# Patient Record
Sex: Female | Born: 1955
Health system: Southern US, Community
[De-identification: ages and names within clinical notes are randomized; demographics above are authoritative.]

## PROBLEM LIST (undated history)

## (undated) DIAGNOSIS — Z9889 Other specified postprocedural states: Secondary | ICD-10-CM

## (undated) DIAGNOSIS — M1990 Unspecified osteoarthritis, unspecified site: Secondary | ICD-10-CM

## (undated) DIAGNOSIS — D649 Anemia, unspecified: Secondary | ICD-10-CM

## (undated) DIAGNOSIS — E785 Hyperlipidemia, unspecified: Secondary | ICD-10-CM

## (undated) DIAGNOSIS — E079 Disorder of thyroid, unspecified: Secondary | ICD-10-CM

## (undated) DIAGNOSIS — G2581 Restless legs syndrome: Secondary | ICD-10-CM

## (undated) DIAGNOSIS — F329 Major depressive disorder, single episode, unspecified: Secondary | ICD-10-CM

## (undated) DIAGNOSIS — K219 Gastro-esophageal reflux disease without esophagitis: Secondary | ICD-10-CM

## (undated) DIAGNOSIS — M797 Fibromyalgia: Secondary | ICD-10-CM

## (undated) DIAGNOSIS — R51 Headache: Secondary | ICD-10-CM

## (undated) DIAGNOSIS — R112 Nausea with vomiting, unspecified: Secondary | ICD-10-CM

## (undated) DIAGNOSIS — F32A Depression, unspecified: Secondary | ICD-10-CM

## (undated) DIAGNOSIS — T7840XA Allergy, unspecified, initial encounter: Secondary | ICD-10-CM

## (undated) DIAGNOSIS — F419 Anxiety disorder, unspecified: Secondary | ICD-10-CM

## (undated) DIAGNOSIS — I10 Essential (primary) hypertension: Secondary | ICD-10-CM

## (undated) HISTORY — PX: OTHER SURGICAL HISTORY: SHX169

## (undated) HISTORY — PX: BLADDER SURGERY: SHX569

## (undated) HISTORY — DX: Gastro-esophageal reflux disease without esophagitis: K21.9

## (undated) HISTORY — PX: WRIST SURGERY: SHX841

## (undated) HISTORY — PX: JOINT REPLACEMENT: SHX530

## (undated) HISTORY — DX: Hyperlipidemia, unspecified: E78.5

## (undated) HISTORY — PX: SHOULDER SURGERY: SHX246

## (undated) HISTORY — DX: Disorder of thyroid, unspecified: E07.9

## (undated) HISTORY — PX: OOPHORECTOMY: SHX86

## (undated) HISTORY — DX: Allergy, unspecified, initial encounter: T78.40XA

## (undated) HISTORY — DX: Anxiety disorder, unspecified: F41.9

---
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
## 1984-06-14 HISTORY — PX: ABDOMINAL HYSTERECTOMY: SHX81

## 1992-02-26 DIAGNOSIS — K2980 Duodenitis without bleeding: Secondary | ICD-10-CM | POA: Insufficient documentation

## 1998-06-14 HISTORY — PX: KNEE ARTHROSCOPY: SUR90

## 2001-07-24 ENCOUNTER — Encounter: Admission: RE | Admit: 2001-07-24 | Discharge: 2001-07-24 | Payer: Self-pay | Admitting: Family Medicine

## 2001-07-24 ENCOUNTER — Encounter: Payer: Self-pay | Admitting: Family Medicine

## 2004-01-15 ENCOUNTER — Other Ambulatory Visit: Admission: RE | Admit: 2004-01-15 | Discharge: 2004-01-15 | Payer: Self-pay | Admitting: Internal Medicine

## 2004-06-22 ENCOUNTER — Encounter (INDEPENDENT_AMBULATORY_CARE_PROVIDER_SITE_OTHER): Payer: Self-pay | Admitting: *Deleted

## 2004-06-22 ENCOUNTER — Encounter: Admission: RE | Admit: 2004-06-22 | Discharge: 2004-06-22 | Payer: Self-pay | Admitting: Surgery

## 2004-06-23 HISTORY — PX: BREAST BIOPSY: SHX20

## 2004-12-01 ENCOUNTER — Emergency Department (HOSPITAL_COMMUNITY): Admission: EM | Admit: 2004-12-01 | Discharge: 2004-12-01 | Payer: Self-pay | Admitting: Emergency Medicine

## 2005-07-06 ENCOUNTER — Encounter: Admission: RE | Admit: 2005-07-06 | Discharge: 2005-07-06 | Payer: Self-pay | Admitting: Internal Medicine

## 2005-12-22 ENCOUNTER — Encounter: Admission: RE | Admit: 2005-12-22 | Discharge: 2005-12-22 | Payer: Self-pay | Admitting: Family Medicine

## 2006-04-19 ENCOUNTER — Encounter: Admission: RE | Admit: 2006-04-19 | Discharge: 2006-04-19 | Payer: Self-pay | Admitting: Family Medicine

## 2006-07-07 ENCOUNTER — Encounter: Admission: RE | Admit: 2006-07-07 | Discharge: 2006-07-07 | Payer: Self-pay | Admitting: Family Medicine

## 2006-07-25 ENCOUNTER — Encounter: Admission: RE | Admit: 2006-07-25 | Discharge: 2006-07-25 | Payer: Self-pay | Admitting: Family Medicine

## 2006-08-11 ENCOUNTER — Ambulatory Visit: Payer: Self-pay | Admitting: Gastroenterology

## 2006-08-25 ENCOUNTER — Ambulatory Visit: Payer: Self-pay | Admitting: Gastroenterology

## 2006-08-25 ENCOUNTER — Encounter (INDEPENDENT_AMBULATORY_CARE_PROVIDER_SITE_OTHER): Payer: Self-pay | Admitting: *Deleted

## 2006-09-05 ENCOUNTER — Ambulatory Visit: Payer: Self-pay | Admitting: Gastroenterology

## 2006-09-20 ENCOUNTER — Encounter (INDEPENDENT_AMBULATORY_CARE_PROVIDER_SITE_OTHER): Payer: Self-pay | Admitting: Specialist

## 2006-09-20 ENCOUNTER — Ambulatory Visit: Payer: Self-pay | Admitting: Gastroenterology

## 2006-10-25 ENCOUNTER — Ambulatory Visit: Payer: Self-pay | Admitting: Gastroenterology

## 2007-02-20 ENCOUNTER — Encounter: Admission: RE | Admit: 2007-02-20 | Discharge: 2007-02-20 | Payer: Self-pay | Admitting: Family Medicine

## 2007-05-22 ENCOUNTER — Ambulatory Visit: Payer: Self-pay | Admitting: Gastroenterology

## 2007-06-29 DIAGNOSIS — J309 Allergic rhinitis, unspecified: Secondary | ICD-10-CM | POA: Insufficient documentation

## 2007-06-29 DIAGNOSIS — R51 Headache: Secondary | ICD-10-CM | POA: Insufficient documentation

## 2007-06-29 DIAGNOSIS — K219 Gastro-esophageal reflux disease without esophagitis: Secondary | ICD-10-CM | POA: Insufficient documentation

## 2007-06-29 DIAGNOSIS — K449 Diaphragmatic hernia without obstruction or gangrene: Secondary | ICD-10-CM | POA: Insufficient documentation

## 2007-06-29 DIAGNOSIS — R519 Headache, unspecified: Secondary | ICD-10-CM | POA: Insufficient documentation

## 2007-06-29 DIAGNOSIS — I10 Essential (primary) hypertension: Secondary | ICD-10-CM | POA: Insufficient documentation

## 2007-06-29 DIAGNOSIS — K2990 Gastroduodenitis, unspecified, without bleeding: Secondary | ICD-10-CM

## 2007-06-29 DIAGNOSIS — F329 Major depressive disorder, single episode, unspecified: Secondary | ICD-10-CM | POA: Insufficient documentation

## 2007-06-29 DIAGNOSIS — M159 Polyosteoarthritis, unspecified: Secondary | ICD-10-CM | POA: Insufficient documentation

## 2007-06-29 DIAGNOSIS — K2970 Gastritis, unspecified, without bleeding: Secondary | ICD-10-CM | POA: Insufficient documentation

## 2007-06-29 DIAGNOSIS — K644 Residual hemorrhoidal skin tags: Secondary | ICD-10-CM | POA: Insufficient documentation

## 2007-06-29 DIAGNOSIS — E1159 Type 2 diabetes mellitus with other circulatory complications: Secondary | ICD-10-CM | POA: Insufficient documentation

## 2007-06-29 DIAGNOSIS — K589 Irritable bowel syndrome without diarrhea: Secondary | ICD-10-CM | POA: Insufficient documentation

## 2007-07-10 ENCOUNTER — Encounter: Admission: RE | Admit: 2007-07-10 | Discharge: 2007-07-10 | Payer: Self-pay | Admitting: Family Medicine

## 2007-12-13 ENCOUNTER — Emergency Department (HOSPITAL_COMMUNITY): Admission: EM | Admit: 2007-12-13 | Discharge: 2007-12-13 | Payer: Self-pay | Admitting: Physician Assistant

## 2008-05-29 DIAGNOSIS — Z860100 Personal history of colon polyps, unspecified: Secondary | ICD-10-CM | POA: Insufficient documentation

## 2008-05-29 DIAGNOSIS — M1990 Unspecified osteoarthritis, unspecified site: Secondary | ICD-10-CM | POA: Insufficient documentation

## 2008-05-30 ENCOUNTER — Ambulatory Visit: Payer: Self-pay | Admitting: Gastroenterology

## 2008-06-24 ENCOUNTER — Ambulatory Visit: Payer: Self-pay | Admitting: Family Medicine

## 2008-06-24 DIAGNOSIS — E1129 Type 2 diabetes mellitus with other diabetic kidney complication: Secondary | ICD-10-CM | POA: Insufficient documentation

## 2008-06-24 DIAGNOSIS — E119 Type 2 diabetes mellitus without complications: Secondary | ICD-10-CM | POA: Insufficient documentation

## 2008-06-24 DIAGNOSIS — IMO0001 Reserved for inherently not codable concepts without codable children: Secondary | ICD-10-CM | POA: Insufficient documentation

## 2008-06-25 LAB — CONVERTED CEMR LAB
ALT: 46 units/L — ABNORMAL HIGH (ref 0–35)
AST: 28 units/L (ref 0–37)
BUN: 20 mg/dL (ref 6–23)
Basophils Absolute: 0.1 10*3/uL (ref 0.0–0.1)
Basophils Relative: 0.8 % (ref 0.0–3.0)
Bilirubin, Direct: 0.1 mg/dL (ref 0.0–0.3)
CO2: 25 meq/L (ref 19–32)
Calcium: 10.4 mg/dL (ref 8.4–10.5)
Chloride: 101 meq/L (ref 96–112)
Creatinine, Ser: 0.8 mg/dL (ref 0.4–1.2)
Eosinophils Absolute: 0.2 10*3/uL (ref 0.0–0.7)
GFR calc non Af Amer: 79 mL/min
Glucose, Bld: 112 mg/dL — ABNORMAL HIGH (ref 70–99)
HCT: 40.8 % (ref 36.0–46.0)
LDL Cholesterol: 120 mg/dL — ABNORMAL HIGH (ref 0–99)
MCV: 86.8 fL (ref 78.0–100.0)
Microalb, Ur: 0.2 mg/dL (ref 0.0–1.9)
Neutro Abs: 4.2 10*3/uL (ref 1.4–7.7)
RBC: 4.71 M/uL (ref 3.87–5.11)
RDW: 12.9 % (ref 11.5–14.6)
Sodium: 138 meq/L (ref 135–145)
Total Bilirubin: 1 mg/dL (ref 0.3–1.2)
Total Protein: 7.9 g/dL (ref 6.0–8.3)
VLDL: 29 mg/dL (ref 0–40)

## 2008-07-17 ENCOUNTER — Encounter: Admission: RE | Admit: 2008-07-17 | Discharge: 2008-07-17 | Payer: Self-pay | Admitting: Family Medicine
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
## 2008-10-01 ENCOUNTER — Ambulatory Visit: Payer: Self-pay | Admitting: Family Medicine

## 2008-10-07 LAB — CONVERTED CEMR LAB
Cholesterol: 169 mg/dL (ref 0–200)
Hgb A1c MFr Bld: 5.3 % (ref 4.6–6.5)
Triglycerides: 154 mg/dL — ABNORMAL HIGH (ref 0.0–149.0)
VLDL: 30.8 mg/dL (ref 0.0–40.0)

## 2009-01-10 ENCOUNTER — Telehealth: Payer: Self-pay | Admitting: Family Medicine

## 2009-02-24 ENCOUNTER — Ambulatory Visit: Payer: Self-pay | Admitting: Family Medicine

## 2009-02-24 DIAGNOSIS — N390 Urinary tract infection, site not specified: Secondary | ICD-10-CM | POA: Insufficient documentation

## 2009-02-24 DIAGNOSIS — A088 Other specified intestinal infections: Secondary | ICD-10-CM | POA: Insufficient documentation

## 2009-02-24 LAB — CONVERTED CEMR LAB
Ketones, urine, test strip: NEGATIVE
Specific Gravity, Urine: >=1.03
Urobilinogen, UA: 0.2
pH: 5

## 2009-02-28 LAB — CONVERTED CEMR LAB
Albumin: 3.9 g/dL (ref 3.5–5.2)
Basophils Absolute: 0.1 10*3/uL (ref 0.0–0.1)
Bilirubin, Direct: 0.1 mg/dL (ref 0.0–0.3)
CO2: 29 meq/L (ref 19–32)
Calcium: 9.5 mg/dL (ref 8.4–10.5)
Eosinophils Absolute: 0.2 10*3/uL (ref 0.0–0.7)
Eosinophils Relative: 2.5 % (ref 0.0–5.0)
GFR calc non Af Amer: 68.93 mL/min (ref >60)
HCT: 34.3 % — ABNORMAL LOW (ref 36.0–46.0)
MCHC: 35.2 g/dL (ref 30.0–36.0)
Monocytes Relative: 7.6 % (ref 3.0–12.0)
Platelets: 164 10*3/uL (ref 150.0–400.0)
RBC: 3.89 M/uL (ref 3.87–5.11)
RDW: 12.6 % (ref 11.5–14.6)
Sodium: 144 meq/L (ref 135–145)
TSH: 1 microintl units/mL (ref 0.35–5.50)

## 2009-04-14 ENCOUNTER — Telehealth (INDEPENDENT_AMBULATORY_CARE_PROVIDER_SITE_OTHER): Payer: Self-pay | Admitting: *Deleted

## 2009-05-06 ENCOUNTER — Ambulatory Visit: Payer: Self-pay | Admitting: Family Medicine

## 2009-05-06 DIAGNOSIS — R079 Chest pain, unspecified: Secondary | ICD-10-CM | POA: Insufficient documentation

## 2009-07-21 ENCOUNTER — Telehealth: Payer: Self-pay | Admitting: Family Medicine

## 2009-08-19 ENCOUNTER — Encounter: Admission: RE | Admit: 2009-08-19 | Discharge: 2009-08-19 | Payer: Self-pay | Admitting: Family Medicine

## 2009-09-01 ENCOUNTER — Ambulatory Visit: Payer: Self-pay | Admitting: Family Medicine

## 2009-09-01 DIAGNOSIS — S6390XA Sprain of unspecified part of unspecified wrist and hand, initial encounter: Secondary | ICD-10-CM | POA: Insufficient documentation

## 2009-09-02 ENCOUNTER — Telehealth (INDEPENDENT_AMBULATORY_CARE_PROVIDER_SITE_OTHER): Payer: Self-pay

## 2009-09-05 ENCOUNTER — Encounter: Payer: Self-pay | Admitting: Family Medicine

## 2009-10-23 ENCOUNTER — Telehealth: Payer: Self-pay | Admitting: Family Medicine

## 2010-07-05 ENCOUNTER — Encounter: Payer: Self-pay | Admitting: Family Medicine

## 2010-07-14 NOTE — Progress Notes (Signed)
Summary: refill Lyrica  Phone Note Refill Request Message from:  Pharmacy on July 21, 2009 9:23 AM  Refills Requested: Medication #1:  LYRICA 150 MG CAPS 1 by mouth two times a day [facility]   Method Requested: Electronic Initial call taken by: Alfred Levins, CMA,  July 21, 2009 9:23 AM  Follow-up for Phone Call        call in #60 with 11 rf Follow-up by: Nelwyn Salisbury MD,  July 21, 2009 12:50 PM  Additional Follow-up for Phone Call Additional follow up Details #1::        Rx called to pharmacy Additional Follow-up by: Alfred Levins, CMA,  July 21, 2009 4:12 PM    Prescriptions: LYRICA 150 MG CAPS (PREGABALIN) 1 by mouth two times a day  #60 x 11   Entered by:   Alfred Levins, CMA   Authorized by:   Nelwyn Salisbury MD   Signed by:   Alfred Levins, CMA on 07/21/2009   Method used:   Telephoned to ...       [facility]  [address]  [phone]* (retail)       [address]       [place], [place]  [address]       Ph: [phone] or [phone]       Fax: [phone]   RxID:   [id number]

## 2010-07-14 NOTE — Progress Notes (Signed)
----   Converted from flag ---- ---- 09/02/2009 11:04 AM, Corky Mull wrote: Appt scheduled for 03/23 @ 9a. (Dr. Debby Bud in surgery all day today).  Pt aware.  ---- 09/02/2009 10:40 AM, Raechel Ache, RN wrote: I sent you a failrly urgent referral.  She's at home. ------------------------------
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
## 2010-07-14 NOTE — Medication Information (Signed)
Summary: Order for Diabetes Testing Supplies  Order for Diabetes Testing Supplies   Imported By: Maryln Gottron 09/10/2009 13:46:37  _____________________________________________________________________  External Attachment:    Type:   Image     Comment:   External Document

## 2010-07-14 NOTE — Progress Notes (Signed)
Summary: another Rx  Phone Note Refill Request Message from:  Fax from Pharmacy on Oct 23, 2009 9:57 AM  Triam/HCTZ 37.5-25 on back- order. May we switch?  or use Maxzide 75 1/2 once daily #15 ?   Method Requested: Fax to Local Pharmacy Initial call taken by: Raechel Ache, RN,  Oct 23, 2009 9:57 AM Caller: [facility]  [phone]*  Follow-up for Phone Call        yes, switch to 75 to take 1/2 a day as above, give 11 rf Follow-up by: Nelwyn Salisbury MD,  Oct 23, 2009 11:27 AM  Additional Follow-up for Phone Call Additional follow up Details #1::        Rx faxed to pharmacy Additional Follow-up by: Raechel Ache, RN,  Oct 23, 2009 11:34 AM    New/Updated Medications: MAXZIDE 75-50 MG TABS (TRIAMTERENE-HCTZ) 1/2 once daily Prescriptions: MAXZIDE 75-50 MG TABS (TRIAMTERENE-HCTZ) 1/2 once daily  #15 x 11   Entered by:   Raechel Ache, RN   Authorized by:   Nelwyn Salisbury MD   Signed by:   Raechel Ache, RN on 10/23/2009   Method used:   Electronically to        [facility]  [phone]* (retail)       [address]       [place], [place]  [address]       Ph: [phone] or [phone]       Fax: [phone]   RxID:   [id number]

## 2010-08-13 ENCOUNTER — Other Ambulatory Visit: Payer: Self-pay | Admitting: Family Medicine

## 2010-08-13 DIAGNOSIS — Z1231 Encounter for screening mammogram for malignant neoplasm of breast: Secondary | ICD-10-CM

## 2010-08-21 ENCOUNTER — Ambulatory Visit
Admission: RE | Admit: 2010-08-21 | Discharge: 2010-08-21 | Disposition: A | Discharge status: Home / Self Care | Payer: BC Managed Care – PPO | Source: Ambulatory Visit | Attending: Family Medicine | Admitting: Family Medicine

## 2010-08-21 DIAGNOSIS — Z1231 Encounter for screening mammogram for malignant neoplasm of breast: Secondary | ICD-10-CM

## 2010-10-19 ENCOUNTER — Other Ambulatory Visit: Payer: Self-pay | Admitting: Family Medicine

## 2010-10-19 DIAGNOSIS — I10 Essential (primary) hypertension: Secondary | ICD-10-CM

## 2010-10-27 NOTE — Assessment & Plan Note (Signed)
[facility]                         GASTROENTEROLOGY OFFICE NOTE   FAYLINN, SCHWENN                         MRN:          [id number]  DATE:05/22/2007                            DOB:          [birth year]    This is a return office visit for GERD, gastritis and irritable bowel  syndrome.  All of her symptoms are under very good control. She notes  occasional episodes of very mild diarrhea.  She states she generally  uses Robinul on a daily basis and uses a second dose if she has more  diarrhea.  She is taking Prevacid twice a day as previously recommended.   CURRENT MEDICATIONS:  Listed on the chart, updated and reviewed.   MEDICATION INTOLERANCES:  ASPIRIN LEADING TO GASTROINTESTINAL UPSET.   PHYSICAL EXAMINATION:  GENERAL:  Overweight white female in no acute  distress. Weight 228.2 pounds.  VITAL SIGNS:  Blood pressure is 112/70, pulse 68 and regular.  CHEST:  Clear to auscultation bilaterally.  CARDIAC:  Regular rate and rhythm without murmurs.  ABDOMEN:  Soft and nontender with normal active bowel sounds.   ASSESSMENT AND PLAN:  1. GERD and gastritis.  Continue Prevacid 30 mg p.o. b.i.d. taken 30      minutes before breakfast and dinner.  Maintain all standard      antireflux measures. A long term weight loss program is recommended      to be supervised by her primary physician.  2. Diarrhea predominant irritable bowel syndrome.  Symptoms under very      good control.  Continue Robinul 1 mg b.i.d. p.r.n. Avoid foods that      trigger her symptoms.  3. History of adenomatous colon polyps.  Recall colonoscopy      recommended for April, 2013.     Venita Lick. Russella Dar, MD, [facility]  Electronically Signed    MTS/MedQ  DD: 05/23/2007  DT: 05/23/2007  Job #: [id number]   cc:   Talmadge Coventry, M.D.

## 2010-10-27 NOTE — Assessment & Plan Note (Signed)
[facility]                         GASTROENTEROLOGY OFFICE NOTE   Joan Vargas, Joan Vargas                         MRN:          [id number]  DATE:10/25/2006                            DOB:          [birth year]    Joan Vargas returns with substantial improvement in her nausea and  vomiting.  She notes occasional belching and regurgitation.  Overall,  her symptoms are substantially improved on Prevacid b.i.d.  She is  having intermittent, watery, non-bloody diarrhea.  She has not used her  Robinul regularly, but it has helped when she has used it to control her  diarrhea.  Random colonic biopsies were negative, and an adenomatous  colon polyp was noted at colonoscopy.  She complains that she is still  under substantial stress, and she feels this has worsened her symptoms.   CURRENT MEDICATIONS:  Listed on the chart, updated and reviewed.   MEDICATION ALLERGIES:  ASPIRIN LEADING TO GASTROINTESTINAL UPSET.   PHYSICAL EXAMINATION:  Mildly anxious, overweight, no acute distress.  Weight 219 pounds, blood pressure 128/84, pulse 76 and regular.  She is not reexamined.   ASSESSMENT AND PLAN:  1. Gastroesophageal reflux disease and gastritis.  Continue standard      antireflux measures.  Continue Prevacid 30 mg p.o. b.i.d., taken 30      minutes before breakfast and dinner.  Return office visit 6 months.  2. Diarrhea-predominant irritable bowel syndrome.  Robinul Forte 1      b.i.d. p.r.n.  Avoid foods that trigger her symptoms.  3. Adenomatous colon polyps.  Recall colonoscopy recommended for      April, 2013.     Venita Lick. Russella Dar, MD, [facility]  Electronically Signed    MTS/MedQ  DD: 10/25/2006  DT: 10/25/2006  Job #: [id number]   cc:   Talmadge Coventry, M.D.

## 2010-10-30 NOTE — Assessment & Plan Note (Signed)
[facility]                         GASTROENTEROLOGY OFFICE NOTE   NANDA, BITTICK                         MRN:          [id number]  DATE:08/11/2006                            DOB:          12-Oct-1955    REFERRING PHYSICIAN:  Talmadge Coventry, M.D.   REASON FOR REFERRAL:  Intermittent nausea and vomiting.   HISTORY OF PRESENT ILLNESS:  Mrs. Pullin is a 55 year old white female  referred through the courtesy of Dr. Talmadge Coventry.  She was  previously evaluated by Dr. Victorino Dike in 1997 and underwent upper  endoscopy and a flexible sigmoidoscopy.  A small hiatal hernia, antral  gastritis and duodenitis were noted on upper endoscopy and areas of mild  erythema were noted on sigmoidoscopy.  It appears that she was felt to  have GERD, gastritis, duodenitis and irritable bowel syndrome.  She  relates many years of lower abdominal discomfort associated with  postprandial diarrhea alternating with normal stools, as well as gas and  bloating.  She attributes these symptoms to irritable bowel syndrome and  these symptoms have not changed for several years.  She has had  worsening problems with postprandial nausea, intermittent vomiting and a  burning upper abdominal pain for the past 4 to 5 weeks.  She states she  had an abdominal ultrasound at [facility] about 1 year ago  that was normal.  She underwent a nuclear medicine hepatobiliary scan  with ejection fraction on July 25, 2006 at [facility] which  revealed an ejection fraction of 54% and the study was felt to be  normal.  She has no dysphagia, odynophagia, melena, hematochezia, or  change in stool caliber.  She has had about a 20 pound intentional  weight loss over the past year.  She states she has intermittent  problems with hemorrhoidal swelling and discomfort.   PAST MEDICAL HISTORY:  Hypertension, osteoarthritis, depression,  headaches, allergies, status post  partial hysterectomy in 1986, status  post appendectomy 1967, status post C-section x2, status post  laparoscopic knee surgery 2003, status post laparoscopy for oophorectomy  in about 2004.   Current medications listed on the chart, updated and reviewed.   MEDICATION ALLERGIES:  Intolerance to ASPIRIN leading to GI upset.   Social history and review of systems per the handwritten form.   PHYSICAL EXAMINATION:  Overweight white female in no acute distress.  Height 5 feet 1 inch, weight 215.6 pounds.  Blood pressure is 110/72,  pulse 80 and regular.  HEENT:  Anicteric sclerae.  Oropharynx clear.  CHEST:  Clear to auscultation bilaterally.  CARDIAC:  Regular rate and rhythm without murmurs appreciated.  ABDOMEN:  Soft, with mild diffuse tenderness to deep palpation, no  rebound or guarding, no palpable organomegaly, masses or hernias,  normoactive bowel sounds.  EXTREMITIES:  Without cyanosis, clubbing or edema.  NEUROLOGIC:  Alert and oriented x3, grossly nonfocal.   ASSESSMENT AND PLAN:  1. Intermittent nausea and vomiting associated with burning upper      abdominal pain.  Rule out ulcer disease, gastritis, duodenitis, and      GERD.  Increase  Prevacid to 30 mg p.o. b.i.d. and begin standard      anti-reflux measures.  Risks, benefits and alternatives to upper      endoscopy with possible biopsy discussed with the patient and she      consents to proceed.  This will be scheduled electively.  2. Presumed irritable bowel syndrome.  Begin Robinul Forte 1 b.i.d.      p.r.n.  If she does not actively respond to Robinul, consider      further evaluation with stool hemoccults, celiac disease serology,      and colonoscopy.     Venita Lick. Russella Dar, MD, [facility]  Electronically Signed    MTS/MedQ  DD: 08/11/2006  DT: 08/11/2006  Job #: [id number]   cc:   Talmadge Coventry, M.D.

## 2010-11-26 ENCOUNTER — Telehealth: Payer: Self-pay | Admitting: *Deleted

## 2010-11-26 MED ORDER — PRAMIPEXOLE DIHYDROCHLORIDE 0.25 MG PO TABS: 0.2500 mg | ORAL_TABLET | Freq: Every day | ORAL | Status: DC

## 2010-11-26 NOTE — Telephone Encounter (Signed)
rx sent to pharmacy

## 2010-11-26 NOTE — Telephone Encounter (Signed)
Refill on mirapex 0.25

## 2010-11-26 NOTE — Telephone Encounter (Signed)
OK #60 

## 2011-04-20 ENCOUNTER — Emergency Department (HOSPITAL_COMMUNITY): Payer: BC Managed Care – PPO

## 2011-04-20 ENCOUNTER — Encounter: Payer: Self-pay | Admitting: Emergency Medicine

## 2011-04-20 ENCOUNTER — Emergency Department (HOSPITAL_COMMUNITY)
Admission: EM | Admit: 2011-04-20 | Discharge: 2011-04-21 | Disposition: A | Discharge status: Home / Self Care | Payer: BC Managed Care – PPO | Attending: Emergency Medicine | Admitting: Emergency Medicine

## 2011-04-20 DIAGNOSIS — R51 Headache: Secondary | ICD-10-CM | POA: Insufficient documentation

## 2011-04-20 DIAGNOSIS — IMO0001 Reserved for inherently not codable concepts without codable children: Secondary | ICD-10-CM | POA: Insufficient documentation

## 2011-04-20 DIAGNOSIS — E119 Type 2 diabetes mellitus without complications: Secondary | ICD-10-CM | POA: Insufficient documentation

## 2011-04-20 DIAGNOSIS — I10 Essential (primary) hypertension: Secondary | ICD-10-CM | POA: Insufficient documentation

## 2011-04-20 DIAGNOSIS — G44209 Tension-type headache, unspecified, not intractable: Secondary | ICD-10-CM | POA: Insufficient documentation

## 2011-04-20 HISTORY — DX: Fibromyalgia: M79.7

## 2011-04-20 HISTORY — DX: Depression, unspecified: F32.A

## 2011-04-20 HISTORY — DX: Restless legs syndrome: G25.81

## 2011-04-20 HISTORY — DX: Essential (primary) hypertension: I10

## 2011-04-20 HISTORY — DX: Major depressive disorder, single episode, unspecified: F32.9

## 2011-04-20 LAB — POCT I-STAT, CHEM 8
Calcium, Ion: 1.25 mmol/L (ref 1.12–1.32)
Chloride: 104 mEq/L (ref 96–112)
Glucose, Bld: 123 mg/dL — ABNORMAL HIGH (ref 70–99)
HCT: 41 % (ref 36.0–46.0)
Hemoglobin: 13.9 g/dL (ref 12.0–15.0)

## 2011-04-20 MED ORDER — OXYCODONE-ACETAMINOPHEN 5-325 MG PO TABS: 1.0000 | ORAL_TABLET | Freq: Four times a day (QID) | ORAL | Status: AC | PRN

## 2011-04-20 MED ORDER — METOCLOPRAMIDE HCL 5 MG/ML IJ SOLN
10.0000 mg | Freq: Once | INTRAMUSCULAR | Status: AC
  Administered 2011-04-20: 10 mg via INTRAVENOUS
  Filled 2011-04-20: qty 2

## 2011-04-20 MED ORDER — DIPHENHYDRAMINE HCL 50 MG/ML IJ SOLN
25.0000 mg | Freq: Once | INTRAMUSCULAR | Status: AC
  Administered 2011-04-20: 25 mg via INTRAVENOUS
  Filled 2011-04-20: qty 1

## 2011-04-20 MED ORDER — CYCLOBENZAPRINE HCL 5 MG PO TABS: 5.0000 mg | ORAL_TABLET | Freq: Three times a day (TID) | ORAL | Status: AC | PRN

## 2011-04-20 MED ORDER — DIAZEPAM 5 MG PO TABS
10.0000 mg | ORAL_TABLET | Freq: Once | ORAL | Status: AC
  Administered 2011-04-20: 10 mg via ORAL
  Filled 2011-04-20: qty 2

## 2011-04-20 NOTE — ED Notes (Signed)
Headache for the past few days now with intermit dizzyness, states that the pain radates down to her shoulder blades, nausea no vomiting

## 2011-04-20 NOTE — ED Provider Notes (Signed)
History     CSN: [id number] Arrival date & time: 04/20/2011  3:13 PM   First MD Initiated Contact with Patient 04/20/11 2059      Chief Complaint  Patient presents with  . Headache    (Consider location/radiation/quality/duration/timing/severity/associated sxs/prior treatment) HPI The patient presents with the complaint of headache. She states she woke up with a headache this morning it is worse on the right side of her head and right side of her neck. Pain is worse with movement. She has not had any vomiting or fever. She also complains of intermittent lightheadedness however she does not feel lightheaded now. Pain radiates down her neck and to her right upper back and right shoulder. She has not taken anything for the pain. The headache was present when she awoke this morning and has been gradually worsening. She has no changes in her vision or weakness of her extremities or changes in her speech. There no other associated systemic symptoms.  Past Medical History  Diagnosis Date  . Diabetes mellitus   . Hypertension   . Depression   . Fibromyalgia   . Restless leg     History reviewed. No pertinent past surgical history.  No family history on file.  History  Substance Use Topics  . Smoking status: Never Smoker   . Smokeless tobacco: Not on file  . Alcohol Use: No    OB History    Grav Para Term Preterm Abortions TAB SAB Ect Mult Living                  Review of Systems ROS reviewed and otherwise negative except for mentioned in HPI  Allergies  Aspirin  Home Medications   Current Outpatient Rx  Name Route Sig Dispense Refill  . RISAQUAD PO CAPS Oral Take 1 capsule by mouth daily.      . ASPIRIN 81 MG PO TABS Oral Take 81 mg by mouth at bedtime.      Marland Kitchen CETIRIZINE HCL 10 MG PO TABS Oral Take 10 mg by mouth at bedtime as needed. Allergies.     Marland Kitchen VITAMIN D 1000 UNITS PO TABS Oral Take 1,000 Units by mouth daily.      . DESVENLAFAXINE SUCCINATE 100 MG PO TB24  Oral Take 100 mg by mouth at bedtime.      Marland Kitchen DICLOFENAC SODIUM 1 % TD GEL Topical Apply 1 application topically as needed. Pain for thumb and knees     . FERROUS GLUCONATE IRON 246 (28 FE) MG PO TABS Oral Take 240 mg by mouth daily.      Marland Kitchen HALOBETASOL PROPIONATE 0.05 % EX CREA Topical Apply 1 application topically 2 (two) times daily.      Marland Kitchen METFORMIN HCL 500 MG PO TABS Oral Take 500 mg by mouth 2 (two) times daily with a meal.      . METOPROLOL TARTRATE 50 MG PO TABS Oral Take 50 mg by mouth daily.      . OMEGA-3-ACID ETHYL ESTERS 1 G PO CAPS Oral Take 2 g by mouth 2 (two) times daily.      Marland Kitchen PRAMIPEXOLE DIHYDROCHLORIDE 0.25 MG PO TABS Oral Take 1 tablet (0.25 mg total) by mouth at bedtime. 60 tablet 0  . PREGABALIN 150 MG PO CAPS Oral Take 150 mg by mouth 2 (two) times daily.      . TRIAMCINOLONE ACETONIDE 0.1 % EX CREA Topical Apply 1 application topically 2 (two) times daily.      . TRIAMTERENE-HCTZ 37.5-25  MG PO CAPS Oral Take 1 capsule by mouth every morning.      Marland Kitchen VITAMIN C 500 MG PO TABS Oral Take 500 mg by mouth daily.      . CYCLOBENZAPRINE HCL 5 MG PO TABS Oral Take 1 tablet (5 mg total) by mouth 3 (three) times daily as needed for muscle spasms. 20 tablet 0  . FLUTICASONE PROPIONATE 50 MCG/ACT NA SUSP Nasal Place 2 sprays into the nose daily as needed.      . OXYCODONE-ACETAMINOPHEN 5-325 MG PO TABS Oral Take 1-2 tablets by mouth every 6 (six) hours as needed for pain. 15 tablet 0    BP 149/73  Pulse 92  Temp(Src) 97.2 F (36.2 C) (Oral)  Resp 22  SpO2 99% Vitals noted Physical Exam Physical Examination: General appearance - no acute distress, tearful, anxious Mental status - alert, oriented to person, place, and time Eyes - pupils equal and reactive, extraocular eye movements intact Mouth - mucous membranes moist, pharynx normal without lesions Chest - clear to auscultation, no wheezes, rales or rhonchi, symmetric air entry Heart - normal rate, regular rhythm, normal S1,  S2, no murmurs, rubs, clicks or gallops Abdomen - soft, nontender, nondistended, no masses or organomegaly Neurological - alert, oriented, normal speech, no focal findings or movement disorder noted, cranial nerves II through XII intact, normal muscle tone, no tremors, strength 5/5, sensory intact throughout Musculoskeletal - no joint tenderness, deformity or swelling Extremities - peripheral pulses normal, no pedal edema, no clubbing or cyanosis Skin - normal coloration and turgor, no rashes, no suspicious skin lesions noted  ED Course  Procedures (including critical care time)  Labs Reviewed  POCT I-STAT, CHEM 8 - Abnormal; Notable for the following:    Creatinine, Ser 1.20 (*)    Glucose, Bld 123 (*)    All other components within normal limits  I-STAT, CHEM 8   Ct Head Wo Contrast  04/20/2011  *RADIOLOGY REPORT*  Clinical Data: Headache  CT HEAD WITHOUT CONTRAST  Technique:  Contiguous axial images were obtained from the base of the skull through the vertex without contrast.  Comparison: 12/13/2007  Findings: Mild global atrophy and chronic ischemic changes are stable.  There is no mass effect, midline shift, or acute intracranial hemorrhage.  Mastoid air cells are clear.  Paranasal sinuses are clear.  IMPRESSION: No acute intracranial pathology.  Original Report Authenticated By: Donavan Burnet, M.D.     1. Headache   2. Muscle tension headache   3. Hypertension       MDM  Pt with c/o right sided headache as well as neck aching and upper back aching- worse with movement and palpation.  Pt also concerned that her BP is elevated above its baseline.  No fever, no head trauma, neurologic exam within normal limits.  Pt feels much improved after valium for muscle relaxation as well as reglan/benadryl.  Pain is completely resolved.  Head CT and other lab workup reassuring.  Suspect tension headache.  Discharged with strict return precautions.  Pt agreeable with plan.          Ethelda Chick, MD 04/20/11 [phone]

## 2011-07-28 ENCOUNTER — Other Ambulatory Visit: Payer: Self-pay | Admitting: Family Medicine

## 2011-07-28 DIAGNOSIS — Z1231 Encounter for screening mammogram for malignant neoplasm of breast: Secondary | ICD-10-CM

## 2011-08-20 ENCOUNTER — Encounter: Payer: Self-pay | Admitting: Gastroenterology

## 2011-08-25 ENCOUNTER — Encounter: Payer: Self-pay | Admitting: Gastroenterology

## 2011-09-01 ENCOUNTER — Ambulatory Visit
Admission: RE | Admit: 2011-09-01 | Discharge: 2011-09-01 | Disposition: A | Discharge status: Home / Self Care | Payer: BC Managed Care – PPO | Source: Ambulatory Visit | Attending: Family Medicine | Admitting: Family Medicine

## 2011-09-01 DIAGNOSIS — Z1231 Encounter for screening mammogram for malignant neoplasm of breast: Secondary | ICD-10-CM

## 2011-09-17 ENCOUNTER — Other Ambulatory Visit: Payer: Self-pay | Admitting: Family Medicine

## 2011-09-17 DIAGNOSIS — Z780 Asymptomatic menopausal state: Secondary | ICD-10-CM

## 2011-09-21 ENCOUNTER — Ambulatory Visit
Admission: RE | Admit: 2011-09-21 | Discharge: 2011-09-21 | Disposition: A | Discharge status: Home / Self Care | Payer: BC Managed Care – PPO | Source: Ambulatory Visit | Attending: Family Medicine | Admitting: Family Medicine

## 2011-09-21 DIAGNOSIS — Z780 Asymptomatic menopausal state: Secondary | ICD-10-CM

## 2011-10-14 ENCOUNTER — Ambulatory Visit (AMBULATORY_SURGERY_CENTER): Payer: BC Managed Care – PPO | Admitting: *Deleted

## 2011-10-14 VITALS — Ht 61.0 in | Wt 234.2 lb

## 2011-10-14 DIAGNOSIS — Z1211 Encounter for screening for malignant neoplasm of colon: Secondary | ICD-10-CM

## 2011-10-14 MED ORDER — PEG-KCL-NACL-NASULF-NA ASC-C 100 G PO SOLR: ORAL | Status: DC

## 2011-10-15 ENCOUNTER — Encounter: Payer: Self-pay | Admitting: Gastroenterology

## 2011-10-28 ENCOUNTER — Ambulatory Visit (AMBULATORY_SURGERY_CENTER): Payer: BC Managed Care – PPO | Admitting: Gastroenterology

## 2011-10-28 ENCOUNTER — Encounter: Payer: Self-pay | Admitting: Gastroenterology

## 2011-10-28 VITALS — HR 76 | Temp 95.9°F | Resp 17 | Ht 61.0 in | Wt 234.0 lb

## 2011-10-28 DIAGNOSIS — Z1211 Encounter for screening for malignant neoplasm of colon: Secondary | ICD-10-CM

## 2011-10-28 DIAGNOSIS — Z86010 Personal history of colonic polyps: Secondary | ICD-10-CM

## 2011-10-28 MED ORDER — SODIUM CHLORIDE 0.9 % IV SOLN: 500.0000 mL | INTRAVENOUS | Status: DC

## 2011-10-28 NOTE — Progress Notes (Signed)
The pt tolerated the colonoscopy very well. Maw   

## 2011-10-28 NOTE — Op Note (Signed)
[facility] [address] [place], [place]  [address]  COLONOSCOPY PROCEDURE REPORT  PATIENT:  Joan, Vargas  MR#:  [id number] BIRTHDATE:  08/03/1955, 56 yrs. old  GENDER:  female ENDOSCOPIST:  Judie Petit T. Russella Dar, MD, [facility]  PROCEDURE DATE:  10/28/2011 PROCEDURE:  Colonoscopy 42595 ASA CLASS:  Class II INDICATIONS:  1) surveillance and high-risk screening  2) history of pre-cancerous (adenomatous) colon polyps: 09/2006 MEDICATIONS:   These medications were titrated to patient response per physician's verbal order, Fentanyl 50 mcg IV, Versed 5 mg IV DESCRIPTION OF PROCEDURE:   After the risks benefits and alternatives of the procedure were thoroughly explained, informed consent was obtained.  Digital rectal exam was performed and revealed no abnormalities.   The LB [id number] endoscope was introduced through the anus and advanced to the cecum, which was identified by both the appendix and ileocecal valve, without limitations.  The quality of the prep was adequate, using MoviPrep.  The instrument was then slowly withdrawn as the colon was fully examined. <<PROCEDUREIMAGES>> FINDINGS:  A normal appearing cecum, ileocecal valve, and appendiceal orifice were identified. The ascending, hepatic flexure, transverse, splenic flexure, descending, sigmoid colon, and rectum appeared unremarkable. Retroflexed views in the rectum revealed no abnormalities.  The time to cecum =  2  minutes. The scope was then withdrawn (time =  10.25  min) from the patient and the procedure completed.  COMPLICATIONS:  None  ENDOSCOPIC IMPRESSION: 1) Normal colon  RECOMMENDATIONS: 1) High fiber diet with liberal fluid intake. 2) Repeat Colonoscopy in 5 years.  Venita Lick. Russella Dar, MD, [facility]  n. eSIGNED:   Venita Lick. [employer] at 10/28/2011 09:34 AM  Jarvis Morgan, [phone]

## 2011-10-28 NOTE — Patient Instructions (Signed)
YOU HAD AN ENDOSCOPIC PROCEDURE TODAY AT THE [facility]: Refer to the procedure report that was given to you for any specific questions about what was found during the examination.  If the procedure report does not answer your questions, please call your gastroenterologist to clarify.  If you requested that your care partner not be given the details of your procedure findings, then the procedure report has been included in a sealed envelope for you to review at your convenience later.  YOU SHOULD EXPECT: Some feelings of bloating in the abdomen. Passage of more gas than usual.  Walking can help get rid of the air that was put into your GI tract during the procedure and reduce the bloating. If you had a lower endoscopy (such as a colonoscopy or flexible sigmoidoscopy) you may notice spotting of blood in your stool or on the toilet paper. If you underwent a bowel prep for your procedure, then you may not have a normal bowel movement for a few days.  DIET: Your first meal following the procedure should be a light meal and then it is ok to progress to your normal diet.  A half-sandwich or bowl of soup is an example of a good first meal.  Heavy or fried foods are harder to digest and may make you feel nauseous or bloated.  Likewise meals heavy in dairy and vegetables can cause extra gas to form and this can also increase the bloating.  Drink plenty of fluids but you should avoid alcoholic beverages for 24 hours.  ACTIVITY: Your care partner should take you home directly after the procedure.  You should plan to take it easy, moving slowly for the rest of the day.  You can resume normal activity the day after the procedure however you should NOT DRIVE or use heavy machinery for 24 hours (because of the sedation medicines used during the test).    SYMPTOMS TO REPORT IMMEDIATELY: A gastroenterologist can be reached at any hour.  During normal business hours, 8:30 AM to 5:00 PM Monday through Friday,  call [phone].  After hours and on weekends, please call the GI answering service at [phone] who will take a message and have the physician on call contact you.   Following lower endoscopy (colonoscopy or flexible sigmoidoscopy):  Excessive amounts of blood in the stool  Significant tenderness or worsening of abdominal pains  Swelling of the abdomen that is new, acute  Fever of 100F or higher    FOLLOW UP: If any biopsies were taken you will be contacted by phone or by letter within the next 1-3 weeks.  Call your gastroenterologist if you have not heard about the biopsies in 3 weeks.  Our staff will call the home number listed on your records the next business day following your procedure to check on you and address any questions or concerns that you may have at that time regarding the information given to you following your procedure. This is a courtesy call and so if there is no answer at the home number and we have not heard from you through the emergency physician on call, we will assume that you have returned to your regular daily activities without incident.  SIGNATURES/CONFIDENTIALITY: You and/or your care partner have signed paperwork which will be entered into your electronic medical record.  These signatures attest to the fact that that the information above on your After Visit Summary has been reviewed and is understood.  Full responsibility of the confidentiality   of this discharge information lies with you and/or your care-partner.     

## 2011-10-28 NOTE — Progress Notes (Signed)
The pt's face is flushed looking pre-sedation. Maw

## 2011-10-29 ENCOUNTER — Telehealth: Payer: Self-pay

## 2011-10-29 NOTE — Telephone Encounter (Signed)
  Follow up Call-  Call back number 10/28/2011  Post procedure Call Back phone  # [phone]  Permission to leave phone message Yes     Patient questions:  Do you have a fever, pain , or abdominal swelling? no Pain Score  0 *  Have you tolerated food without any problems? yes  Have you been able to return to your normal activities? yes  Do you have any questions about your discharge instructions: Diet   no Medications  no Follow up visit  no  Do you have questions or concerns about your Care? no  Actions: * If pain score is 4 or above: No action needed, pain <4.  Per the pt "I did fine". Maw

## 2011-11-02 ENCOUNTER — Ambulatory Visit (INDEPENDENT_AMBULATORY_CARE_PROVIDER_SITE_OTHER): Payer: BC Managed Care – PPO | Admitting: Surgery

## 2011-11-02 ENCOUNTER — Other Ambulatory Visit (INDEPENDENT_AMBULATORY_CARE_PROVIDER_SITE_OTHER): Payer: Self-pay

## 2011-11-02 ENCOUNTER — Telehealth (INDEPENDENT_AMBULATORY_CARE_PROVIDER_SITE_OTHER): Payer: Self-pay | Admitting: Surgery

## 2011-11-02 ENCOUNTER — Encounter (INDEPENDENT_AMBULATORY_CARE_PROVIDER_SITE_OTHER): Payer: Self-pay | Admitting: Surgery

## 2011-11-02 VITALS — BP 144/78 | HR 104 | Temp 96.8°F | Resp 20 | Ht 61.0 in | Wt 226.0 lb

## 2011-11-02 DIAGNOSIS — E049 Nontoxic goiter, unspecified: Secondary | ICD-10-CM

## 2011-11-02 DIAGNOSIS — E040 Nontoxic diffuse goiter: Secondary | ICD-10-CM

## 2011-11-02 NOTE — Progress Notes (Signed)
Chief Complaint  Patient presents with  . New Evaluation    thyroid nodule - referral from Dr. Maryelizabeth Rowan    HISTORY: The patient is a 56 year old white female referred by her primary care physician for her newly diagnosed thyroid goiter. The patient was found on physical examination by her primary care physician to have an asymmetrically enlarged thyroid gland. Right lobe was noted to be markedly larger than the left. Patient complained of some minor pressure sensation. She has no significant dysphagia. Patient has never been on thyroid medication. She has had no prior biopsies.  There is no significant family history for endocrine disease. Patient has never had surgery on the head or neck. She has never been on thyroid medication.  Thyroid ultrasound was obtained at [facility] on September 21, 2011.  The right lobe is markedly enlarged at 3.8 x 5.3 x 6.4 cm. Left lobe was also enlarged at 2.2 x 2.4 x 5.6 cm. There are complex cyst and nodules throughout the gland which is extremely heterogeneous.  Past Medical History  Diagnosis Date  . Diabetes mellitus   . Hypertension   . Depression   . Fibromyalgia   . Restless leg      Current Outpatient Prescriptions  Medication Sig Dispense Refill  . aspirin 81 MG tablet Take 81 mg by mouth at bedtime.        Marland Kitchen BAYER BREEZE 2 TEST DISK       . cetirizine (ZYRTEC) 10 MG tablet Take 10 mg by mouth at bedtime as needed. Allergies.       . cholecalciferol (VITAMIN D) 1000 UNITS tablet Take 1,000 Units by mouth daily.        Marland Kitchen CINNAMON PO Take by mouth daily.      . cyclobenzaprine (FLEXERIL) 10 MG tablet Take 10 mg by mouth as needed.      . diclofenac sodium (VOLTAREN) 1 % GEL Apply 1 application topically as needed. Pain for thumb and knees       . estradiol (ESTRACE) 0.1 MG/GM vaginal cream Place 2 g vaginally daily.      . fluticasone (FLOVENT DISKUS) 50 MCG/BLIST diskus inhaler Inhale 1 puff into the lungs 2 (two) times daily.      .  Ginger, Zingiber officinalis, (GINGER PO) Take by mouth daily.      . Glucosamine-Chondroitin (MOVE FREE PO) Take by mouth.      . halobetasol (ULTRAVATE) 0.05 % cream Apply 1 application topically 2 (two) times daily.        Marland Kitchen HYDROcodone-acetaminophen (NORCO) 5-325 MG per tablet Take 1 tablet by mouth every 6 (six) hours as needed.      Marland Kitchen losartan (COZAAR) 100 MG tablet Take 100 mg by mouth daily.       . metFORMIN (GLUCOPHAGE) 500 MG tablet Take 500 mg by mouth 2 (two) times daily with a meal.        . omega-3 acid ethyl esters (LOVAZA) 1 G capsule Take 2 g by mouth 2 (two) times daily.        Marland Kitchen acidophilus (RISAQUAD) CAPS Take 1 capsule by mouth daily.        Marland Kitchen desvenlafaxine (PRISTIQ) 100 MG 24 hr tablet Take 100 mg by mouth at bedtime.        . ferrous gluconate (FERGON) 246 (28 FE) MG tablet Take 240 mg by mouth daily.        . fluticasone (FLONASE) 50 MCG/ACT nasal spray Place 2 sprays into the  nose daily as needed.        . metoprolol (LOPRESSOR) 50 MG tablet Take 50 mg by mouth daily.        . Multiple Vitamin (MULTIVITAMIN) tablet Take 1 tablet by mouth daily.      . pramipexole (MIRAPEX) 0.25 MG tablet Take 1 tablet (0.25 mg total) by mouth at bedtime.  60 tablet  0  . pregabalin (LYRICA) 150 MG capsule Take 150 mg by mouth 2 (two) times daily.        Marland Kitchen triamcinolone (KENALOG) 0.1 % cream Apply 1 application topically 2 (two) times daily.        Marland Kitchen triamterene-hydrochlorothiazide (DYAZIDE) 37.5-25 MG per capsule Take 1 capsule by mouth every morning.        . vitamin C (ASCORBIC ACID) 500 MG tablet Take 500 mg by mouth daily.         Current Facility-Administered Medications  Medication Dose Route Frequency Provider Last Rate Last Dose  . 0.9 %  sodium chloride infusion  500 mL Intravenous Continuous Meryl Dare, MD,FACG         Allergies  Allergen Reactions  . Aspirin Nausea And Vomiting    REACTION: stomach upset     Family History  Problem Relation Age of Onset  .  Colon cancer Neg Hx   . Stomach cancer Neg Hx   . Diabetes Mother      History   Social History  . Marital Status: Married    Spouse Name: N/A    Number of Children: N/A  . Years of Education: N/A   Social History Main Topics  . Smoking status: Never Smoker   . Smokeless tobacco: Never Used  . Alcohol Use: No  . Drug Use: No  . Sexually Active: None   Other Topics Concern  . None   Social History Narrative  . None     REVIEW OF SYSTEMS - PERTINENT POSITIVES ONLY: Denies tremors. He denies palpitations. Mild dysphagia. Moderate pressure sensation right lobe.  EXAM: Filed Vitals:   11/02/11 0942  BP: 144/78  Pulse: 104  Temp: 96.8 F (36 C)  Resp: 20    HEENT: normocephalic; pupils equal and reactive; sclerae clear; dentition good; mucous membranes moist NECK:  Palpation reveals a markedly enlarged thyroid gland which is quite firm. Right lobe is markedly enlarged and actually causes tracheal deviation to the left. There is moderate tenderness over the right lobe. Left lobe is firm but not tender. No palpable lymphadenopathy; asymmetric on extension; no palpable anterior or posterior cervical lymphadenopathy; no supraclavicular masses; no tenderness CHEST: clear to auscultation bilaterally without rales, rhonchi, or wheezes CARDIAC: regular rate and rhythm without significant murmur; peripheral pulses are full EXT:  non-tender without edema; no deformity NEURO: no gross focal deficits; no sign of tremor   LABORATORY RESULTS: See [facility] (CHL-Epic) for most recent results   RADIOLOGY RESULTS: See [facility] (CHL-Epic) for most recent results   IMPRESSION: Asymmetric thyroid goiter with tracheal deviation, mild compressive symptoms  PLAN: The patient and I discussed the options for management. Given her mild compressive symptoms and her significant tracheal deviation, I believe she is a good candidate for total thyroidectomy. We discussed the  procedure at length. We discussed the risk and benefits including the possibility of recurrent laryngeal nerve injury and injury to parathyroid glands. We discussed the hospital stay. We discussed the location of the surgical incision. We discussed her postoperative recovery. She understands and wishes to proceed in  the near future. We will make these arrangements.  The risks and benefits of the procedure have been discussed at length with the patient.  The patient understands the proposed procedure, potential alternative treatments, and the course of recovery to be expected.  All of the patient's questions have been answered at this time.  The patient wishes to proceed with surgery.  Velora Heckler, MD, FACS General & Endocrine Surgery [facility]   Visit Diagnoses: 1. Goiter diffuse, right thyroid lobe     Primary Care Physician: Maryelizabeth Rowan, MD, MD

## 2011-11-02 NOTE — Patient Instructions (Signed)
Thyroid Diseases Your thyroid is a butterfly-shaped gland in your neck. It is located just above your collarbone. It is one of your endocrine glands, which make hormones. The thyroid helps set your metabolism. Metabolism is how your body gets energy from the foods you eat.  Millions of people have thyroid diseases. Women experience thyroid problems more often than men. In fact, overactive thyroid problems (hyperthyroidism) occur in 1% of all women. If you have a thyroid disease, your body may use energy more slowly or quickly than it should.  Thyroid problems also include an immune disease where your body reacts against your thyroid gland (called thyroiditis). A different problem involves lumps and bumps (called nodules) that develop in the gland. The nodules are usually, but not always, noncancerous. THE MOST COMMON THYROID PROBLEMS AND CAUSES ARE DISCUSSED BELOW There are many causes for thyroid problems. Treatment depends upon the exact diagnosis and includes trying to reset your body's metabolism to a normal rate. Hyperthyroidism Too much thyroid hormone from an overactive thyroid gland is called hyperthyroidism. In hyperthyroidism, the body's metabolism speeds up. One of the most frequent forms of hyperthyroidism is known as Graves' disease. Graves' disease tends to run in families. Although Graves' is thought to be caused by a problem with the immune system, the exact nature of the genetic problem is unknown. Hypothyroidism Too little thyroid hormone from an underactive thyroid gland is called hypothyroidism. In hypothyroidism, the body's metabolism is slowed. Several things can cause this condition. Most causes affect the thyroid gland directly and hurt its ability to make enough hormone.  Rarely, there may be a pituitary gland tumor (located near the base of the brain). The tumor can block the pituitary from producing thyroid-stimulating hormone (TSH). Your body makes TSH to stimulate the thyroid  to work properly. If the pituitary does not make enough TSH, the thyroid fails to make enough hormones needed for good health. Whether the problem is caused by thyroid conditions or by the pituitary gland, the result is that the thyroid is not making enough hormones. Hypothyroidism causes many physical and mental processes to become sluggish. The body consumes less oxygen and produces less body heat. Thyroid Nodules A thyroid nodule is a small swelling or lump in the thyroid gland. They are common. These nodules represent either a growth of thyroid tissue or a fluid-filled cyst. Both form a lump in the thyroid gland. Almost half of all people will have tiny thyroid nodules at some point in their lives. Typically, these are not noticeable until they become large and affect normal thyroid size. Larger nodules that are greater than a half inch across (about 1 centimeter) occur in about 5 percent of people. Although most nodules are not cancerous, people who have them should seek medical care to rule out cancer. Also, some thyroid nodules may produce too much thyroid hormone or become too large. Large nodules or a large gland can interfere with breathing or swallowing or may cause neck discomfort. Other problems Other thyroid problems include cancer and thyroiditis. Thyroiditis is a malfunction of the body's immune system. Normally, the immune system works to defend the body against infection and other problems. When the immune system is not working properly, it may mistakenly attack normal cells, tissues, and organs. Examples of autoimmune diseases are Hashimoto's thyroiditis (which causes low thyroid function) and Graves' disease (which causes excess thyroid function). SYMPTOMS  Symptoms vary greatly depending upon the exact type of problem with the thyroid. Hyperthyroidism-is when your thyroid is too   active and makes more thyroid hormone than your body needs. The most common cause is Graves' Disease. Too  much thyroid hormone can cause some or all of the following symptoms:  Anxiety.   Irritability.   Difficulty sleeping.   Fatigue.   A rapid or irregular heartbeat.   A fine tremor of your hands or fingers.   An increase in perspiration.   Sensitivity to heat.   Weight loss, despite normal food intake.   Brittle hair.   Enlargement of your thyroid gland (goiter).   Light menstrual periods.   Frequent bowel movements.  Graves' disease can specifically cause eye and skin problems. The skin problems involve reddening and swelling of the skin, often on your shins and on the top of your feet. Eye problems can include the following:  Excess tearing and sensation of grit or sand in either or both eyes.   Reddened or inflamed eyes.   Widening of the space between your eyelids.   Swelling of the lids and tissues around the eyes.   Light sensitivity.   Ulcers on the cornea.   Double vision.   Limited eye movements.   Blurred or reduced vision.  Hypothyroidism- is when your thyroid gland is not active enough. This is more common than hyperthyroidism. Symptoms can vary a lot depending of the severity of the hormone deficiency. Symptoms may develop over a long period of time and can include several of the following:  Fatigue.   Sluggishness.   Increased sensitivity to cold.   Constipation.   Pale, dry skin.   A puffy face.   Hoarse voice.   High blood cholesterol level.   Unexplained weight gain.   Muscle aches, tenderness and stiffness.   Pain, stiffness or swelling in your joints.   Muscle weakness.   Heavier than normal menstrual periods.   Brittle fingernails and hair.   Depression.  Thyroid Nodules - most do not cause signs or symptoms. Occasionally, some may become so large that you can feel or even see the swelling at the base of your neck. You may realize a lump or swelling is there when you are shaving or putting on makeup. Men might become  aware of a nodule when shirt collars suddenly feel too tight. Some nodules produce too much thyroid hormone. This can produce the same symptoms as hyperthyroidism (see above). Thyroid nodules are seldom cancerous. However, a nodule is more likely to be malignant (cancerous) if it:  Grows quickly or feels hard.   Causes you to become hoarse or to have trouble swallowing or breathing.   Causes enlarged lymph nodes under your jaw or in your neck.  DIAGNOSIS  Because there are so many possible thyroid conditions, your caregiver may ask for a number of tests. They will do this in order to narrow down the exact diagnosis. These tests can include:  Blood and antibody tests.   Special thyroid scans using small, safe amounts of radioactive iodine.   Ultrasound of the thyroid gland (particularly if there is a nodule or lump).   Biopsy. This is usually done with a special needle. A needle biopsy is a procedure to obtain a sample of cells from the thyroid. The tissue will be tested in a lab and examined under a microscope.  TREATMENT  Treatment depends on the exact diagnosis. Hyperthyroidism  Beta-blockers help relieve many of the symptoms.   Anti-thyroid medications prevent the thyroid from making excess hormones.   Radioactive iodine treatment can destroy overactive thyroid   cells. The iodine can permanently decrease the amount of hormone produced.   Surgery to remove the thyroid gland.   Treatments for eye problems that come from Graves' disease also include medications and special eye surgery, if felt to be appropriate.  Hypothyroidism Thyroid replacement with levothyroxine is the mainstay of treatment. Treatment with thyroid replacement is usually lifelong and will require monitoring and adjustment from time to time. Thyroid Nodules  Watchful waiting. If a small nodule causes no symptoms or signs of cancer on biopsy, then no treatment may be chosen at first. Re-exam and re-checking blood  tests would be the recommended follow-up.   Anti-thyroid medications or radioactive iodine treatment may be recommended if the nodules produce too much thyroid hormone (see Treatment for Hyperthyroidism above).   Alcohol ablation. Injections of small amounts of ethyl alcohol (ethanol) can cause a non-cancerous nodule to shrink in size.   Surgery (see Treatment for Hyperthyroidism above).  HOME CARE INSTRUCTIONS   Take medications as instructed.   Follow through on recommended testing.  SEEK MEDICAL CARE IF:   You feel that you are developing symptoms of Hyperthyroidism or Hypothyroidism as described above.   You develop a new lump/nodule in the neck/thyroid area that you had not noticed before.   You feel that you are having side effects from medicines prescribed.   You develop trouble breathing or swallowing.  SEEK IMMEDIATE MEDICAL CARE IF:   You develop a fever of 102 F (38.9 C) or higher.   You develop severe sweating.   You develop palpitations and/or rapid heart beat.   You develop shortness of breath.   You develop nausea and vomiting.   You develop extreme shakiness.   You develop agitation.   You develop lightheadedness or have a fainting episode.  Document Released: 03/28/2007 Document Revised: 05/20/2011 Document Reviewed: 03/28/2007 Exit[employer] Patient Information 2012 [employer]. 

## 2011-11-02 NOTE — Telephone Encounter (Signed)
PO appt made. Card and labs slip for calcium prior to appt mailed to pt.

## 2011-11-03 ENCOUNTER — Encounter (HOSPITAL_COMMUNITY)
Admission: RE | Admit: 2011-11-03 | Discharge: 2011-11-03 | Disposition: A | Discharge status: Home / Self Care | Payer: BC Managed Care – PPO | Source: Ambulatory Visit | Attending: Surgery | Admitting: Surgery

## 2011-11-03 ENCOUNTER — Ambulatory Visit (HOSPITAL_COMMUNITY)
Admission: RE | Admit: 2011-11-03 | Discharge: 2011-11-03 | Disposition: A | Discharge status: Home / Self Care | Payer: BC Managed Care – PPO | Source: Ambulatory Visit | Attending: Surgery | Admitting: Surgery

## 2011-11-03 ENCOUNTER — Encounter (HOSPITAL_COMMUNITY): Payer: Self-pay

## 2011-11-03 DIAGNOSIS — E049 Nontoxic goiter, unspecified: Secondary | ICD-10-CM | POA: Insufficient documentation

## 2011-11-03 DIAGNOSIS — Z01812 Encounter for preprocedural laboratory examination: Secondary | ICD-10-CM | POA: Insufficient documentation

## 2011-11-03 DIAGNOSIS — Z01818 Encounter for other preprocedural examination: Secondary | ICD-10-CM | POA: Insufficient documentation

## 2011-11-03 DIAGNOSIS — Z01810 Encounter for preprocedural cardiovascular examination: Secondary | ICD-10-CM | POA: Insufficient documentation

## 2011-11-03 HISTORY — DX: Anemia, unspecified: D64.9

## 2011-11-03 HISTORY — DX: Unspecified osteoarthritis, unspecified site: M19.90

## 2011-11-03 HISTORY — DX: Headache: R51

## 2011-11-03 LAB — BASIC METABOLIC PANEL
BUN: 18 mg/dL (ref 6–23)
CO2: 29 mEq/L (ref 19–32)
Chloride: 97 mEq/L (ref 96–112)
GFR calc Af Amer: >90 mL/min (ref >90)
Potassium: 3.9 mEq/L (ref 3.5–5.1)

## 2011-11-03 LAB — SURGICAL PCR SCREEN: Staphylococcus aureus: NEGATIVE

## 2011-11-03 LAB — CBC
HCT: 40 % (ref 36.0–46.0)
Hemoglobin: 13.7 g/dL (ref 12.0–15.0)
MCV: 84.7 fL (ref 78.0–100.0)
RBC: 4.72 MIL/uL (ref 3.87–5.11)
RDW: 13.6 % (ref 11.5–15.5)
WBC: 8.6 10*3/uL (ref 4.0–10.5)

## 2011-11-03 NOTE — Patient Instructions (Signed)
20 Joan Vargas  11/03/2011   Your procedure is scheduled on:  11/04/11 1000-1140am  Report to [facility] at 0730 AM.  Call this number if you have problems the morning of surgery: [phone]   Remember:   Do not eat food:After Midnight.  May have clear liquids:until Midnight .   Take these medicines the morning of surgery with A SIP OF WATER:    Do not wear jewelry, make-up or nail polish.  Do not wear lotions, powders, or perfumes. .  Do not shave 48 hours prior to surgery.   Do not bring valuables to the hospital.  Contacts, dentures or bridgework may not be worn into surgery.  Leave suitcase in the car. After surgery it may be brought to your room.  For patients admitted to the hospital, checkout time is 11:00 AM the day of discharge.   Special Instructions: CHG Shower Use Special Wash: 1/2 bottle night before surgery and 1/2 bottle morning of surgery. shower chin to toes with CHG.  Wash face and private parts with regular soap.    Please read over the following fact sheets that you were given: MRSA Information, coughing and deep breathing exercises, leg exercises

## 2011-11-03 NOTE — Progress Notes (Signed)
11/03/11 1453  OBSTRUCTIVE SLEEP APNEA  Have you ever been diagnosed with sleep apnea through a sleep study? No  Do you snore loudly (loud enough to be heard through closed doors)?  0  Do you often feel tired, fatigued, or sleepy during the daytime? 1  Has anyone observed you stop breathing during your sleep? 0  Do you have, or are you being treated for high blood pressure? 1  BMI more than 35 kg/m2? 1  Age over 58 years old? 1  Gender: 0  Obstructive Sleep Apnea Score 4   Score 4 or greater  Updated health history

## 2011-11-04 ENCOUNTER — Ambulatory Visit (HOSPITAL_COMMUNITY): Payer: BC Managed Care – PPO | Admitting: Anesthesiology

## 2011-11-04 ENCOUNTER — Encounter (HOSPITAL_COMMUNITY): Payer: Self-pay | Admitting: *Deleted

## 2011-11-04 ENCOUNTER — Encounter (HOSPITAL_COMMUNITY)
Admission: RE | Disposition: A | Discharge status: Home / Self Care | Payer: Self-pay | Source: Ambulatory Visit | Attending: Surgery

## 2011-11-04 ENCOUNTER — Ambulatory Visit (HOSPITAL_COMMUNITY)
Admission: RE | Admit: 2011-11-04 | Discharge: 2011-11-05 | Disposition: A | Discharge status: Home / Self Care | Payer: BC Managed Care – PPO | Source: Ambulatory Visit | Attending: Surgery | Admitting: Surgery

## 2011-11-04 ENCOUNTER — Encounter (HOSPITAL_COMMUNITY): Payer: Self-pay | Admitting: Anesthesiology

## 2011-11-04 DIAGNOSIS — Z01810 Encounter for preprocedural cardiovascular examination: Secondary | ICD-10-CM | POA: Insufficient documentation

## 2011-11-04 DIAGNOSIS — I10 Essential (primary) hypertension: Secondary | ICD-10-CM | POA: Insufficient documentation

## 2011-11-04 DIAGNOSIS — E119 Type 2 diabetes mellitus without complications: Secondary | ICD-10-CM | POA: Insufficient documentation

## 2011-11-04 DIAGNOSIS — Z01812 Encounter for preprocedural laboratory examination: Secondary | ICD-10-CM | POA: Insufficient documentation

## 2011-11-04 DIAGNOSIS — Z7982 Long term (current) use of aspirin: Secondary | ICD-10-CM | POA: Insufficient documentation

## 2011-11-04 DIAGNOSIS — E049 Nontoxic goiter, unspecified: Secondary | ICD-10-CM | POA: Insufficient documentation

## 2011-11-04 DIAGNOSIS — E040 Nontoxic diffuse goiter: Secondary | ICD-10-CM

## 2011-11-04 DIAGNOSIS — J398 Other specified diseases of upper respiratory tract: Secondary | ICD-10-CM | POA: Insufficient documentation

## 2011-11-04 DIAGNOSIS — E042 Nontoxic multinodular goiter: Secondary | ICD-10-CM

## 2011-11-04 DIAGNOSIS — Z79899 Other long term (current) drug therapy: Secondary | ICD-10-CM | POA: Insufficient documentation

## 2011-11-04 HISTORY — PX: THYROIDECTOMY: SHX17

## 2011-11-04 LAB — GLUCOSE, CAPILLARY: Glucose-Capillary: 225 mg/dL — ABNORMAL HIGH (ref 70–99)

## 2011-11-04 SURGERY — THYROIDECTOMY
Anesthesia: General | Wound class: Clean

## 2011-11-04 MED ORDER — ACETAMINOPHEN 10 MG/ML IV SOLN
INTRAVENOUS | Status: DC | PRN
  Administered 2011-11-04: 1000 mg via INTRAVENOUS

## 2011-11-04 MED ORDER — INSULIN ASPART 100 UNIT/ML ~~LOC~~ SOLN
3.0000 [IU] | Freq: Once | SUBCUTANEOUS | Status: AC
  Administered 2011-11-04: 3 [IU] via SUBCUTANEOUS

## 2011-11-04 MED ORDER — FENTANYL CITRATE 0.05 MG/ML IJ SOLN
INTRAMUSCULAR | Status: DC | PRN
  Administered 2011-11-04 (×4): 50 ug via INTRAVENOUS

## 2011-11-04 MED ORDER — FENTANYL CITRATE 0.05 MG/ML IJ SOLN
INTRAMUSCULAR | Status: AC
  Filled 2011-11-04: qty 2

## 2011-11-04 MED ORDER — HYDROCHLOROTHIAZIDE 25 MG PO TABS
25.0000 mg | ORAL_TABLET | Freq: Every day | ORAL | Status: DC
  Administered 2011-11-04 – 2011-11-05 (×2): 25 mg via ORAL
  Filled 2011-11-04 (×2): qty 1

## 2011-11-04 MED ORDER — LOSARTAN POTASSIUM 50 MG PO TABS
100.0000 mg | ORAL_TABLET | Freq: Every day | ORAL | Status: DC
  Administered 2011-11-04 – 2011-11-05 (×2): 100 mg via ORAL
  Filled 2011-11-04 (×2): qty 2

## 2011-11-04 MED ORDER — PROPOFOL 10 MG/ML IV EMUL
INTRAVENOUS | Status: DC | PRN
  Administered 2011-11-04: 200 mg via INTRAVENOUS

## 2011-11-04 MED ORDER — ONDANSETRON HCL 4 MG/2ML IJ SOLN
INTRAMUSCULAR | Status: DC | PRN
  Administered 2011-11-04: 4 mg via INTRAVENOUS

## 2011-11-04 MED ORDER — PROMETHAZINE HCL 25 MG/ML IJ SOLN: 12.5000 mg | Freq: Four times a day (QID) | INTRAMUSCULAR | Status: DC | PRN

## 2011-11-04 MED ORDER — CEFAZOLIN SODIUM-DEXTROSE 2-3 GM-% IV SOLR
INTRAVENOUS | Status: AC
  Filled 2011-11-04: qty 50

## 2011-11-04 MED ORDER — METOPROLOL TARTRATE 25 MG PO TABS
25.0000 mg | ORAL_TABLET | Freq: Every day | ORAL | Status: DC
Start: 2011-11-04 — End: 2011-11-05
  Administered 2011-11-05: 25 mg via ORAL
  Filled 2011-11-04 (×2): qty 1

## 2011-11-04 MED ORDER — ACETAMINOPHEN 10 MG/ML IV SOLN
INTRAVENOUS | Status: AC
  Filled 2011-11-04: qty 100

## 2011-11-04 MED ORDER — METOPROLOL TARTRATE 50 MG PO TABS
50.0000 mg | ORAL_TABLET | Freq: Every morning | ORAL | Status: DC
Start: 2011-11-04 — End: 2011-11-04
  Administered 2011-11-04: 50 mg via ORAL
  Filled 2011-11-04: qty 1

## 2011-11-04 MED ORDER — CEFAZOLIN SODIUM 1-5 GM-% IV SOLN
INTRAVENOUS | Status: DC | PRN
  Administered 2011-11-04: 2 g via INTRAVENOUS

## 2011-11-04 MED ORDER — LOSARTAN POTASSIUM-HCTZ 100-25 MG PO TABS: 1.0000 | ORAL_TABLET | Freq: Every day | ORAL | Status: DC

## 2011-11-04 MED ORDER — LACTATED RINGERS IV SOLN: INTRAVENOUS | Status: DC

## 2011-11-04 MED ORDER — PROMETHAZINE HCL 25 MG/ML IJ SOLN: 6.2500 mg | INTRAMUSCULAR | Status: DC | PRN

## 2011-11-04 MED ORDER — EPHEDRINE SULFATE 50 MG/ML IJ SOLN
INTRAMUSCULAR | Status: DC | PRN
  Administered 2011-11-04: 5 mg via INTRAVENOUS

## 2011-11-04 MED ORDER — CALCIUM CARBONATE 1250 (500 CA) MG PO TABS
2.0000 | ORAL_TABLET | Freq: Three times a day (TID) | ORAL | Status: DC
  Administered 2011-11-04 – 2011-11-05 (×3): 1000 mg via ORAL
  Filled 2011-11-04 (×5): qty 2

## 2011-11-04 MED ORDER — LIDOCAINE HCL (CARDIAC) 20 MG/ML IV SOLN
INTRAVENOUS | Status: DC | PRN
  Administered 2011-11-04: 50 mg via INTRAVENOUS

## 2011-11-04 MED ORDER — METFORMIN HCL 500 MG PO TABS
500.0000 mg | ORAL_TABLET | Freq: Two times a day (BID) | ORAL | Status: DC
  Filled 2011-11-04 (×2): qty 1

## 2011-11-04 MED ORDER — HYDROMORPHONE HCL PF 1 MG/ML IJ SOLN
INTRAMUSCULAR | Status: AC
  Filled 2011-11-04: qty 1

## 2011-11-04 MED ORDER — HYDROMORPHONE HCL PF 1 MG/ML IJ SOLN
0.2500 mg | INTRAMUSCULAR | Status: DC | PRN
  Administered 2011-11-04 (×2): 0.5 mg via INTRAVENOUS

## 2011-11-04 MED ORDER — MIDAZOLAM HCL 5 MG/5ML IJ SOLN
INTRAMUSCULAR | Status: DC | PRN
  Administered 2011-11-04: 2 mg via INTRAVENOUS

## 2011-11-04 MED ORDER — HYDROMORPHONE HCL PF 1 MG/ML IJ SOLN: 1.0000 mg | INTRAMUSCULAR | Status: DC | PRN

## 2011-11-04 MED ORDER — DEXAMETHASONE SODIUM PHOSPHATE 10 MG/ML IJ SOLN
INTRAMUSCULAR | Status: DC | PRN
  Administered 2011-11-04: 10 mg via INTRAVENOUS

## 2011-11-04 MED ORDER — CEFAZOLIN SODIUM-DEXTROSE 2-3 GM-% IV SOLR
2.0000 g | INTRAVENOUS | Status: AC
  Administered 2011-11-04: 2 g via INTRAVENOUS

## 2011-11-04 MED ORDER — LACTATED RINGERS IV SOLN
INTRAVENOUS | Status: DC | PRN
  Administered 2011-11-04 (×2): via INTRAVENOUS

## 2011-11-04 MED ORDER — HYDROCODONE-ACETAMINOPHEN 5-325 MG PO TABS
1.0000 | ORAL_TABLET | ORAL | Status: DC | PRN
  Administered 2011-11-04 – 2011-11-05 (×4): 2 via ORAL
  Filled 2011-11-04 (×4): qty 2

## 2011-11-04 MED ORDER — 0.9 % SODIUM CHLORIDE (POUR BTL) OPTIME
TOPICAL | Status: DC | PRN
  Administered 2011-11-04: 1000 mL

## 2011-11-04 MED ORDER — KCL IN DEXTROSE-NACL 20-5-0.45 MEQ/L-%-% IV SOLN
INTRAVENOUS | Status: DC
  Administered 2011-11-04: 14:00:00 via INTRAVENOUS
  Filled 2011-11-04 (×3): qty 1000

## 2011-11-04 MED ORDER — KCL IN DEXTROSE-NACL 20-5-0.45 MEQ/L-%-% IV SOLN
INTRAVENOUS | Status: AC
  Filled 2011-11-04: qty 1000

## 2011-11-04 MED ORDER — FENTANYL CITRATE 0.05 MG/ML IJ SOLN
25.0000 ug | INTRAMUSCULAR | Status: DC | PRN
  Administered 2011-11-04 (×3): 50 ug via INTRAVENOUS

## 2011-11-04 MED ORDER — ACETAMINOPHEN 325 MG PO TABS: 650.0000 mg | ORAL_TABLET | ORAL | Status: DC | PRN

## 2011-11-04 MED ORDER — CISATRACURIUM BESYLATE (PF) 10 MG/5ML IV SOLN
INTRAVENOUS | Status: DC | PRN
  Administered 2011-11-04: 10 mg via INTRAVENOUS
  Administered 2011-11-04: 3 mg via INTRAVENOUS

## 2011-11-04 SURGICAL SUPPLY — 39 items
ATTRACTOMAT 16X20 MAGNETIC DRP (DRAPES) ×2 IMPLANT
BENZOIN TINCTURE PRP APPL 2/3 (GAUZE/BANDAGES/DRESSINGS) ×2 IMPLANT
BLADE HEX COATED 2.75 (ELECTRODE) ×2 IMPLANT
BLADE SURG 15 STRL LF DISP TIS (BLADE) ×1 IMPLANT
BLADE SURG 15 STRL SS (BLADE) ×1
CANISTER SUCTION 2500CC (MISCELLANEOUS) ×2 IMPLANT
CHLORAPREP W/TINT 10.5 ML (MISCELLANEOUS) ×2 IMPLANT
CLIP TI MEDIUM 6 (CLIP) ×10 IMPLANT
CLIP TI WIDE RED SMALL 6 (CLIP) ×12 IMPLANT
CLOTH BEACON ORANGE TIMEOUT ST (SAFETY) ×2 IMPLANT
CLSR STERI-STRIP ANTIMIC 1/2X4 (GAUZE/BANDAGES/DRESSINGS) ×2 IMPLANT
DISSECTOR ROUND CHERRY 3/8 STR (MISCELLANEOUS) IMPLANT
DRAPE PED LAPAROTOMY (DRAPES) ×2 IMPLANT
DRESSING SURGICEL FIBRLLR 1X2 (HEMOSTASIS) ×1 IMPLANT
DRSG SURGICEL FIBRILLAR 1X2 (HEMOSTASIS) ×2
ELECT REM PT RETURN 9FT ADLT (ELECTROSURGICAL) ×2
ELECTRODE REM PT RTRN 9FT ADLT (ELECTROSURGICAL) ×1 IMPLANT
GAUZE SPONGE 4X4 16PLY XRAY LF (GAUZE/BANDAGES/DRESSINGS) ×4 IMPLANT
GLOVE SURG ORTHO 8.0 STRL STRW (GLOVE) ×2 IMPLANT
GOWN STRL NON-REIN LRG LVL3 (GOWN DISPOSABLE) ×2 IMPLANT
GOWN STRL REIN XL XLG (GOWN DISPOSABLE) ×4 IMPLANT
KIT BASIN OR (CUSTOM PROCEDURE TRAY) ×2 IMPLANT
NS IRRIG 1000ML POUR BTL (IV SOLUTION) ×2 IMPLANT
PACK BASIC VI WITH GOWN DISP (CUSTOM PROCEDURE TRAY) ×2 IMPLANT
PENCIL BUTTON HOLSTER BLD 10FT (ELECTRODE) ×2 IMPLANT
SHEARS HARMONIC 9CM CVD (BLADE) ×2 IMPLANT
SPONGE GAUZE 4X4 12PLY (GAUZE/BANDAGES/DRESSINGS) ×2 IMPLANT
STAPLER VISISTAT 35W (STAPLE) IMPLANT
STRIP CLOSURE SKIN 1/2X4 (GAUZE/BANDAGES/DRESSINGS) ×2 IMPLANT
SUT MNCRL AB 4-0 PS2 18 (SUTURE) ×2 IMPLANT
SUT SILK 2 0 (SUTURE) ×1
SUT SILK 2-0 18XBRD TIE 12 (SUTURE) ×1 IMPLANT
SUT SILK 3 0 (SUTURE)
SUT SILK 3-0 18XBRD TIE 12 (SUTURE) IMPLANT
SUT VIC AB 3-0 SH 18 (SUTURE) ×4 IMPLANT
SYR BULB IRRIGATION 50ML (SYRINGE) ×2 IMPLANT
TAPE CLOTH SURG 4X10 WHT LF (GAUZE/BANDAGES/DRESSINGS) ×2 IMPLANT
TOWEL OR 17X26 10 PK STRL BLUE (TOWEL DISPOSABLE) ×2 IMPLANT
YANKAUER SUCT BULB TIP 10FT TU (MISCELLANEOUS) ×2 IMPLANT

## 2011-11-04 NOTE — Transfer of Care (Signed)
Immediate Anesthesia Transfer of Care Note  Patient: Joan Vargas  Procedure(s) Performed: Procedure(s) (LRB): THYROIDECTOMY (N/A)  Patient Location: PACU  Anesthesia Type: General  Level of Consciousness: awake, sedated and patient cooperative  Airway & Oxygen Therapy: Patient Spontanous Breathing and Patient connected to face mask oxygen  Post-op Assessment: Report given to PACU RN and Post -op Vital signs reviewed and stable  Post vital signs: Reviewed and stable  Complications: No apparent anesthesia complications

## 2011-11-04 NOTE — Anesthesia Postprocedure Evaluation (Signed)
Anesthesia Post Note  Patient: Joan Vargas  Procedure(s) Performed: Procedure(s) (LRB): THYROIDECTOMY (N/A)  Anesthesia type: General  Patient location: PACU  Post pain: Pain level controlled  Post assessment: Post-op Vital signs reviewed  Last Vitals:  Filed Vitals:   11/04/11 1230  BP: 151/88  Pulse: 87  Temp:   Resp:     Post vital signs: Reviewed  Level of consciousness: sedated  Complications: No apparent anesthesia complications

## 2011-11-04 NOTE — Progress Notes (Signed)
Dr. Rica Mast made aware of patient's CBG RESULTS  In PACU- 230

## 2011-11-04 NOTE — Brief Op Note (Signed)
11/04/2011  12:32 PM  PATIENT:  Joan Vargas  56 y.o. female  PRE-OPERATIVE DIAGNOSIS:  thyroid goiter w/compressive symptoms, tracheal deviation  POST-OPERATIVE DIAGNOSIS:  Same  PROCEDURE:  Total Thyroidectomy  SURGEON:  Surgeon(s) and Role:    * Velora Heckler, MD - Primary    * Ardeth Sportsman, MD - Assisting  ANESTHESIA:   general  EBL:  Total I/O In: 1950 [I.V.:1950] Out: 100 [Blood:100]  BLOOD ADMINISTERED:none  DRAINS: none   LOCAL MEDICATIONS USED:  MARCAINE    and NONE  SPECIMEN:  Excision  DISPOSITION OF SPECIMEN:  PATHOLOGY  COUNTS:  YES  TOURNIQUET:  * No tourniquets in log *  DICTATION: .Other Dictation: Dictation Number [id number]  PLAN OF CARE: Admit for overnight observation  PATIENT DISPOSITION:  PACU - hemodynamically stable.   Delay start of Pharmacological VTE agent (>24hrs) due to surgical blood loss or risk of bleeding: yes  Velora Heckler, MD, FACS General & Endocrine Surgery [facility] Office: [phone]

## 2011-11-04 NOTE — Interval H&P Note (Signed)
History and Physical Interval Note:  11/04/2011 10:07 AM  Joan Vargas  has presented today for surgery, with the diagnosis of thyroid goiter w/compressive symptoms.  The various methods of treatment have been discussed with the patient and family. After consideration of risks, benefits and other options for treatment, the patient has consented to    Procedure(s) (LRB): THYROIDECTOMY (N/A) as a surgical intervention .    The patients' history has been reviewed, patient examined, no change in status, stable for surgery.  I have reviewed the patients' chart and labs.  Questions were answered to the patient's satisfaction.    Velora Heckler, MD, FACS General & Endocrine Surgery [facility] Office: [phone]  Joan Vargas

## 2011-11-04 NOTE — Anesthesia Preprocedure Evaluation (Addendum)
Anesthesia Evaluation  Patient identified by MRN, date of birth, ID band Patient awake    Reviewed: Allergy & Precautions, H&P , NPO status , Patient's Chart, lab work & pertinent test results  Airway Mallampati: II TM Distance: >3 FB Neck ROM: Full    Dental  (+) Edentulous Upper and Edentulous Lower   Pulmonary neg pulmonary ROS,  breath sounds clear to auscultation  Pulmonary exam normal       Cardiovascular hypertension, negative cardio ROS  Rhythm:Regular Rate:Normal     Neuro/Psych negative neurological ROS  negative psych ROS   GI/Hepatic negative GI ROS, Neg liver ROS, GERD-  Medicated,  Endo/Other  negative endocrine ROSDiabetes mellitus- (Diet control), Type 2, Oral Hypoglycemic Agents  Renal/GU negative Renal ROS  negative genitourinary   Musculoskeletal negative musculoskeletal ROS (+) Fibromyalgia -  Abdominal   Peds  Hematology negative hematology ROS (+)   Anesthesia Other Findings   Reproductive/Obstetrics negative OB ROS                          Anesthesia Physical Anesthesia Plan  ASA: II  Anesthesia Plan: General   Post-op Pain Management:    Induction: Intravenous  Airway Management Planned: Oral ETT  Additional Equipment:   Intra-op Plan:   Post-operative Plan: Extubation in OR  Informed Consent: I have reviewed the patients History and Physical, chart, labs and discussed the procedure including the risks, benefits and alternatives for the proposed anesthesia with the patient or authorized representative who has indicated his/her understanding and acceptance.   Dental advisory given  Plan Discussed with: CRNA  Anesthesia Plan Comments:         Anesthesia Quick Evaluation

## 2011-11-04 NOTE — Progress Notes (Signed)
11/04/11 2300  Nursing Dr Ezzard Standing called re cbg 260 at bedtime. Order received to cover with appropriate hs sliding scale coverage. Pt given 3 u novolog insulin at this time

## 2011-11-04 NOTE — Progress Notes (Signed)
Quick Note:  These results are acceptable for scheduled surgery.  Macai Sisneros M. Rosamund Nyland, MD, FACS [facility] Office: [phone]   ______ 

## 2011-11-04 NOTE — H&P (View-Only) (Signed)
Chief Complaint  Patient presents with  . New Evaluation    thyroid nodule - referral from Dr. Elizabeth Dewey    HISTORY: The patient is a 56-year-old white female referred by her primary care physician for her newly diagnosed thyroid goiter. The patient was found on physical examination by her primary care physician to have an asymmetrically enlarged thyroid gland. Right lobe was noted to be markedly larger than the left. Patient complained of some minor pressure sensation. She has no significant dysphagia. Patient has never been on thyroid medication. She has had no prior biopsies.  There is no significant family history for endocrine disease. Patient has never had surgery on the head or neck. She has never been on thyroid medication.  Thyroid ultrasound was obtained at [facility] on September 21, 2011.  The right lobe is markedly enlarged at 3.8 x 5.3 x 6.4 cm. Left lobe was also enlarged at 2.2 x 2.4 x 5.6 cm. There are complex cyst and nodules throughout the gland which is extremely heterogeneous.  Past Medical History  Diagnosis Date  . Diabetes mellitus   . Hypertension   . Depression   . Fibromyalgia   . Restless leg      Current Outpatient Prescriptions  Medication Sig Dispense Refill  . aspirin 81 MG tablet Take 81 mg by mouth at bedtime.        . BAYER BREEZE 2 TEST DISK       . cetirizine (ZYRTEC) 10 MG tablet Take 10 mg by mouth at bedtime as needed. Allergies.       . cholecalciferol (VITAMIN D) 1000 UNITS tablet Take 1,000 Units by mouth daily.        . CINNAMON PO Take by mouth daily.      . cyclobenzaprine (FLEXERIL) 10 MG tablet Take 10 mg by mouth as needed.      . diclofenac sodium (VOLTAREN) 1 % GEL Apply 1 application topically as needed. Pain for thumb and knees       . estradiol (ESTRACE) 0.1 MG/GM vaginal cream Place 2 g vaginally daily.      . fluticasone (FLOVENT DISKUS) 50 MCG/BLIST diskus inhaler Inhale 1 puff into the lungs 2 (two) times daily.      .  Ginger, Zingiber officinalis, (GINGER PO) Take by mouth daily.      . Glucosamine-Chondroitin (MOVE FREE PO) Take by mouth.      . halobetasol (ULTRAVATE) 0.05 % cream Apply 1 application topically 2 (two) times daily.        . HYDROcodone-acetaminophen (NORCO) 5-325 MG per tablet Take 1 tablet by mouth every 6 (six) hours as needed.      . losartan (COZAAR) 100 MG tablet Take 100 mg by mouth daily.       . metFORMIN (GLUCOPHAGE) 500 MG tablet Take 500 mg by mouth 2 (two) times daily with a meal.        . omega-3 acid ethyl esters (LOVAZA) 1 G capsule Take 2 g by mouth 2 (two) times daily.        . acidophilus (RISAQUAD) CAPS Take 1 capsule by mouth daily.        . desvenlafaxine (PRISTIQ) 100 MG 24 hr tablet Take 100 mg by mouth at bedtime.        . ferrous gluconate (FERGON) 246 (28 FE) MG tablet Take 240 mg by mouth daily.        . fluticasone (FLONASE) 50 MCG/ACT nasal spray Place 2 sprays into the   nose daily as needed.        . metoprolol (LOPRESSOR) 50 MG tablet Take 50 mg by mouth daily.        . Multiple Vitamin (MULTIVITAMIN) tablet Take 1 tablet by mouth daily.      . pramipexole (MIRAPEX) 0.25 MG tablet Take 1 tablet (0.25 mg total) by mouth at bedtime.  60 tablet  0  . pregabalin (LYRICA) 150 MG capsule Take 150 mg by mouth 2 (two) times daily.        . triamcinolone (KENALOG) 0.1 % cream Apply 1 application topically 2 (two) times daily.        . triamterene-hydrochlorothiazide (DYAZIDE) 37.5-25 MG per capsule Take 1 capsule by mouth every morning.        . vitamin C (ASCORBIC ACID) 500 MG tablet Take 500 mg by mouth daily.         Current Facility-Administered Medications  Medication Dose Route Frequency Provider Last Rate Last Dose  . 0.9 %  sodium chloride infusion  500 mL Intravenous Continuous Malcolm T Stark, MD,FACG         Allergies  Allergen Reactions  . Aspirin Nausea And Vomiting    REACTION: stomach upset     Family History  Problem Relation Age of Onset  .  Colon cancer Neg Hx   . Stomach cancer Neg Hx   . Diabetes Mother      History   Social History  . Marital Status: Married    Spouse Name: N/A    Number of Children: N/A  . Years of Education: N/A   Social History Main Topics  . Smoking status: Never Smoker   . Smokeless tobacco: Never Used  . Alcohol Use: No  . Drug Use: No  . Sexually Active: None   Other Topics Concern  . None   Social History Narrative  . None     REVIEW OF SYSTEMS - PERTINENT POSITIVES ONLY: Denies tremors. He denies palpitations. Mild dysphagia. Moderate pressure sensation right lobe.  EXAM: Filed Vitals:   11/02/11 0942  BP: 144/78  Pulse: 104  Temp: 96.8 F (36 C)  Resp: 20    HEENT: normocephalic; pupils equal and reactive; sclerae clear; dentition good; mucous membranes moist NECK:  Palpation reveals a markedly enlarged thyroid gland which is quite firm. Right lobe is markedly enlarged and actually causes tracheal deviation to the left. There is moderate tenderness over the right lobe. Left lobe is firm but not tender. No palpable lymphadenopathy; asymmetric on extension; no palpable anterior or posterior cervical lymphadenopathy; no supraclavicular masses; no tenderness CHEST: clear to auscultation bilaterally without rales, rhonchi, or wheezes CARDIAC: regular rate and rhythm without significant murmur; peripheral pulses are full EXT:  non-tender without edema; no deformity NEURO: no gross focal deficits; no sign of tremor   LABORATORY RESULTS: See [facility] (CHL-Epic) for most recent results   RADIOLOGY RESULTS: See [facility] (CHL-Epic) for most recent results   IMPRESSION: Asymmetric thyroid goiter with tracheal deviation, mild compressive symptoms  PLAN: The patient and I discussed the options for management. Given her mild compressive symptoms and her significant tracheal deviation, I believe she is a good candidate for total thyroidectomy. We discussed the  procedure at length. We discussed the risk and benefits including the possibility of recurrent laryngeal nerve injury and injury to parathyroid glands. We discussed the hospital stay. We discussed the location of the surgical incision. We discussed her postoperative recovery. She understands and wishes to proceed in   the near future. We will make these arrangements.  The risks and benefits of the procedure have been discussed at length with the patient.  The patient understands the proposed procedure, potential alternative treatments, and the course of recovery to be expected.  All of the patient's questions have been answered at this time.  The patient wishes to proceed with surgery.  Lanell Dubie M. Angelyne Terwilliger, MD, FACS General & Endocrine Surgery [facility]   Visit Diagnoses: 1. Goiter diffuse, right thyroid lobe     Primary Care Physician: DEWEY,ELIZABETH, MD, MD   

## 2011-11-05 ENCOUNTER — Encounter (HOSPITAL_COMMUNITY): Payer: Self-pay | Admitting: Surgery

## 2011-11-05 LAB — BASIC METABOLIC PANEL
GFR calc Af Amer: >90 mL/min (ref >90)
GFR calc non Af Amer: >90 mL/min (ref >90)
Glucose, Bld: 153 mg/dL — ABNORMAL HIGH (ref 70–99)
Potassium: 4 mEq/L (ref 3.5–5.1)
Sodium: 138 mEq/L (ref 135–145)

## 2011-11-05 LAB — GLUCOSE, CAPILLARY
Glucose-Capillary: 176 mg/dL — ABNORMAL HIGH (ref 70–99)
Glucose-Capillary: 214 mg/dL — ABNORMAL HIGH (ref 70–99)

## 2011-11-05 MED ORDER — HYDROCODONE-ACETAMINOPHEN 5-325 MG PO TABS: 1.0000 | ORAL_TABLET | ORAL | Status: AC | PRN

## 2011-11-05 MED ORDER — SYNTHROID 88 MCG PO TABS: 88.0000 ug | ORAL_TABLET | Freq: Every day | ORAL | Status: DC

## 2011-11-05 MED ORDER — CALCIUM CARBONATE 1250 (500 CA) MG PO TABS: 2.0000 | ORAL_TABLET | Freq: Three times a day (TID) | ORAL | Status: DC

## 2011-11-05 NOTE — Care Management Note (Signed)
    Page 1 of 2   11/05/2011     12:45:17 PM   CARE MANAGEMENT NOTE 11/05/2011  Patient:  Joan Vargas, Joan Vargas   Account Number:  [id number]  Date Initiated:  11/05/2011  Documentation initiated by:  Colleen Can  Subjective/Objective Assessment:   dx thyroid goiter with compressive symptoms; total thyroidectomy     Action/Plan:   CM spoke with patient' Plansare for discharge to home in [place]. Will have caregiver. Pt is ambulatory. No DME of HH needs assessed   Anticipated DC Date:  11/05/2011   Anticipated DC Plan:  HOME/SELF CARE  In-house referral  NA      DC Planning Services  CM consult      PAC Choice  NA   Choice offered to / List presented to:  NA   DME arranged  NA      DME agency  NA     HH arranged  NA      HH agency  NA   Status of service:  Completed, signed off Medicare Important Message given?  NO (If response is "NO", the following Medicare IM given date fields will be blank) Date Medicare IM given:   Date Additional Medicare IM given:    Discharge Disposition:  HOME/SELF CARE  Per UR Regulation:    If discussed at Long Length of Stay Meetings, dates discussed:    Comments:

## 2011-11-05 NOTE — Op Note (Signed)
NAMEMarland Kitchen  Joan Vargas, Joan Vargas                  ACCOUNT NO.:  [id number]  MEDICAL RECORD NO.:  [id number]  LOCATION:  1605                         FACILITY[facility]  [facility]  PHYSICIAN:  Velora Heckler, MD      DATE OF BIRTH:  04/14/1956  DATE OF PROCEDURE:  11/04/2011 DATE OF DISCHARGE:  11/05/2011                              OPERATIVE REPORT   PREOPERATIVE DIAGNOSIS:  Thyroid goiter with compressive symptoms, tracheal deviation.  POSTOPERATIVE DIAGNOSIS:  Thyroid goiter with compressive symptoms, tracheal deviation.  PROCEDURE:  Total thyroidectomy.  SURGEON:  Velora Heckler, MD, FACS  ASSISTANT:  Ardeth Sportsman, MD.  ANESTHESIA:  General.  ESTIMATED BLOOD LOSS:  100 cc.  PREPARATION:  ChloraPrep.  COMPLICATIONS:  None.  INDICATIONS:  The patient is a 56 year old, white female, referred by her primary care physician for newly diagnosed thyroid goiter.  The patient was noted on physical exam to have an asymmetrically enlarged thyroid gland with tracheal deviation.  The patient complained of minor compressive symptoms.  She denied dysphagia.  She has never been on thyroid medication.  Thyroid ultrasound showed a markedly enlarged right thyroid lobe measuring 3.8 x 5.3 x 6.4 cm.  Left lobe is also enlarged at 5.6 cm.  The patient now comes to Surgery for total thyroidectomy.  BODY OF REPORT:  Procedure was done in OR #10 at the [facility].  The patient was brought to the operating room, placed in supine position on the operating room table.  Following administration of general anesthesia, the patient was positioned and then prepped and draped in the usual strict aseptic fashion.  After ascertaining that an adequate level of anesthesia had been achieved, a Kocher incision was made with a #15 blade.  Dissection was carried through subcutaneous tissues and platysma.  Hemostasis was obtained with electrocautery.  Skin flaps were elevated cephalad and caudad from the thyroid  notch to the sternal notch.  A Mahorner self-retaining retractor was placed for exposure.  Strap muscles were incised in the midline and reflected to the left.  Left lobe is soft without dominant or discrete mass.  Dissection was initially begun on the right side.  Strap muscles were reflected laterally exposing a markedly enlarged right thyroid lobe. There was a dominant nodule which occupies the anterior and central portion of the lobe.  It was gently mobilized with blunt dissection. Venous tributaries were divided between Ligaclips with the Harmonic Scalpel.  Gland was gently mobilized.  Superior pole vessels were dissected out and divided between medium Ligaclips with the Harmonic Scalpel.  Gland was rotated anteriorly.  With some difficulty, the gland was freed from the surrounding structures.  It appears somewhat inflammatory.  Inferior venous tributaries are divided between Ligaclips with the Harmonic Scalpel.  Gland was rolled anteriorly.  Branches of the inferior thyroid artery were divided between Ligaclips with the Harmonic Scalpel.  Care was taken to preserve the recurrent nerve and the parathyroid tissue which was identified on the right side.  Ligament of Allyson Sabal was released with the electrocautery and the gland was mobilized up and onto the anterior trachea.  There was a small pyramidal lobe, which was resected with  the isthmus.  Isthmus was mobilized across the midline.  Dry pack was placed in the right neck.  Strap muscles were reflected laterally on the left side.  Left thyroid lobe was mildly enlarged, but softer in character without dominant or discrete mass.  Venous tributaries were divided between Ligaclips with the Harmonic Scalpel.  Superior pole vessels are dissected out and divided between Ligaclips with the Harmonic Scalpel.  Gland was rolled anteriorly.  Branches of the inferior thyroid artery were divided between Ligaclips with the Harmonic Scalpel.  Inferior  venous tributaries are divided between Ligaclips with the Harmonic Scalpel. Recurrent nerve was identified and preserved.  Ligament of Allyson Sabal was released with the electrocautery and the gland was mobilized up and onto the anterior trachea.  It was excised off the trachea with the electrocautery used for hemostasis.  Sutures were used to mark the left superior pole of the thyroid gland.  The entire gland was submitted to Pathology for review.  Neck was irrigated with warm saline.  Good hemostasis was achieved bilaterally.  Surgicel was placed in the operative field bilaterally. Strap muscles were reapproximated in the midline with interrupted 3-0 Vicryl sutures.  Platysma was closed with interrupted 3-0 Vicryl sutures.  Skin was closed with a running 4-0 Monocryl subcuticular suture.  Wound was washed and dried, and benzoin and Steri-Strips were applied.  Sterile dressings were applied.  The patient was awakened from anesthesia and brought to the recovery room.  The patient tolerated the procedure well.   Velora Heckler, MD, FACS   TMG/MEDQ  D:  11/04/2011  T:  11/05/2011  Job:  [id number]  cc:   Maryelizabeth Rowan, M.D. Fax: [phone]

## 2011-11-05 NOTE — Discharge Summary (Signed)
Physician Discharge Summary [facility]  Patient ID: Joan Vargas MRN: [id number] DOB/AGE: March 31, 1956 56 y.o.  Admit date: 11/04/2011 Discharge date: 11/05/2011  Admission Diagnoses:  Thyroid goiter with compressive symptoms  Discharge Diagnoses:  Active Problems:  * No active hospital problems. *    Discharged Condition: good  Hospital Course: patient admitted after total thyroidectomy.  Stable post op with calcium level 9.7 on morning following surgery.  Pain and nausea controlled.  Tolerated diet.  Prepared for discharge AM POD#1.  Consults: None  Significant Diagnostic Studies: none  Treatments: surgery: total thyroidectomy  Discharge Exam: Blood pressure 149/83, pulse 76, temperature 98.4 F (36.9 C), temperature source Oral, resp. rate 18, height 5\' 1"  (1.549 m), weight 226 lb (102.513 kg), SpO2 95.00%. HEENT - clear Neck - wound with minimal STS, some ecchymosis, normal voice Chest - clear Cor - RRR Ext - no edema  Disposition: Home with family  Discharge Orders    Future Appointments: Provider: Department: Dept Phone: Center:   11/17/2011 12:00 PM Velora Heckler, MD [facility] [phone] None     Future Orders Please Complete By Expires   Diet - low sodium heart healthy      Increase activity slowly      Discharge instructions      Comments:   [facility], [place] [phone]  THYROID & PARATHYROID SURGERY -- POST OP INSTRUCTIONS  Always review your discharge instruction sheet from the facility where your surgery was performed.  A prescription for pain medication may be given to you upon discharge.  Take your pain medication as prescribed, if needed.  If narcotic pain medicine is not needed, then you may take acetaminophen (Tylenol) or ibuprofen (Advil) as needed. Take your usually prescribed medications unless otherwise directed. If you need a refill on your pain medication, please contact your pharmacy. They will contact  our office to request authorization.  Prescriptions will not be processed after 5 pm or on weekends. Start with a light diet upon arrival home, such as soup and crackers, etc.  Be sure to drink pleny of fluids daily.  Resume your normal diet the day after surgery. Most patients will experience some swelling and bruising on the chest and neck area.  Ice packs will help.  Swelling and bruising can take several days to resolve.  It is common to experience some constipation if taking pain medication after surgery.  Increasing fluid intake and taking a stool softener will usually help or prevent this problem.  A mild laxative (Milk of Magnesia or Miralax) should be taken according to package directions if there are no bowel movements after 48 hours. You may remove your bandages 24-48 hours after surgery, and you may shower at that time.  You have steri-strips (small skin tapes) in place directly over the incision.  These strips should be left on the skin for 7-10 days and then removed. You may resume regular (light) daily activities beginning the next day--such as daily self-care, walking, climbing stairs--gradually increasing activities as tolerated.  You may have sexual intercourse when it is comfortable.  Refrain from any heavy lifting or straining until approved by your doctor.  You may drive when you no longer are taking prescription pain medication, you can comfortably wear a seatbelt, and you can safely maneuver your car and apply brakes. You should see your doctor in the office for a follow-up appointment approximately two weeks after your surgery.  Make sure that you call for this appointment  within a day or two after you arrive home to insure a convenient appointment time.  WHEN TO CALL YOUR DOCTOR: Fever over 101.5 Inability to urinate Nausea and/or vomiting - persistent Extreme swelling or bruising Continued bleeding from incision Increased pain, redness, or drainage from the  incision Difficulty swallowing or breathing Muscle cramping or spasms Numbness or tingling in hands or feet or around lips  The clinic staff is available to answer your questions during regular business hours.  Please don't hesitate to call and ask to speak to one of the nurses if you have concerns.  [email or web address]    Remove dressing in 24 hours      Apply dressing        Medication List  As of 11/05/2011 11:31 AM   TAKE these medications         acidophilus Caps   Take 1 capsule by mouth daily.      TRUBIOTICS PO   Take 1 tablet by mouth daily.      aspirin 81 MG tablet   Take 81 mg by mouth at bedtime.      BAYER BREEZE 2 TEST Disk   Generic drug: Glucose Blood      calcium carbonate 1250 MG tablet   Commonly known as: OS-CAL - dosed in mg of elemental calcium   Take 2 tablets (1,000 mg of elemental calcium total) by mouth 3 (three) times daily.      cetirizine 10 MG tablet   Commonly known as: ZYRTEC   Take 10 mg by mouth at bedtime as needed. Allergies.      cholecalciferol 1000 UNITS tablet   Commonly known as: VITAMIN D   Take 2,000 Units by mouth daily.      CINNAMON PO   Take 1,000 mg by mouth 2 (two) times daily.      cyclobenzaprine 10 MG tablet   Commonly known as: FLEXERIL   Take 10 mg by mouth as needed.      desvenlafaxine 100 MG 24 hr tablet   Commonly known as: PRISTIQ   Take 100 mg by mouth at bedtime.      diclofenac sodium 1 % Gel   Commonly known as: VOLTAREN   Apply 1 application topically as needed. Pain for thumb and knees      estradiol 0.1 MG/GM vaginal cream   Commonly known as: ESTRACE   Place 2 g vaginally once a week.      ferrous gluconate 246 (28 FE) MG tablet   Commonly known as: FERGON   Take 240 mg by mouth daily.      ferrous sulfate 325 (65 FE) MG EC tablet   Take 325 mg by mouth daily.      fluticasone 50 MCG/ACT nasal spray   Commonly known as: FLONASE   Place 2 sprays into the nose daily as  needed.      fluticasone 50 MCG/BLIST diskus inhaler   Commonly known as: FLOVENT DISKUS   Inhale 1 puff into the lungs 2 (two) times daily.      GINGER PO   Take by mouth 2 (two) times daily.      halobetasol 0.05 % cream   Commonly known as: ULTRAVATE   Apply 1 application topically 2 (two) times daily.      HYDROcodone-acetaminophen 5-325 MG per tablet   Commonly known as: NORCO   Take 1-2 tablets by mouth every 4 (four) hours as needed.      HYDROcodone-acetaminophen 5-325 MG per  tablet   Commonly known as: NORCO   Take 1 tablet by mouth every 6 (six) hours as needed.      losartan 100 MG tablet   Commonly known as: COZAAR   Take 100 mg by mouth daily.      losartan-hydrochlorothiazide 100-25 MG per tablet   Commonly known as: HYZAAR   Take 1 tablet by mouth daily.      metFORMIN 500 MG tablet   Commonly known as: GLUCOPHAGE   Take 500 mg by mouth 2 (two) times daily with a meal.      metoprolol 50 MG tablet   Commonly known as: LOPRESSOR   Take by mouth daily.      metroNIDAZOLE 0.75 % gel   Commonly known as: METROGEL   Apply topically 2 (two) times daily. prn      MOVE FREE PO   Take 1 tablet by mouth daily.      multivitamin tablet   Take 1 tablet by mouth daily.      omega-3 acid ethyl esters 1 G capsule   Commonly known as: LOVAZA   Take 2 g by mouth 2 (two) times daily.      pramipexole 0.25 MG tablet   Commonly known as: MIRAPEX   Take 1 tablet (0.25 mg total) by mouth at bedtime.      pregabalin 150 MG capsule   Commonly known as: LYRICA   Take 150 mg by mouth 2 (two) times daily.      SYNTHROID 88 MCG tablet   Generic drug: levothyroxine   Take 1 tablet (88 mcg total) by mouth daily.      triamcinolone cream 0.1 %   Commonly known as: KENALOG   Apply 1 application topically 2 (two) times daily. Prn for rash      triamterene-hydrochlorothiazide 37.5-25 MG per capsule   Commonly known as: DYAZIDE   Take 1 capsule by mouth every morning.       vitamin C 500 MG tablet   Commonly known as: ASCORBIC ACID   Take 500 mg by mouth daily.            Velora Heckler, MD, FACS General & Endocrine Surgery [facility] Office: [phone]  Signed: Velora Heckler 11/05/2011, 11:31 AM

## 2011-11-08 NOTE — Progress Notes (Signed)
Quick Note:  Please contact patient and notify of benign pathology results.  Laurell Coalson M. Criss Pallone, MD, FACS [facility] Office: [phone]   ______ 

## 2011-11-15 ENCOUNTER — Other Ambulatory Visit (INDEPENDENT_AMBULATORY_CARE_PROVIDER_SITE_OTHER): Payer: Self-pay | Admitting: Surgery

## 2011-11-16 LAB — CALCIUM: Calcium: 10 mg/dL (ref 8.4–10.5)

## 2011-11-17 ENCOUNTER — Encounter (INDEPENDENT_AMBULATORY_CARE_PROVIDER_SITE_OTHER): Payer: Self-pay | Admitting: Surgery

## 2011-11-17 ENCOUNTER — Ambulatory Visit (INDEPENDENT_AMBULATORY_CARE_PROVIDER_SITE_OTHER): Payer: BC Managed Care – PPO | Admitting: Surgery

## 2011-11-17 VITALS — BP 140/86 | HR 86 | Temp 97.4°F | Resp 14 | Ht 61.0 in | Wt 226.2 lb

## 2011-11-17 DIAGNOSIS — E049 Nontoxic goiter, unspecified: Secondary | ICD-10-CM

## 2011-11-17 DIAGNOSIS — E040 Nontoxic diffuse goiter: Secondary | ICD-10-CM

## 2011-11-17 DIAGNOSIS — E890 Postprocedural hypothyroidism: Secondary | ICD-10-CM

## 2011-11-17 NOTE — Patient Instructions (Signed)
  COCOA BUTTER & VITAMIN E CREAM  ([employer] or other brand)  Apply cocoa butter/vitamin E cream to your incision 2 - 3 times daily.  Massage cream into incision for one minute with each application.  Use sunscreen (50 SPF or higher) for first 6 months after surgery if area is exposed to sun.  You may substitute Mederma or other scar reducing creams as desired.   

## 2011-11-17 NOTE — Progress Notes (Signed)
Visit Diagnoses: 1. Goiter diffuse, right thyroid lobe   2. Hypothyroidism, postsurgical     HISTORY: Patient returns for first postoperative visit having undergone total thyroidectomy for multinodular thyroid goiter. No malignancy was identified. Postoperative serum calcium level remained stable at 10.0.  EXAM: Surgical wound is healing nicely. Mild erythema along the suture line. Mild soft tissue swelling. Voice quality is normal.  IMPRESSION: Status post total thyroidectomy with benign final pathology  PLAN: Patient will remain on Synthroid 88 mcg daily. We will check a TSH level in 4 weeks. Patient will begin applying topical creams or her incision. She will return for final wound check in 6 weeks.  Velora Heckler, MD, FACS General & Endocrine Surgery [facility]

## 2011-11-23 ENCOUNTER — Encounter (INDEPENDENT_AMBULATORY_CARE_PROVIDER_SITE_OTHER): Payer: Self-pay

## 2011-12-20 ENCOUNTER — Telehealth (INDEPENDENT_AMBULATORY_CARE_PROVIDER_SITE_OTHER): Payer: Self-pay

## 2011-12-20 NOTE — Telephone Encounter (Signed)
TSH abn to Dr Gerrit Friends to review and advise.

## 2011-12-29 ENCOUNTER — Encounter (INDEPENDENT_AMBULATORY_CARE_PROVIDER_SITE_OTHER): Payer: Self-pay | Admitting: Surgery

## 2011-12-29 ENCOUNTER — Ambulatory Visit (INDEPENDENT_AMBULATORY_CARE_PROVIDER_SITE_OTHER): Payer: BC Managed Care – PPO | Admitting: Surgery

## 2011-12-29 VITALS — BP 142/90 | HR 84 | Temp 97.8°F | Resp 18 | Ht 61.5 in | Wt 231.4 lb

## 2011-12-29 DIAGNOSIS — E890 Postprocedural hypothyroidism: Secondary | ICD-10-CM

## 2011-12-29 DIAGNOSIS — E040 Nontoxic diffuse goiter: Secondary | ICD-10-CM

## 2011-12-29 DIAGNOSIS — E049 Nontoxic goiter, unspecified: Secondary | ICD-10-CM

## 2011-12-29 MED ORDER — LEVOTHYROXINE SODIUM 125 MCG PO TABS: 125.0000 ug | ORAL_TABLET | Freq: Every day | ORAL | Status: DC

## 2011-12-29 MED ORDER — SYNTHROID 88 MCG PO TABS: 112.0000 ug | ORAL_TABLET | Freq: Every day | ORAL | Status: DC

## 2011-12-29 NOTE — Patient Instructions (Signed)
  COCOA BUTTER & VITAMIN E CREAM  ([employer] or other brand)  Apply cocoa butter/vitamin E cream to your incision 2 - 3 times daily.  Massage cream into incision for one minute with each application.  Use sunscreen (50 SPF or higher) for first 6 months after surgery if area is exposed to sun.  You may substitute Mederma or other scar reducing creams as desired.   

## 2011-12-29 NOTE — Progress Notes (Signed)
General Surgery [facility]  Visit Diagnoses: 1. Hypothyroidism, postsurgical   2. Goiter diffuse, right thyroid lobe     HISTORY: Patient returns for final wound check having undergone total thyroidectomy. She is taking Synthroid 88 mcg daily. TSH level is markedly elevated at 30.610 on that dosage. Patient and I have discussed this lab results. I have asked her to make sure that she is taking it on an empty stomach with no other medications or supplements. I am going to increase her dosage to 125 mcg daily.  EXAM: Surgical wound is well-healed with an acceptable cosmetic result. No sign of seroma. No sign of infection. Voice quality is normal.  IMPRESSION: Status post total thyroidectomy for benign disease, post surgical hypothyroidism  PLAN: Patient will begin taking Synthroid 125 mcg daily. I will ask her primary physician to to repeat a TSH level in 6-8 weeks. Patient may need further dosage adjustment at that time.  Patient will return to see me as needed.  Velora Heckler, MD, FACS General & Endocrine Surgery [facility]

## 2012-01-06 ENCOUNTER — Encounter (INDEPENDENT_AMBULATORY_CARE_PROVIDER_SITE_OTHER): Payer: Self-pay

## 2012-08-01 ENCOUNTER — Other Ambulatory Visit: Payer: Self-pay | Admitting: Family Medicine

## 2012-08-01 DIAGNOSIS — Z1231 Encounter for screening mammogram for malignant neoplasm of breast: Secondary | ICD-10-CM

## 2012-09-01 ENCOUNTER — Ambulatory Visit
Admission: RE | Admit: 2012-09-01 | Discharge: 2012-09-01 | Disposition: A | Discharge status: Home / Self Care | Payer: BC Managed Care – PPO | Source: Ambulatory Visit | Attending: Family Medicine | Admitting: Family Medicine

## 2012-09-01 DIAGNOSIS — Z1231 Encounter for screening mammogram for malignant neoplasm of breast: Secondary | ICD-10-CM

## 2013-08-02 ENCOUNTER — Other Ambulatory Visit: Payer: Self-pay

## 2013-08-02 DIAGNOSIS — Z1231 Encounter for screening mammogram for malignant neoplasm of breast: Secondary | ICD-10-CM

## 2013-09-03 ENCOUNTER — Ambulatory Visit
Admission: RE | Admit: 2013-09-03 | Discharge: 2013-09-03 | Disposition: A | Discharge status: Home / Self Care | Payer: BC Managed Care – PPO | Source: Ambulatory Visit

## 2013-09-03 DIAGNOSIS — Z1231 Encounter for screening mammogram for malignant neoplasm of breast: Secondary | ICD-10-CM

## 2013-09-05 ENCOUNTER — Ambulatory Visit
Admission: RE | Admit: 2013-09-05 | Discharge: 2013-09-05 | Disposition: A | Discharge status: Home / Self Care | Payer: BC Managed Care – PPO | Source: Ambulatory Visit | Attending: Family Medicine | Admitting: Family Medicine

## 2013-09-05 ENCOUNTER — Other Ambulatory Visit: Payer: Self-pay | Admitting: Family Medicine

## 2013-09-05 DIAGNOSIS — W19XXXA Unspecified fall, initial encounter: Secondary | ICD-10-CM

## 2013-09-05 DIAGNOSIS — R52 Pain, unspecified: Secondary | ICD-10-CM

## 2013-09-05 DIAGNOSIS — M1990 Unspecified osteoarthritis, unspecified site: Secondary | ICD-10-CM

## 2013-09-10 ENCOUNTER — Other Ambulatory Visit: Payer: Self-pay | Admitting: Family Medicine

## 2013-09-10 DIAGNOSIS — M25561 Pain in right knee: Secondary | ICD-10-CM

## 2013-09-14 ENCOUNTER — Ambulatory Visit
Admission: RE | Admit: 2013-09-14 | Discharge: 2013-09-14 | Disposition: A | Discharge status: Home / Self Care | Payer: BC Managed Care – PPO | Source: Ambulatory Visit | Attending: Family Medicine | Admitting: Family Medicine

## 2013-09-14 DIAGNOSIS — M25561 Pain in right knee: Secondary | ICD-10-CM

## 2013-10-29 ENCOUNTER — Encounter (HOSPITAL_COMMUNITY): Payer: Self-pay | Admitting: Pharmacist

## 2013-10-30 ENCOUNTER — Encounter (HOSPITAL_COMMUNITY): Payer: Self-pay

## 2013-10-30 ENCOUNTER — Ambulatory Visit (HOSPITAL_COMMUNITY)
Admission: RE | Admit: 2013-10-30 | Discharge: 2013-10-30 | Disposition: A | Discharge status: Home / Self Care | Payer: BC Managed Care – PPO | Source: Ambulatory Visit | Attending: Anesthesiology | Admitting: Anesthesiology

## 2013-10-30 ENCOUNTER — Encounter (HOSPITAL_COMMUNITY)
Admission: RE | Admit: 2013-10-30 | Discharge: 2013-10-30 | Disposition: A | Discharge status: Home / Self Care | Payer: BC Managed Care – PPO | Source: Ambulatory Visit | Attending: Orthopedic Surgery | Admitting: Orthopedic Surgery

## 2013-10-30 DIAGNOSIS — Z01812 Encounter for preprocedural laboratory examination: Secondary | ICD-10-CM | POA: Insufficient documentation

## 2013-10-30 DIAGNOSIS — Z01818 Encounter for other preprocedural examination: Secondary | ICD-10-CM | POA: Insufficient documentation

## 2013-10-30 HISTORY — DX: Nausea with vomiting, unspecified: R11.2

## 2013-10-30 HISTORY — DX: Other specified postprocedural states: Z98.890

## 2013-10-30 LAB — BASIC METABOLIC PANEL
BUN: 14 mg/dL (ref 6–23)
CHLORIDE: 102 meq/L (ref 96–112)
CO2: 27 mEq/L (ref 19–32)
Calcium: 10.4 mg/dL (ref 8.4–10.5)
Creatinine, Ser: 0.83 mg/dL (ref 0.50–1.10)
GFR calc Af Amer: 87 mL/min — ABNORMAL LOW (ref >90)
GFR, EST NON AFRICAN AMERICAN: 75 mL/min — AB (ref >90)
GLUCOSE: 182 mg/dL — AB (ref 70–99)
POTASSIUM: 4.2 meq/L (ref 3.7–5.3)
SODIUM: 142 meq/L (ref 137–147)

## 2013-10-30 LAB — CBC
HEMATOCRIT: 38 % (ref 36.0–46.0)
HEMOGLOBIN: 12.9 g/dL (ref 12.0–15.0)
MCH: 28.7 pg (ref 26.0–34.0)
MCHC: 33.9 g/dL (ref 30.0–36.0)
MCV: 84.6 fL (ref 78.0–100.0)
Platelets: 236 10*3/uL (ref 150–400)
RBC: 4.49 MIL/uL (ref 3.87–5.11)
RDW: 14.2 % (ref 11.5–15.5)
WBC: 8.7 10*3/uL (ref 4.0–10.5)

## 2013-10-30 LAB — ABO/RH: ABO/RH(D): O POS

## 2013-10-30 LAB — PROTIME-INR
INR: 0.93 (ref 0.00–1.49)
Prothrombin Time: 12.3 seconds (ref 11.6–15.2)

## 2013-10-30 LAB — TYPE AND SCREEN
ABO/RH(D): O POS
Antibody Screen: NEGATIVE

## 2013-10-30 LAB — APTT: aPTT: 32 seconds (ref 24–37)

## 2013-10-30 MED ORDER — CHLORHEXIDINE GLUCONATE 4 % EX LIQD: 60.0000 mL | Freq: Once | CUTANEOUS | Status: DC

## 2013-10-30 NOTE — H&P (Signed)
Joan Vargas is an 58 y.o. female.    Chief Complaint: right knee pain  HPI: Pt is a 58 y.o. female complaining of right knee pain for multiple years. Pain had continually increased since the beginning. X-rays in the clinic show end-stage arthritic changes of the right knee. Pt has tried various conservative treatments which have failed to alleviate their symptoms, including shots and therapy. Various options are discussed with the patient. Risks, benefits and expectations were discussed with the patient. Patient understand the risks, benefits and expectations and wishes to proceed with surgery.   PCP:  Rachell Cipro, MD  D/C Plans:  Home with HHPT  PMH: Past Medical History  Diagnosis Date  . Hypertension   . Depression   . Fibromyalgia   . Restless leg   . Diabetes mellitus     diet controlled not on meds   . Anemia   . Headache   . Arthritis     knees and thumbs     PSH: Past Surgical History  Procedure Laterality Date  . Appendectomy  1956  . Abdominal hysterectomy  1986  . Cesarean section  1984, 1986  . Knee arthroscopy  2000    right  . Oophorectomy    . Thyroidectomy  11/04/2011    Procedure: THYROIDECTOMY;  Surgeon: Earnstine Regal, MD;  Location: [facility];  Service: General;  Laterality: N/A;  Total Thyroidectomy    Social History:  reports that she quit smoking about 32 years ago. She has never used smokeless tobacco. She reports that she does not drink alcohol or use illicit drugs.  Allergies:  Allergies  Allergen Reactions  . Aspirin Nausea And Vomiting    REACTION: stomach upset    Medications: No current facility-administered medications for this encounter.   Current Outpatient Prescriptions  Medication Sig Dispense Refill  . aspirin 81 MG tablet Take 81 mg by mouth at bedtime.       Marland Kitchen BAYER BREEZE 2 TEST DISK       . Canagliflozin (INVOKANA) 100 MG TABS Take 100 mg by mouth daily before breakfast.      . cetirizine (ZYRTEC) 10 MG tablet Take 10 mg  by mouth at bedtime.       Marland Kitchen CINNAMON PO Take 2,000 mg by mouth daily with supper.       . diclofenac sodium (VOLTAREN) 1 % GEL Apply 1 application topically 2 (two) times daily. Pain for thumb and knees      . estradiol (ESTRACE) 0.1 MG/GM vaginal cream Place vaginally every Saturday.       . fluticasone (FLONASE) 50 MCG/ACT nasal spray Place 1 spray into both nostrils at bedtime.       . Ginger, Zingiber officinalis, (GINGER ROOT) 550 MG CAPS Take 550 mg by mouth 2 (two) times daily.      . Glucosamine-Chondroitin (MOVE FREE PO) Take 1 capsule by mouth every morning.      . Glucose Blood (ADVOCATE TEST VI)       . ibuprofen (ADVIL,MOTRIN) 200 MG tablet Take 400 mg by mouth at bedtime.      Elmore Guise Devices (LANCING DEVICE) MISC       . Lancets (TGT LANCET ULTRA THIN 30G) MISC       . levothyroxine (SYNTHROID, LEVOTHROID) 175 MCG tablet Take 175 mcg by mouth daily before breakfast.      . losartan-hydrochlorothiazide (HYZAAR) 100-25 MG per tablet Take 1 tablet by mouth every morning.       Marland Kitchen  metFORMIN (GLUCOPHAGE) 1000 MG tablet Take 1,000 mg by mouth 2 (two) times daily with a meal.      . metoprolol succinate (TOPROL-XL) 50 MG 24 hr tablet Take 50 mg by mouth every morning. Take with or immediately following a meal.      . montelukast (SINGULAIR) 10 MG tablet Take 10 mg by mouth every morning.      . Multiple Vitamin (MULTIVITAMIN) tablet Take 1 tablet by mouth every morning.       Marland Kitchen omega-3 acid ethyl esters (LOVAZA) 1 G capsule Take 2 g by mouth 2 (two) times daily.       Marland Kitchen omeprazole (PRILOSEC) 20 MG capsule Take 20 mg by mouth every morning.      . pramipexole (MIRAPEX) 0.25 MG tablet Take 0.25 mg by mouth at bedtime.      . pregabalin (LYRICA) 150 MG capsule Take 150 mg by mouth 2 (two) times daily.         No results found for this or any previous visit (from the past 48 hour(s)). No results found.  ROS: Pain with rom of the right lower extremity  Physical Exam:  Alert and  oriented 58 y.o. female in no acute distress Cranial nerves 2-12 intact Cervical spine: full rom with no tenderness, nv intact distally Chest: active breath sounds bilaterally, no wheeze rhonchi or rales Heart: regular rate and rhythm, no murmur Abd: non tender non distended with active bowel sounds Hip is stable with rom  Moderate tenderness and crepitus with rom, right knee nv intact distally No rashes or edema  Assessment/Plan Assessment: right knee end stage osteoarthritis   Plan: Patient will undergo a right total knee arthroplasty by Dr. Veverly Fells at [facility]. Risks benefits and expectations were discussed with the patient. Patient understand risks, benefits and expectations and wishes to proceed.

## 2013-10-30 NOTE — Pre-Procedure Instructions (Signed)
Joan Vargas  10/30/2013   Your procedure is scheduled on:  Nov 09, 2013 @ 7:30 am  Report to [facility] Admitting at 5:30 AM.  Call this number if you have problems the morning of surgery: [phone]   Remember:   Do not eat food or drink liquids after midnight.   Take these medicines the morning of surgery with A SIP OF WATER: levothyroxine (SYNTHROID, LEVOTHROID), metoprolol succinate (TOPROL-XL), montelukast (SINGULAIR),  pregabalin (LYRICA)    STOP ASPIRIN,  PRODUCTS CONTAINING ASPIRIN  , HERBAL MEDICATIONS, DICLOFENAC ONE WEEK PRIOR TO SURGERY    Do not wear jewelry, make-up or nail polish.  Do not wear lotions, powders, or perfumes. You may wear deodorant.  Do not shave 48 hours prior to surgery.   Do not bring valuables to the hospital.  [facility] is not responsible   for any belongings or valuables.               Contacts, dentures or bridgework may not be worn into surgery.  Leave suitcase in the car. After surgery it may be brought to your room.  For patients admitted to the hospital, discharge time is determined by your  treatment team.               Patients discharged the day of surgery will not be allowed to drive  home.  Name and phone number of your driver:      Please read over the following fact sheets that you were given: Pain Booklet, Coughing and Deep Breathing, Blood Transfusion Information and Surgical Site Infection Prevention

## 2013-10-31 LAB — SURGICAL PCR SCREEN
MRSA, PCR: NEGATIVE
Staphylococcus aureus: POSITIVE — AB

## 2013-10-31 NOTE — Progress Notes (Signed)
Mupirocin ointment rx called into [facility] on [address] at 8:05 AM today for positive PCR of staph. Pt notified and voiced understanding.

## 2013-11-02 NOTE — Progress Notes (Signed)
Anesthesia Chart Review:  Patient is a 58 year old female scheduled for right TKA on 11/09/13 by Dr. Veverly Fells.  History includes former smoker, HTN, DM2, fibromyalgia, arthritis, headaches, RLS, thyroidectomy with secondary hypothyroidism.  PCP is Dr. Rachell Cipro who cleared patient with "intermediate risk."  Preoperative EKG, CXR, and labs appear acceptable for OR.  She will get a fasting glucose on arrival.  If no acute changes then I would anticipate that she could proceed as planned.  George Hugh [facility]/Anesthesiology Phone [phone] 11/02/2013 1:18 PM

## 2013-11-08 MED ORDER — CEFAZOLIN SODIUM-DEXTROSE 2-3 GM-% IV SOLR
2.0000 g | INTRAVENOUS | Status: AC
  Administered 2013-11-09: 2 g via INTRAVENOUS
  Filled 2013-11-08: qty 50

## 2013-11-09 ENCOUNTER — Encounter (HOSPITAL_COMMUNITY)
Admission: RE | Disposition: A | Discharge status: Home Health | Payer: Self-pay | Source: Ambulatory Visit | Attending: Orthopedic Surgery

## 2013-11-09 ENCOUNTER — Encounter (HOSPITAL_COMMUNITY): Payer: Self-pay | Admitting: Anesthesiology

## 2013-11-09 ENCOUNTER — Inpatient Hospital Stay (HOSPITAL_COMMUNITY)
Admission: RE | Admit: 2013-11-09 | Discharge: 2013-11-11 | DRG: 470 | Disposition: A | Discharge status: Home Health | Payer: BC Managed Care – PPO | Source: Ambulatory Visit | Attending: Orthopedic Surgery | Admitting: Orthopedic Surgery

## 2013-11-09 ENCOUNTER — Ambulatory Visit (HOSPITAL_COMMUNITY): Payer: BC Managed Care – PPO | Admitting: Anesthesiology

## 2013-11-09 ENCOUNTER — Encounter (HOSPITAL_COMMUNITY): Payer: BC Managed Care – PPO | Admitting: Vascular Surgery

## 2013-11-09 ENCOUNTER — Inpatient Hospital Stay (HOSPITAL_COMMUNITY): Payer: BC Managed Care – PPO

## 2013-11-09 DIAGNOSIS — E119 Type 2 diabetes mellitus without complications: Secondary | ICD-10-CM | POA: Diagnosis present

## 2013-11-09 DIAGNOSIS — G8918 Other acute postprocedural pain: Secondary | ICD-10-CM | POA: Diagnosis not present

## 2013-11-09 DIAGNOSIS — Z6838 Body mass index (BMI) 38.0-38.9, adult: Secondary | ICD-10-CM

## 2013-11-09 DIAGNOSIS — Z7901 Long term (current) use of anticoagulants: Secondary | ICD-10-CM

## 2013-11-09 DIAGNOSIS — I10 Essential (primary) hypertension: Secondary | ICD-10-CM | POA: Diagnosis present

## 2013-11-09 DIAGNOSIS — F329 Major depressive disorder, single episode, unspecified: Secondary | ICD-10-CM | POA: Diagnosis present

## 2013-11-09 DIAGNOSIS — IMO0002 Reserved for concepts with insufficient information to code with codable children: Secondary | ICD-10-CM | POA: Diagnosis present

## 2013-11-09 DIAGNOSIS — F3289 Other specified depressive episodes: Secondary | ICD-10-CM | POA: Diagnosis present

## 2013-11-09 DIAGNOSIS — Z9089 Acquired absence of other organs: Secondary | ICD-10-CM

## 2013-11-09 DIAGNOSIS — Z87891 Personal history of nicotine dependence: Secondary | ICD-10-CM

## 2013-11-09 DIAGNOSIS — G2581 Restless legs syndrome: Secondary | ICD-10-CM | POA: Diagnosis present

## 2013-11-09 DIAGNOSIS — Z7982 Long term (current) use of aspirin: Secondary | ICD-10-CM

## 2013-11-09 DIAGNOSIS — Z886 Allergy status to analgesic agent status: Secondary | ICD-10-CM

## 2013-11-09 DIAGNOSIS — IMO0001 Reserved for inherently not codable concepts without codable children: Secondary | ICD-10-CM | POA: Diagnosis present

## 2013-11-09 DIAGNOSIS — K219 Gastro-esophageal reflux disease without esophagitis: Secondary | ICD-10-CM | POA: Diagnosis present

## 2013-11-09 DIAGNOSIS — Z79899 Other long term (current) drug therapy: Secondary | ICD-10-CM

## 2013-11-09 DIAGNOSIS — M1710 Unilateral primary osteoarthritis, unspecified knee: Principal | ICD-10-CM | POA: Diagnosis present

## 2013-11-09 HISTORY — PX: TOTAL KNEE ARTHROPLASTY: SHX125

## 2013-11-09 LAB — GLUCOSE, CAPILLARY
GLUCOSE-CAPILLARY: 237 mg/dL — AB (ref 70–99)
GLUCOSE-CAPILLARY: 221 mg/dL — AB (ref 70–99)
Glucose-Capillary: 159 mg/dL — ABNORMAL HIGH (ref 70–99)
Glucose-Capillary: 165 mg/dL — ABNORMAL HIGH (ref 70–99)
Glucose-Capillary: 164 mg/dL — ABNORMAL HIGH (ref 70–99)

## 2013-11-09 SURGERY — ARTHROPLASTY, KNEE, TOTAL
Anesthesia: General | Site: Knee | Laterality: Right

## 2013-11-09 MED ORDER — METOPROLOL SUCCINATE ER 50 MG PO TB24
50.0000 mg | ORAL_TABLET | Freq: Every morning | ORAL | Status: DC
  Administered 2013-11-10 – 2013-11-11 (×2): 50 mg via ORAL
  Filled 2013-11-09 (×2): qty 1

## 2013-11-09 MED ORDER — SODIUM CHLORIDE 0.9 % IV SOLN
INTRAVENOUS | Status: DC
  Administered 2013-11-09 – 2013-11-10 (×2): via INTRAVENOUS

## 2013-11-09 MED ORDER — DEXAMETHASONE SODIUM PHOSPHATE 4 MG/ML IJ SOLN
INTRAMUSCULAR | Status: AC
  Filled 2013-11-09: qty 1

## 2013-11-09 MED ORDER — HYDROMORPHONE HCL PF 1 MG/ML IJ SOLN
INTRAMUSCULAR | Status: AC
  Filled 2013-11-09: qty 1

## 2013-11-09 MED ORDER — FLUTICASONE PROPIONATE 50 MCG/ACT NA SUSP
1.0000 | Freq: Every day | NASAL | Status: DC
  Administered 2013-11-09: 1 via NASAL
  Filled 2013-11-09: qty 16

## 2013-11-09 MED ORDER — GLYCOPYRROLATE 0.2 MG/ML IJ SOLN
INTRAMUSCULAR | Status: DC | PRN
  Administered 2013-11-09: 0.4 mg via INTRAVENOUS

## 2013-11-09 MED ORDER — SCOPOLAMINE 1 MG/3DAYS TD PT72
MEDICATED_PATCH | TRANSDERMAL | Status: AC
  Filled 2013-11-09: qty 1

## 2013-11-09 MED ORDER — METHOCARBAMOL 1000 MG/10ML IJ SOLN
500.0000 mg | Freq: Four times a day (QID) | INTRAVENOUS | Status: DC | PRN
  Administered 2013-11-09: 500 mg via INTRAVENOUS
  Filled 2013-11-09: qty 5

## 2013-11-09 MED ORDER — METFORMIN HCL 500 MG PO TABS
1000.0000 mg | ORAL_TABLET | Freq: Two times a day (BID) | ORAL | Status: DC
  Administered 2013-11-09 – 2013-11-11 (×4): 1000 mg via ORAL
  Filled 2013-11-09 (×6): qty 2

## 2013-11-09 MED ORDER — LOSARTAN POTASSIUM-HCTZ 100-25 MG PO TABS: 1.0000 | ORAL_TABLET | Freq: Every morning | ORAL | Status: DC

## 2013-11-09 MED ORDER — LIDOCAINE HCL (CARDIAC) 20 MG/ML IV SOLN
INTRAVENOUS | Status: AC
Start: 2013-11-09 — End: 2013-11-09
  Filled 2013-11-09: qty 5

## 2013-11-09 MED ORDER — LACTATED RINGERS IV SOLN
INTRAVENOUS | Status: DC | PRN
  Administered 2013-11-09: 07:00:00 via INTRAVENOUS

## 2013-11-09 MED ORDER — ONDANSETRON HCL 4 MG/2ML IJ SOLN
4.0000 mg | Freq: Four times a day (QID) | INTRAMUSCULAR | Status: DC | PRN
  Filled 2013-11-09: qty 2

## 2013-11-09 MED ORDER — LEVOTHYROXINE SODIUM 175 MCG PO TABS
175.0000 ug | ORAL_TABLET | Freq: Every day | ORAL | Status: DC
  Administered 2013-11-10 – 2013-11-11 (×2): 175 ug via ORAL
  Filled 2013-11-09 (×3): qty 1

## 2013-11-09 MED ORDER — PATIENT'S GUIDE TO USING COUMADIN BOOK
Freq: Once | Status: AC
  Administered 2013-11-09: 18:00:00
  Filled 2013-11-09: qty 1

## 2013-11-09 MED ORDER — ONDANSETRON HCL 4 MG/2ML IJ SOLN: 4.0000 mg | Freq: Once | INTRAMUSCULAR | Status: DC | PRN

## 2013-11-09 MED ORDER — LORATADINE 10 MG PO TABS
10.0000 mg | ORAL_TABLET | Freq: Every day | ORAL | Status: DC
  Administered 2013-11-09 – 2013-11-11 (×3): 10 mg via ORAL
  Filled 2013-11-09 (×3): qty 1

## 2013-11-09 MED ORDER — ONE-DAILY MULTI VITAMINS PO TABS: 1.0000 | ORAL_TABLET | Freq: Every morning | ORAL | Status: DC

## 2013-11-09 MED ORDER — INSULIN ASPART 100 UNIT/ML ~~LOC~~ SOLN: 0.0000 [IU] | Freq: Every day | SUBCUTANEOUS | Status: DC

## 2013-11-09 MED ORDER — OXYCODONE-ACETAMINOPHEN 5-325 MG PO TABS: 1.0000 | ORAL_TABLET | ORAL | Status: DC | PRN

## 2013-11-09 MED ORDER — INSULIN ASPART 100 UNIT/ML ~~LOC~~ SOLN
0.0000 [IU] | Freq: Three times a day (TID) | SUBCUTANEOUS | Status: DC
  Administered 2013-11-09: 4 [IU] via SUBCUTANEOUS
  Administered 2013-11-10: 7 [IU] via SUBCUTANEOUS
  Filled 2013-11-09 (×26): qty 0.2

## 2013-11-09 MED ORDER — FENTANYL CITRATE 0.05 MG/ML IJ SOLN
INTRAMUSCULAR | Status: AC
  Filled 2013-11-09: qty 5

## 2013-11-09 MED ORDER — MIDAZOLAM HCL 2 MG/2ML IJ SOLN
INTRAMUSCULAR | Status: AC
  Filled 2013-11-09: qty 2

## 2013-11-09 MED ORDER — GLYCOPYRROLATE 0.2 MG/ML IJ SOLN
INTRAMUSCULAR | Status: AC
  Filled 2013-11-09: qty 2

## 2013-11-09 MED ORDER — ASPIRIN 81 MG PO TABS: 81.0000 mg | ORAL_TABLET | Freq: Every day | ORAL | Status: DC

## 2013-11-09 MED ORDER — ESTRADIOL 0.1 MG/GM VA CREA
1.0000 | TOPICAL_CREAM | VAGINAL | Status: DC
  Filled 2013-11-09: qty 42.5

## 2013-11-09 MED ORDER — HYDROCHLOROTHIAZIDE 25 MG PO TABS
25.0000 mg | ORAL_TABLET | Freq: Every day | ORAL | Status: DC
  Administered 2013-11-09 – 2013-11-11 (×3): 25 mg via ORAL
  Filled 2013-11-09 (×3): qty 1

## 2013-11-09 MED ORDER — SODIUM CHLORIDE 0.9 % IR SOLN
Status: DC | PRN
  Administered 2013-11-09: 3000 mL
  Administered 2013-11-09: 1000 mL

## 2013-11-09 MED ORDER — OXYCODONE HCL 5 MG PO TABS: 5.0000 mg | ORAL_TABLET | Freq: Once | ORAL | Status: DC | PRN

## 2013-11-09 MED ORDER — DICLOFENAC SODIUM 1 % TD GEL
1.0000 "application " | Freq: Two times a day (BID) | TRANSDERMAL | Status: DC
  Administered 2013-11-09: 1 via TOPICAL
  Filled 2013-11-09: qty 100

## 2013-11-09 MED ORDER — ONDANSETRON HCL 4 MG/2ML IJ SOLN
INTRAMUSCULAR | Status: AC
  Filled 2013-11-09: qty 2

## 2013-11-09 MED ORDER — PRAMIPEXOLE DIHYDROCHLORIDE 0.25 MG PO TABS
0.2500 mg | ORAL_TABLET | Freq: Every day | ORAL | Status: DC
  Administered 2013-11-09 – 2013-11-10 (×2): 0.25 mg via ORAL
  Filled 2013-11-09 (×3): qty 1

## 2013-11-09 MED ORDER — CEFAZOLIN SODIUM-DEXTROSE 2-3 GM-% IV SOLR
2.0000 g | Freq: Four times a day (QID) | INTRAVENOUS | Status: AC
  Administered 2013-11-09 (×2): 2 g via INTRAVENOUS
  Filled 2013-11-09 (×2): qty 50

## 2013-11-09 MED ORDER — LIDOCAINE HCL (CARDIAC) 20 MG/ML IV SOLN
INTRAVENOUS | Status: DC | PRN
  Administered 2013-11-09: 40 mg via INTRAVENOUS

## 2013-11-09 MED ORDER — ADULT MULTIVITAMIN W/MINERALS CH
1.0000 | ORAL_TABLET | Freq: Every day | ORAL | Status: DC
  Administered 2013-11-09 – 2013-11-11 (×3): 1 via ORAL
  Filled 2013-11-09 (×3): qty 1

## 2013-11-09 MED ORDER — METOCLOPRAMIDE HCL 5 MG/ML IJ SOLN: 5.0000 mg | Freq: Three times a day (TID) | INTRAMUSCULAR | Status: DC | PRN

## 2013-11-09 MED ORDER — PANTOPRAZOLE SODIUM 40 MG PO TBEC
80.0000 mg | DELAYED_RELEASE_TABLET | Freq: Every day | ORAL | Status: DC
  Administered 2013-11-09 – 2013-11-11 (×3): 80 mg via ORAL
  Filled 2013-11-09 (×3): qty 2

## 2013-11-09 MED ORDER — ACETAMINOPHEN 650 MG RE SUPP: 650.0000 mg | Freq: Four times a day (QID) | RECTAL | Status: DC | PRN

## 2013-11-09 MED ORDER — CANAGLIFLOZIN 100 MG PO TABS: 100.0000 mg | ORAL_TABLET | Freq: Every day | ORAL | Status: DC

## 2013-11-09 MED ORDER — NEOSTIGMINE METHYLSULFATE 10 MG/10ML IV SOLN
INTRAVENOUS | Status: DC | PRN
  Administered 2013-11-09: 3 mg via INTRAVENOUS

## 2013-11-09 MED ORDER — MIDAZOLAM HCL 5 MG/5ML IJ SOLN
INTRAMUSCULAR | Status: DC | PRN
  Administered 2013-11-09: 2 mg via INTRAVENOUS

## 2013-11-09 MED ORDER — MENTHOL 3 MG MT LOZG: 1.0000 | LOZENGE | OROMUCOSAL | Status: DC | PRN

## 2013-11-09 MED ORDER — METOCLOPRAMIDE HCL 10 MG PO TABS: 5.0000 mg | ORAL_TABLET | Freq: Three times a day (TID) | ORAL | Status: DC | PRN

## 2013-11-09 MED ORDER — WARFARIN - PHARMACIST DOSING INPATIENT: Freq: Every day | Status: DC

## 2013-11-09 MED ORDER — HYDROMORPHONE HCL PF 1 MG/ML IJ SOLN: 0.2500 mg | INTRAMUSCULAR | Status: DC | PRN

## 2013-11-09 MED ORDER — METFORMIN HCL 500 MG PO TABS: 1000.0000 mg | ORAL_TABLET | Freq: Two times a day (BID) | ORAL | Status: DC

## 2013-11-09 MED ORDER — SCOPOLAMINE 1 MG/3DAYS TD PT72
MEDICATED_PATCH | TRANSDERMAL | Status: DC | PRN
  Administered 2013-11-09: 1 via TRANSDERMAL

## 2013-11-09 MED ORDER — PROPOFOL 10 MG/ML IV BOLUS
INTRAVENOUS | Status: DC | PRN
  Administered 2013-11-09: 200 mg via INTRAVENOUS

## 2013-11-09 MED ORDER — LOSARTAN POTASSIUM 50 MG PO TABS
100.0000 mg | ORAL_TABLET | Freq: Every day | ORAL | Status: DC
  Administered 2013-11-09 – 2013-11-11 (×3): 100 mg via ORAL
  Filled 2013-11-09 (×3): qty 2

## 2013-11-09 MED ORDER — INSULIN ASPART 100 UNIT/ML ~~LOC~~ SOLN: 6.0000 [IU] | Freq: Three times a day (TID) | SUBCUTANEOUS | Status: DC

## 2013-11-09 MED ORDER — ROCURONIUM BROMIDE 50 MG/5ML IV SOLN
INTRAVENOUS | Status: AC
  Filled 2013-11-09: qty 1

## 2013-11-09 MED ORDER — MONTELUKAST SODIUM 10 MG PO TABS
10.0000 mg | ORAL_TABLET | Freq: Every morning | ORAL | Status: DC
  Administered 2013-11-09 – 2013-11-11 (×3): 10 mg via ORAL
  Filled 2013-11-09 (×3): qty 1

## 2013-11-09 MED ORDER — HYDROMORPHONE HCL PF 1 MG/ML IJ SOLN
0.5000 mg | INTRAMUSCULAR | Status: DC | PRN
  Administered 2013-11-09 (×2): 1 mg via INTRAVENOUS
  Filled 2013-11-09 (×2): qty 1

## 2013-11-09 MED ORDER — HYDROMORPHONE HCL PF 1 MG/ML IJ SOLN
0.2500 mg | INTRAMUSCULAR | Status: DC | PRN
Start: 2013-11-09 — End: 2013-11-09
  Administered 2013-11-09 (×6): 0.5 mg via INTRAVENOUS

## 2013-11-09 MED ORDER — PREGABALIN 75 MG PO CAPS
150.0000 mg | ORAL_CAPSULE | Freq: Two times a day (BID) | ORAL | Status: DC
  Administered 2013-11-09 – 2013-11-11 (×4): 150 mg via ORAL
  Filled 2013-11-09 (×4): qty 2

## 2013-11-09 MED ORDER — ACETAMINOPHEN 325 MG PO TABS
650.0000 mg | ORAL_TABLET | Freq: Four times a day (QID) | ORAL | Status: DC | PRN
  Administered 2013-11-10 (×3): 650 mg via ORAL
  Filled 2013-11-09 (×3): qty 2

## 2013-11-09 MED ORDER — PREGABALIN 75 MG PO CAPS: 150.0000 mg | ORAL_CAPSULE | Freq: Two times a day (BID) | ORAL | Status: DC

## 2013-11-09 MED ORDER — NEOSTIGMINE METHYLSULFATE 10 MG/10ML IV SOLN
INTRAVENOUS | Status: AC
  Filled 2013-11-09: qty 1

## 2013-11-09 MED ORDER — METHOCARBAMOL 500 MG PO TABS: 500.0000 mg | ORAL_TABLET | Freq: Three times a day (TID) | ORAL | Status: DC | PRN

## 2013-11-09 MED ORDER — OXYCODONE HCL 5 MG/5ML PO SOLN: 5.0000 mg | Freq: Once | ORAL | Status: DC | PRN

## 2013-11-09 MED ORDER — ONDANSETRON HCL 4 MG PO TABS
4.0000 mg | ORAL_TABLET | Freq: Four times a day (QID) | ORAL | Status: DC | PRN
  Administered 2013-11-10: 4 mg via ORAL
  Filled 2013-11-09: qty 1

## 2013-11-09 MED ORDER — OMEGA-3-ACID ETHYL ESTERS 1 G PO CAPS
2.0000 g | ORAL_CAPSULE | Freq: Two times a day (BID) | ORAL | Status: DC
  Administered 2013-11-09 – 2013-11-11 (×5): 2 g via ORAL
  Filled 2013-11-09 (×7): qty 2

## 2013-11-09 MED ORDER — PHENOL 1.4 % MT LIQD: 1.0000 | OROMUCOSAL | Status: DC | PRN

## 2013-11-09 MED ORDER — DEXAMETHASONE SODIUM PHOSPHATE 4 MG/ML IJ SOLN
INTRAMUSCULAR | Status: DC | PRN
  Administered 2013-11-09: 4 mg via INTRAVENOUS

## 2013-11-09 MED ORDER — WARFARIN VIDEO: Freq: Once | Status: DC

## 2013-11-09 MED ORDER — BISACODYL 10 MG RE SUPP
10.0000 mg | Freq: Every day | RECTAL | Status: DC | PRN
Start: 2013-11-09 — End: 2013-11-11

## 2013-11-09 MED ORDER — WARFARIN SODIUM 7.5 MG PO TABS
7.5000 mg | ORAL_TABLET | Freq: Once | ORAL | Status: AC
  Administered 2013-11-09: 7.5 mg via ORAL
  Filled 2013-11-09: qty 1

## 2013-11-09 MED ORDER — ONDANSETRON HCL 4 MG/2ML IJ SOLN
INTRAMUSCULAR | Status: DC | PRN
  Administered 2013-11-09: 4 mg via INTRAVENOUS

## 2013-11-09 MED ORDER — CANAGLIFLOZIN 100 MG PO TABS
100.0000 mg | ORAL_TABLET | Freq: Every day | ORAL | Status: DC
  Administered 2013-11-10 – 2013-11-11 (×2): 100 mg via ORAL
  Filled 2013-11-09 (×3): qty 1

## 2013-11-09 MED ORDER — METHOCARBAMOL 500 MG PO TABS
500.0000 mg | ORAL_TABLET | Freq: Four times a day (QID) | ORAL | Status: DC | PRN
  Administered 2013-11-09 – 2013-11-11 (×6): 500 mg via ORAL
  Filled 2013-11-09 (×6): qty 1

## 2013-11-09 MED ORDER — PROPOFOL 10 MG/ML IV BOLUS
INTRAVENOUS | Status: AC
  Filled 2013-11-09: qty 20

## 2013-11-09 MED ORDER — ASPIRIN EC 81 MG PO TBEC
81.0000 mg | DELAYED_RELEASE_TABLET | Freq: Every day | ORAL | Status: DC
  Administered 2013-11-09 – 2013-11-10 (×2): 81 mg via ORAL
  Filled 2013-11-09 (×3): qty 1

## 2013-11-09 MED ORDER — WARFARIN SODIUM 5 MG PO TABS: 5.0000 mg | ORAL_TABLET | Freq: Every day | ORAL | Status: DC

## 2013-11-09 MED ORDER — FENTANYL CITRATE 0.05 MG/ML IJ SOLN
INTRAMUSCULAR | Status: DC | PRN
  Administered 2013-11-09 (×5): 50 ug via INTRAVENOUS

## 2013-11-09 MED ORDER — ROCURONIUM BROMIDE 100 MG/10ML IV SOLN
INTRAVENOUS | Status: DC | PRN
  Administered 2013-11-09: 50 mg via INTRAVENOUS

## 2013-11-09 MED ORDER — OXYCODONE HCL 5 MG PO TABS
5.0000 mg | ORAL_TABLET | ORAL | Status: DC | PRN
  Administered 2013-11-09 – 2013-11-11 (×12): 10 mg via ORAL
  Filled 2013-11-09 (×12): qty 2

## 2013-11-09 SURGICAL SUPPLY — 56 items
BANDAGE ELASTIC 6 VELCRO ST LF (GAUZE/BANDAGES/DRESSINGS) ×2 IMPLANT
BANDAGE ESMARK 6X9 LF (GAUZE/BANDAGES/DRESSINGS) ×1 IMPLANT
BANDAGE GAUZE ELAST BULKY 4 IN (GAUZE/BANDAGES/DRESSINGS) ×4 IMPLANT
BLADE SAG 18X100X1.27 (BLADE) ×2 IMPLANT
BLADE SAW SGTL 13.0X1.19X90.0M (BLADE) ×2 IMPLANT
BNDG ELASTIC 6X10 VLCR STRL LF (GAUZE/BANDAGES/DRESSINGS) ×2 IMPLANT
BNDG ESMARK 6X9 LF (GAUZE/BANDAGES/DRESSINGS) ×2
BOWL SMART MIX CTS (DISPOSABLE) ×2 IMPLANT
CAPT RP KNEE ×2 IMPLANT
CEMENT HV SMART SET (Cement) ×4 IMPLANT
COVER SURGICAL LIGHT HANDLE (MISCELLANEOUS) ×2 IMPLANT
CUFF TOURNIQUET SINGLE 34IN LL (TOURNIQUET CUFF) ×2 IMPLANT
CUFF TOURNIQUET SINGLE 44IN (TOURNIQUET CUFF) IMPLANT
DRAPE EXTREMITY T 121X128X90 (DRAPE) ×2 IMPLANT
DRAPE PROXIMA HALF (DRAPES) ×2 IMPLANT
DRAPE U-SHAPE 47X51 STRL (DRAPES) ×2 IMPLANT
DRSG ADAPTIC 3X8 NADH LF (GAUZE/BANDAGES/DRESSINGS) ×2 IMPLANT
DRSG OPSITE 11X17.75 LRG (GAUZE/BANDAGES/DRESSINGS) ×2 IMPLANT
DRSG PAD ABDOMINAL 8X10 ST (GAUZE/BANDAGES/DRESSINGS) ×4 IMPLANT
DURAPREP 26ML APPLICATOR (WOUND CARE) ×2 IMPLANT
ELECT CAUTERY BLADE 6.4 (BLADE) ×2 IMPLANT
ELECT REM PT RETURN 9FT ADLT (ELECTROSURGICAL) ×4
ELECTRODE REM PT RTRN 9FT ADLT (ELECTROSURGICAL) ×2 IMPLANT
GLOVE BIOGEL PI ORTHO PRO 7.5 (GLOVE) ×1
GLOVE BIOGEL PI ORTHO PRO SZ8 (GLOVE) ×1
GLOVE ORTHO TXT STRL SZ7.5 (GLOVE) ×2 IMPLANT
GLOVE PI ORTHO PRO STRL 7.5 (GLOVE) ×1 IMPLANT
GLOVE PI ORTHO PRO STRL SZ8 (GLOVE) ×1 IMPLANT
GLOVE SURG ORTHO 8.5 STRL (GLOVE) ×2 IMPLANT
GOWN STRL REUS W/ TWL XL LVL3 (GOWN DISPOSABLE) ×3 IMPLANT
GOWN STRL REUS W/TWL XL LVL3 (GOWN DISPOSABLE) ×3
HANDPIECE INTERPULSE COAX TIP (DISPOSABLE) ×1
IMMOBILIZER KNEE 22 (SOFTGOODS) ×2 IMPLANT
IMMOBILIZER KNEE 22 UNIV (SOFTGOODS) IMPLANT
KIT BASIN OR (CUSTOM PROCEDURE TRAY) ×2 IMPLANT
KIT MANIFOLD (MISCELLANEOUS) ×2 IMPLANT
KIT ROOM TURNOVER OR (KITS) ×2 IMPLANT
MANIFOLD NEPTUNE II (INSTRUMENTS) ×2 IMPLANT
NS IRRIG 1000ML POUR BTL (IV SOLUTION) ×2 IMPLANT
PACK TOTAL JOINT (CUSTOM PROCEDURE TRAY) ×2 IMPLANT
PAD ARMBOARD 7.5X6 YLW CONV (MISCELLANEOUS) ×4 IMPLANT
SET HNDPC FAN SPRY TIP SCT (DISPOSABLE) ×1 IMPLANT
SPONGE GAUZE 4X4 12PLY (GAUZE/BANDAGES/DRESSINGS) ×2 IMPLANT
STRIP CLOSURE SKIN 1/2X4 (GAUZE/BANDAGES/DRESSINGS) ×4 IMPLANT
SUCTION FRAZIER TIP 10 FR DISP (SUCTIONS) ×2 IMPLANT
SUT MNCRL AB 3-0 PS2 18 (SUTURE) ×2 IMPLANT
SUT VIC AB 0 CT1 27 (SUTURE) ×2
SUT VIC AB 0 CT1 27XBRD ANBCTR (SUTURE) ×2 IMPLANT
SUT VIC AB 1 CT1 27 (SUTURE) ×3
SUT VIC AB 1 CT1 27XBRD ANBCTR (SUTURE) ×3 IMPLANT
SUT VIC AB 2-0 CT1 27 (SUTURE) ×2
SUT VIC AB 2-0 CT1 TAPERPNT 27 (SUTURE) ×2 IMPLANT
TOWEL OR 17X24 6PK STRL BLUE (TOWEL DISPOSABLE) ×2 IMPLANT
TOWEL OR 17X26 10 PK STRL BLUE (TOWEL DISPOSABLE) ×2 IMPLANT
TRAY FOLEY CATH 16FRSI W/METER (SET/KITS/TRAYS/PACK) ×2 IMPLANT
WATER STERILE IRR 1000ML POUR (IV SOLUTION) ×4 IMPLANT

## 2013-11-09 NOTE — Discharge Instructions (Signed)
Do NOT PROP ANYTHING UNDER THE KNEE.  Ice to the knee at all times  Do exercises every hour at home, heel slides, calf pumps, quad sets  Home health PT,OT, RN - coumadin for INR 2.5-3.0  CPM 0-60, increase 5-10 deg per day 6-8 hours per day  Follow up in two weeks with Dr Veverly Fells [phone]

## 2013-11-09 NOTE — Interval H&P Note (Signed)
History and Physical Interval Note:  11/09/2013 7:29 AM  Joan Vargas  has presented today for surgery, with the diagnosis of RIGHT KNEE OA  The various methods of treatment have been discussed with the patient and family. After consideration of risks, benefits and other options for treatment, the patient has consented to  Procedure(s): RIGHT TOTAL KNEE ARTHROPLASTY (Right) as a surgical intervention .  The patient's history has been reviewed, patient examined, no change in status, stable for surgery.  I have reviewed the patient's chart and labs.  Questions were answered to the patient's satisfaction.     Augustin Schooling

## 2013-11-09 NOTE — Progress Notes (Signed)
ANTICOAGULATION CONSULT NOTE - Initial Consult  Pharmacy Consult for coumadin Indication: VTE prophylaxis  Allergies  Allergen Reactions  . Aspirin Nausea And Vomiting    REACTION: stomach upset    Patient Measurements: Height: 5' 1.5" (156.2 cm) Weight: 208 lb 0.6 oz (94.365 kg) IBW/kg (Calculated) : 48.95   Vital Signs: Temp: 97.9 F (36.6 C) (05/29 1113) Temp src: Oral (05/29 0615) BP: 115/62 mmHg (05/29 1113) Pulse Rate: 63 (05/29 1113)  Labs: No results found for this basename: HGB, HCT, PLT, APTT, LABPROT, INR, HEPARINUNFRC, CREATININE, CKTOTAL, CKMB, TROPONINI,  in the last 72 hours  Estimated Creatinine Clearance: 76.5 ml/min (by C-G formula based on Cr of 0.83).   Medical History: Past Medical History  Diagnosis Date  . Hypertension   . Depression   . Fibromyalgia   . Restless leg   . Diabetes mellitus     diet controlled not on meds   . Anemia   . Headache(784.0)   . Arthritis     knees and thumbs   . PONV (postoperative nausea and vomiting)     Medications:  Prescriptions prior to admission  Medication Sig Dispense Refill  . aspirin 81 MG tablet Take 81 mg by mouth at bedtime.       Marland Kitchen BAYER BREEZE 2 TEST DISK       . Canagliflozin (INVOKANA) 100 MG TABS Take 100 mg by mouth daily before breakfast.      . cetirizine (ZYRTEC) 10 MG tablet Take 10 mg by mouth at bedtime.       Marland Kitchen CINNAMON PO Take 2,000 mg by mouth daily with supper.       Marland Kitchen estradiol (ESTRACE) 0.1 MG/GM vaginal cream Place vaginally every Saturday.       . fluticasone (FLONASE) 50 MCG/ACT nasal spray Place 1 spray into both nostrils at bedtime.       . Ginger, Zingiber officinalis, (GINGER ROOT) 550 MG CAPS Take 550 mg by mouth 2 (two) times daily.      . Glucosamine-Chondroitin (MOVE FREE PO) Take 1 capsule by mouth every morning.      Marland Kitchen ibuprofen (ADVIL,MOTRIN) 200 MG tablet Take 400 mg by mouth at bedtime.      Marland Kitchen levothyroxine (SYNTHROID, LEVOTHROID) 175 MCG tablet Take 175 mcg by  mouth daily before breakfast.      . losartan-hydrochlorothiazide (HYZAAR) 100-25 MG per tablet Take 1 tablet by mouth every morning.       . metFORMIN (GLUCOPHAGE) 1000 MG tablet Take 1,000 mg by mouth 2 (two) times daily with a meal.      . metoprolol succinate (TOPROL-XL) 50 MG 24 hr tablet Take 50 mg by mouth every morning. Take with or immediately following a meal.      . montelukast (SINGULAIR) 10 MG tablet Take 10 mg by mouth every morning.      . Multiple Vitamin (MULTIVITAMIN) tablet Take 1 tablet by mouth every morning.       Marland Kitchen omega-3 acid ethyl esters (LOVAZA) 1 G capsule Take 2 g by mouth 2 (two) times daily.       Marland Kitchen omeprazole (PRILOSEC) 20 MG capsule Take 20 mg by mouth every morning.      . pramipexole (MIRAPEX) 0.25 MG tablet Take 0.25 mg by mouth at bedtime.      . pregabalin (LYRICA) 150 MG capsule Take 150 mg by mouth 2 (two) times daily.       . diclofenac sodium (VOLTAREN) 1 % GEL Apply 1 application  topically 2 (two) times daily. Pain for thumb and knees      . Glucose Blood (ADVOCATE TEST VI)       . Lancet Devices (LANCING DEVICE) MISC       . Lancets (TGT LANCET ULTRA THIN 30G) MISC         Assessment: 58 yo F s/p R TKR.  Pharmacy consulted to dose coumadin for VTE px.  Wt 94.4 kg. INR 0.93 on 5/19.  Baseline CBC wnl.  Coumadin score = 4.   Goal of Therapy:  INR 2-3 Monitor platelets by anticoagulation protocol: Yes   Plan:  1. Coumadin 7.5 mg po x 1 dose 2. Daily INR 3. Coumadin book and video ordered for education  Eudelia Bunch, Pharm.D. [phone] 11/09/2013 11:20 AM

## 2013-11-09 NOTE — Anesthesia Postprocedure Evaluation (Signed)
  Anesthesia Post-op Note  Patient: Joan Vargas  Procedure(s) Performed: Procedure(s): RIGHT TOTAL KNEE ARTHROPLASTY (Right)  Patient Location: PACU  Anesthesia Type:GA combined with regional for post-op pain  Level of Consciousness: awake, alert  and oriented  Airway and Oxygen Therapy: Patient Spontanous Breathing and Patient connected to nasal cannula oxygen  Post-op Pain: moderate  Post-op Assessment: Post-op Vital signs reviewed  Post-op Vital Signs: Reviewed  Last Vitals:  Filed Vitals:   11/09/13 1015  BP: 145/82  Pulse: 62  Temp:   Resp: 15    Complications: No apparent anesthesia complications

## 2013-11-09 NOTE — Anesthesia Procedure Notes (Addendum)
Anesthesia Regional Block:  Femoral nerve block  Pre-Anesthetic Checklist: ,, timeout performed, Correct Patient, Correct Site, Correct Laterality, Correct Procedure, Correct Position, site marked, Risks and benefits discussed,  Surgical consent,  Pre-op evaluation,  At surgeon's request and post-op pain management  Laterality: Right and Lower  Prep: chloraprep       Needles:  Injection technique: Single-shot  Needle Type: Echogenic Needle     Needle Length: 9cm 9 cm Needle Gauge: 21 and 21 G    Additional Needles:  Procedures: ultrasound guided (picture in chart) Femoral nerve block Narrative:  Start time: 11/09/2013 7:10 AM End time: 11/09/2013 7:16 AM Injection made incrementally with aspirations every 5 mL.  Performed by: Personally  Anesthesiologist: Lorrene Reid, MD   Procedure Name: Intubation Date/Time: 11/09/2013 7:40 AM Performed by: Kyung Rudd Pre-anesthesia Checklist: Patient identified, Emergency Drugs available, Suction available, Patient being monitored and Timeout performed Patient Re-evaluated:Patient Re-evaluated prior to inductionOxygen Delivery Method: Circle system utilized Preoxygenation: Pre-oxygenation with 100% oxygen Intubation Type: IV induction Ventilation: Mask ventilation without difficulty and Oral airway inserted - appropriate to patient size Laryngoscope Size: Mac and 4 Grade View: Grade I Tube type: Oral Tube size: 7.0 mm Number of attempts: 1 Airway Equipment and Method: Stylet Placement Confirmation: ETT inserted through vocal cords under direct vision,  positive ETCO2 and breath sounds checked- equal and bilateral Secured at: 21 cm Tube secured with: Tape Dental Injury: Teeth and Oropharynx as per pre-operative assessment

## 2013-11-09 NOTE — Progress Notes (Signed)
Dr, Al Corpus aware of increased Blood sugar, no new orders

## 2013-11-09 NOTE — Progress Notes (Signed)
Orthopedic Tech Progress Note Patient Details:  Joan Vargas 07-01-1955 [id number] CPM applied to RLE with appropriate settings. OHF applied to bed. CPM Right Knee CPM Right Knee: On Right Knee Flexion (Degrees): 60 Right Knee Extension (Degrees): 0   Asia R Thompson 11/09/2013, 10:48 AM

## 2013-11-09 NOTE — Evaluation (Signed)
Physical Therapy Evaluation Patient Details Name: Joan Vargas MRN: [id number] DOB: 02-Aug-1955 Today's Date: 11/09/2013   History of Present Illness  Pt is a 58 year old female s/p R TKR.  Clinical Impression  Pt is s/p R TKA resulting in the deficits listed below (see PT Problem List).  Pt will benefit from skilled PT to increase their independence and safety with mobility to allow discharge to the venue listed below.  Pt able to ambulate around room POD 0 and plans to d/c home with spouse to assist.     Follow Up Recommendations Home health PT    Equipment Recommendations  Rolling walker with 5" wheels (attempting to acquire RW from friends)    Recommendations for Other Services       Precautions / Restrictions Precautions Precautions: Knee Required Braces or Orthoses: Knee Immobilizer - Right Restrictions Other Position/Activity Restrictions: WBAT      Mobility  Bed Mobility Overal bed mobility: Needs Assistance Bed Mobility: Supine to Sit;Sit to Supine     Supine to sit: Min guard;HOB elevated Sit to supine: Min assist   General bed mobility comments: verbal cues for technique, assist for R LE onto bed  Transfers Overall transfer level: Needs assistance Equipment used: Rolling walker (2 wheeled) Transfers: Sit to/from Stand Sit to Stand: Min assist         General transfer comment: verbal cues for safe technique including UE and LE placement  Ambulation/Gait Ambulation/Gait assistance: Min guard Ambulation Distance (Feet): 18 Feet Assistive device: Rolling walker (2 wheeled) Gait Pattern/deviations: Step-to pattern;Antalgic;Decreased stance time - right     General Gait Details: verbal cues for sequence, safety, RW distance, WBing through UEs to assist with R LE pain  Stairs            Wheelchair Mobility    Modified Rankin (Stroke Patients Only)       Balance                                             Pertinent  Vitals/Pain R knee pain feeling better after being in CPM, increased pain during ambulation with activity to tolerance however RN in to bring pain meds end of session, ice pack applied.    Home Living Family/patient expects to be discharged to:: Private residence Living Arrangements: Spouse/significant other   Type of Home: House Home Access: Stairs to enter Entrance Stairs-Rails: Can reach both;Left;Right Entrance Stairs-Number of Steps: 2 Home Layout: One level Home Equipment: [place] - 4 wheels;Other (comment);Crutches (rollator) Additional Comments: attempting to borrow RW from friends    Prior Function Level of Independence: Independent               Hand Dominance        Extremity/Trunk Assessment               Lower Extremity Assessment: RLE deficits/detail RLE Deficits / Details: unable to perform SLR, fair quad contraction, knee flexion not assessed today due to pain, maintained KI       Communication   Communication: No difficulties  Cognition Arousal/Alertness: Awake/alert Behavior During Therapy: WFL for tasks assessed/performed Overall Cognitive Status: Within Functional Limits for tasks assessed                      General Comments      Exercises  Assessment/Plan    PT Assessment Patient needs continued PT services  PT Diagnosis Acute pain;Difficulty walking   PT Problem List Decreased strength;Decreased range of motion;Decreased mobility;Decreased knowledge of precautions;Decreased knowledge of use of DME;Pain  PT Treatment Interventions Functional mobility training;Stair training;Gait training;DME instruction;Patient/family education;Therapeutic exercise;Therapeutic activities   PT Goals (Current goals can be found in the Care Plan section) Acute Rehab PT Goals PT Goal Formulation: With patient Time For Goal Achievement: 11/14/13 Potential to Achieve Goals: Good    Frequency 7X/week   Barriers to discharge         Co-evaluation               End of Session Equipment Utilized During Treatment: Right knee immobilizer Activity Tolerance: Patient limited by pain Patient left: in bed;with call bell/phone within reach;with family/visitor present;with nursing/sitter in room;with bed alarm set           Time: 3220-2542 PT Time Calculation (min): 21 min   Charges:   PT Evaluation $Initial PT Evaluation Tier I: 1 Procedure PT Treatments $Gait Training: 8-22 mins   PT G CodesJunius Argyle 11/09/2013, 2:44 PM Carmelia Bake, PT, DPT 11/09/2013 Pager: [phone]

## 2013-11-09 NOTE — Transfer of Care (Signed)
Immediate Anesthesia Transfer of Care Note  Patient: Joan Vargas  Procedure(s) Performed: Procedure(s): RIGHT TOTAL KNEE ARTHROPLASTY (Right)  Patient Location: PACU  Anesthesia Type:General  Level of Consciousness: awake, alert  and oriented  Airway & Oxygen Therapy: Patient Spontanous Breathing and Patient connected to nasal cannula oxygen  Post-op Assessment: Report given to PACU RN, Post -op Vital signs reviewed and stable and Patient moving all extremities X 4  Post vital signs: Reviewed and stable  Complications: No apparent anesthesia complications

## 2013-11-09 NOTE — Anesthesia Preprocedure Evaluation (Addendum)
Anesthesia Evaluation  Patient identified by MRN, date of birth, ID band Patient awake    Reviewed: Allergy & Precautions, H&P , NPO status , Patient's Chart, lab work & pertinent test results  History of Anesthesia Complications (+) PONV  Airway Mallampati: I TM Distance: >3 FB Neck ROM: Full    Dental  (+) Dental Advisory Given, Edentulous Upper, Edentulous Lower   Pulmonary former smoker,  breath sounds clear to auscultation        Cardiovascular hypertension, Pt. on medications Rhythm:Regular Rate:Normal     Neuro/Psych    GI/Hepatic GERD-  Medicated and Controlled,  Endo/Other  diabetes, Well Controlled, Type 2, Oral Hypoglycemic AgentsMorbid obesity  Renal/GU      Musculoskeletal   Abdominal   Peds  Hematology   Anesthesia Other Findings   Reproductive/Obstetrics                          Anesthesia Physical Anesthesia Plan  ASA: III  Anesthesia Plan: General   Post-op Pain Management:    Induction: Intravenous  Airway Management Planned: Oral ETT  Additional Equipment:   Intra-op Plan:   Post-operative Plan: Extubation in OR  Informed Consent: I have reviewed the patients History and Physical, chart, labs and discussed the procedure including the risks, benefits and alternatives for the proposed anesthesia with the patient or authorized representative who has indicated his/her understanding and acceptance.   Dental advisory given  Plan Discussed with: CRNA, Anesthesiologist and Surgeon  Anesthesia Plan Comments:         Anesthesia Quick Evaluation

## 2013-11-09 NOTE — Brief Op Note (Signed)
11/09/2013  9:33 AM  PATIENT:  Joan Vargas  58 y.o. female  PRE-OPERATIVE DIAGNOSIS:  RIGHT KNEE OA, end stage  POST-OPERATIVE DIAGNOSIS:  RIGHT KNEE OA, end stage  PROCEDURE:  Procedure(s): RIGHT TOTAL KNEE ARTHROPLASTY (Right), [employer] Sigma RP  SURGEON:  Surgeon(s) and Role:    * Augustin Schooling, MD - Primary  PHYSICIAN ASSISTANT:   ASSISTANTS: Ventura Bruns, PA-C   ANESTHESIA:   regional and general  EBL:  Total I/O In: -  Out: 300 [Urine:300]  BLOOD ADMINISTERED:none  DRAINS: none   LOCAL MEDICATIONS USED:  none  SPECIMEN:  No Specimen  DISPOSITION OF SPECIMEN:  N/A  COUNTS:  YES  TOURNIQUET:   Total Tourniquet Time Documented: Thigh (Right) - 95 minutes Total: Thigh (Right) - 95 minutes   DICTATION: .Other Dictation: Dictation Number [id number]  PLAN OF CARE: Admit to inpatient   PATIENT DISPOSITION:  PACU - hemodynamically stable.   Delay start of Pharmacological VTE agent (>24hrs) due to surgical blood loss or risk of bleeding: no

## 2013-11-10 LAB — CBC
HCT: 30.8 % — ABNORMAL LOW (ref 36.0–46.0)
HEMOGLOBIN: 10.4 g/dL — AB (ref 12.0–15.0)
MCH: 28.9 pg (ref 26.0–34.0)
MCHC: 33.8 g/dL (ref 30.0–36.0)
MCV: 85.6 fL (ref 78.0–100.0)
Platelets: 197 10*3/uL (ref 150–400)
RBC: 3.6 MIL/uL — ABNORMAL LOW (ref 3.87–5.11)
RDW: 14.4 % (ref 11.5–15.5)
WBC: 8.8 10*3/uL (ref 4.0–10.5)

## 2013-11-10 LAB — PROTIME-INR
INR: 1.08 (ref 0.00–1.49)
Prothrombin Time: 13.8 seconds (ref 11.6–15.2)

## 2013-11-10 LAB — GLUCOSE, CAPILLARY
GLUCOSE-CAPILLARY: 202 mg/dL — AB (ref 70–99)
GLUCOSE-CAPILLARY: 170 mg/dL — AB (ref 70–99)
Glucose-Capillary: 192 mg/dL — ABNORMAL HIGH (ref 70–99)
Glucose-Capillary: 161 mg/dL — ABNORMAL HIGH (ref 70–99)

## 2013-11-10 LAB — BASIC METABOLIC PANEL
BUN: 11 mg/dL (ref 6–23)
CHLORIDE: 99 meq/L (ref 96–112)
CO2: 26 mEq/L (ref 19–32)
Calcium: 9.2 mg/dL (ref 8.4–10.5)
Creatinine, Ser: 0.8 mg/dL (ref 0.50–1.10)
GFR calc non Af Amer: 79 mL/min — ABNORMAL LOW (ref >90)
Glucose, Bld: 196 mg/dL — ABNORMAL HIGH (ref 70–99)
POTASSIUM: 4.3 meq/L (ref 3.7–5.3)
Sodium: 135 mEq/L — ABNORMAL LOW (ref 137–147)

## 2013-11-10 MED ORDER — WARFARIN SODIUM 7.5 MG PO TABS
7.5000 mg | ORAL_TABLET | Freq: Once | ORAL | Status: AC
  Administered 2013-11-10: 7.5 mg via ORAL
  Filled 2013-11-10: qty 1

## 2013-11-10 NOTE — Progress Notes (Signed)
Occupational Therapy Treatment Patient Details Name: Joan Vargas MRN: [id number] DOB: 06-Aug-1955 Today's Date: 11/10/2013    History of present illness Pt is a 58 year old female s/p R TKR.   OT comments  Pt seen today for functional mobility and completion of evaluation. Pt moving at min guard level for functional mobility. Pt has no ADL concerns and states that her family will assist her as needed. Acute OT to sign off at this time.   Follow Up Recommendations  No OT follow up;Supervision/Assistance - 24 hour    Equipment Recommendations  3 in 1 bedside comode (if cannot borrow from friend)       Precautions / Restrictions Precautions Precautions: Knee Precaution Comments: Educated pt on precautions Required Braces or Orthoses: Knee Immobilizer - Right Restrictions Weight Bearing Restrictions: No Other Position/Activity Restrictions: WBAT       Mobility Bed Mobility Overal bed mobility: Needs Assistance Bed Mobility: Supine to Sit;Sit to Supine     Supine to sit: Min guard;HOB elevated Sit to supine: Min assist (for RLE onto bed)     Transfers Overall transfer level: Needs assistance Equipment used: Rolling walker (2 wheeled) Transfers: Sit to/from Stand Sit to Stand: Min guard         General transfer comment: No verbal cues required.        ADL Overall ADL's : Needs assistance/impaired    Grooming: Min guard;Standing     Lower Body Bathing: Min guard;Sit to/from stand     Lower Body Dressing: Min guard;Sit to/from stand   Toilet Transfer: Min guard;RW   Toileting- Water quality scientist and Hygiene: Min guard;Sit to/from stand   Tub/ Banker: Walk-in shower;Tub transfer;Min guard;Ambulation;Rolling walker   Functional mobility during ADLs: Min guard;Rolling walker General ADL Comments: Pt moving well this afternoon and OT followed-up to address any questions or concerns with ADL. Pt reports that she has none and family will assist.           Perception Perception Perception Tested?: No   Praxis Praxis Praxis tested?: Within functional limits    Cognition  Arousal/Alertness: Awake/alert Behavior During Therapy: WFL for tasks assessed/performed Overall Cognitive Status: Within Functional Limits for tasks assessed                       Extremity/Trunk Assessment  Upper Extremity Assessment Upper Extremity Assessment: Overall WFL for tasks assessed   Lower Extremity Assessment Lower Extremity Assessment: Defer to PT evaluation                   Pertinent Vitals/ Pain       Pt c/o mild pain in R knee. Repositioned in bed and applied ice pack.  Home Living Family/patient expects to be discharged to:: Private residence Living Arrangements: Spouse/significant other Available Help at Discharge: Family;Available 24 hours/day Type of Home: House Home Access: Stairs to enter [employer] of Steps: 2 Entrance Stairs-Rails: Can reach both;Left;Right Home Layout: One level     Bathroom Shower/Tub: Tub/shower unit;Walk-in shower Shower/tub characteristics: Curtain Biochemist, clinical: Handicapped height     Home Equipment: Environmental consultant - 4 wheels;Crutches;Other (comment) ((rollator); may have access to [facility])   Additional Comments: attempting to borrow RW and BSC from friend      Prior Functioning/Environment Level of Independence: Independent            Frequency Min 2X/week     Progress Toward Goals  OT Goals(current goals can now be found in the care plan  section)  Progress towards OT goals: Goals met/education completed, patient discharged from [facility] Discharge plan remains appropriate;Other (comment) (pt has no further ADL concerns and adequate for discharge fr)       End of Session Equipment Utilized During Treatment: Gait belt;Rolling walker;Right knee immobilizer   Activity Tolerance Patient tolerated treatment well   Patient Left in bed;with call bell/phone within  reach;with family/visitor present           Time: 0998-3382 OT Time Calculation (min): 10 min  Charges: OT General Charges $OT Visit: 1 Procedure OT Evaluation $Initial OT Evaluation Tier I: 1 Procedure OT Treatments $Self Care/Home Management : 8-22 mins  Juluis Rainier [phone] 11/10/2013, 4:51 PM

## 2013-11-10 NOTE — Progress Notes (Signed)
   Subjective: 1 Day Post-Op Procedure(s) (LRB): RIGHT TOTAL KNEE ARTHROPLASTY (Right)   Patient reports pain as moderate, pain controlled. No events throughout the night. Denies any CP or SOB. Ready to get up and test the knee with some PT.  Objective:   VITALS:   Filed Vitals:   11/10/13 0552  BP: 113/62  Pulse: 95  Temp: 99.8 F (37.7 C)  Resp: 16    Neurovascular intact Dorsiflexion/Plantar flexion intact Incision: dressing C/D/I No cellulitis present Compartment soft  LABS  Recent Labs  11/10/13 0545  HGB 10.4*  HCT 30.8*  WBC 8.8  PLT 197     Recent Labs  11/10/13 0545  NA 135*  K 4.3  BUN 11  CREATININE 0.80  GLUCOSE 196*     Assessment/Plan: 1 Day Post-Op Procedure(s) (LRB): RIGHT TOTAL KNEE ARTHROPLASTY (Right) Foley cath d/c'ed Coumadin for anticoagulation Advance diet Up with therapy, WBAT right leg. D/C IV fluids     [place]. Hernandez [place]   PAC  11/10/2013, 8:22 AM

## 2013-11-10 NOTE — Progress Notes (Signed)
Physical Therapy Treatment Patient Details Name: Joan Vargas MRN: [id number] DOB: 1955/11/11 Today's Date: 11/10/2013    History of Present Illness Pt is a 58 year old female s/p R TKR.    PT Comments    Patient is progressing very well with mobility. Patient is planning to possibly DC home tomorrow. Does have 2 steps to enter her house that she will have to practice. Continue with current POC  Follow Up Recommendations  Home health PT     Equipment Recommendations  Rolling walker with 5" wheels    Recommendations for Other Services       Precautions / Restrictions Precautions Precautions: Knee Required Braces or Orthoses: Knee Immobilizer - Right Restrictions Other Position/Activity Restrictions: WBAT    Mobility  Bed Mobility               General bed mobility comments: Patient up in recliner upon entering room  Transfers Overall transfer level: Needs assistance Equipment used: Rolling walker (2 wheeled)   Sit to Stand: Min guard         General transfer comment: Patient with good technique  Ambulation/Gait Ambulation/Gait assistance: Min guard Ambulation Distance (Feet): 90 Feet Assistive device: Rolling walker (2 wheeled) Gait Pattern/deviations: Step-to pattern;Decreased step length - right;Decreased step length - left     General Gait Details: Patient cued to increase step length and for positioning of RW   Stairs            Wheelchair Mobility    Modified Rankin (Stroke Patients Only)       Balance                                    Cognition Arousal/Alertness: Awake/alert Behavior During Therapy: WFL for tasks assessed/performed Overall Cognitive Status: Within Functional Limits for tasks assessed                      Exercises Total Joint Exercises Quad Sets: AROM;Right;10 reps Heel Slides: AROM;Right;10 reps Hip ABduction/ADduction: AROM;Right;10 reps Straight Leg Raises: AROM;Right;10  reps Goniometric ROM: ~50 degrees AROM in long sitting    General Comments        Pertinent Vitals/Pain no apparent distress     Home Living                      Prior Function            PT Goals (current goals can now be found in the care plan section) Progress towards PT goals: Progressing toward goals    Frequency  7X/week    PT Plan Current plan remains appropriate    Co-evaluation             End of Session Equipment Utilized During Treatment: Right knee immobilizer Activity Tolerance: Patient tolerated treatment well Patient left: in chair;with call bell/phone within reach;with family/visitor present     Time: 1015-1040 PT Time Calculation (min): 25 min  Charges:  $Gait Training: 8-22 mins $Therapeutic Exercise: 8-22 mins                    G Codes:      Tonia Brooms Robin[place] 11/10/2013, 10:52 AM  11/10/2013 [place] PTA [phone] pager [phone] office

## 2013-11-10 NOTE — Progress Notes (Signed)
PT Cancellation Note  Patient Details Name: Joan Vargas MRN: [id number] DOB: 08-Aug-1955   Cancelled Treatment:    Reason Eval/Treat Not Completed: Fatigue/lethargy limiting ability to participate. Patient politely declined second round of therapy. Had gotten back to bed with assist of husband. Will see patient again in AM   [place] 11/10/2013, 2:54 PM

## 2013-11-10 NOTE — Progress Notes (Signed)
ANTICOAGULATION CONSULT NOTE - Follow Up Consult  Pharmacy Consult for Coumadin Indication: VTE prophylaxis  Allergies  Allergen Reactions  . Aspirin Nausea And Vomiting    REACTION: stomach upset    Patient Measurements: Height: 5' 1.5" (156.2 cm) Weight: 208 lb 0.6 oz (94.365 kg) IBW/kg (Calculated) : 48.95  Vital Signs: Temp: 101.3 F (38.5 C) (05/30 1327) Temp src: Oral (05/30 1327) BP: 96/55 mmHg (05/30 1327) Pulse Rate: 89 (05/30 1327)  Labs:  Recent Labs  11/10/13 0545  HGB 10.4*  HCT 30.8*  PLT 197  LABPROT 13.8  INR 1.08  CREATININE 0.80    Estimated Creatinine Clearance: 79.3 ml/min (by C-G formula based on Cr of 0.8).   Medications:  Scheduled:  . aspirin EC  81 mg Oral QHS  . Canagliflozin  100 mg Oral QAC breakfast  . diclofenac sodium  1 application Topical BID  . estradiol  1 Applicatorful Vaginal Q Sat  . fluticasone  1 spray Each Nare QHS  . losartan  100 mg Oral Daily   And  . hydrochlorothiazide  25 mg Oral Daily  . insulin aspart  0-20 Units Subcutaneous TID WC  . insulin aspart  0-5 Units Subcutaneous QHS  . insulin aspart  6 Units Subcutaneous TID WC  . levothyroxine  175 mcg Oral QAC breakfast  . loratadine  10 mg Oral Daily  . metFORMIN  1,000 mg Oral BID WC  . metoprolol succinate  50 mg Oral q morning - 10a  . montelukast  10 mg Oral q morning - 10a  . multivitamin with minerals  1 tablet Oral Daily  . omega-3 acid ethyl esters  2 g Oral BID  . pantoprazole  80 mg Oral Daily  . pramipexole  0.25 mg Oral QHS  . pregabalin  150 mg Oral BID  . warfarin   Does not apply Once  . Warfarin - Pharmacist Dosing Inpatient   Does not apply q1800    Assessment: 58 yo F s/p R TKA on Coumadin for post-op VTE prophylaxis.  INR essentially at baseline after 1 Coumadin dose.  No bleeding noted.  Goal of Therapy:  INR 2-3 Monitor platelets by anticoagulation protocol: Yes   Plan:  Repeat Coumadin 7.5 mg PO x 1 tonight. Daily  INR.  [employer], Pharm.D., BCPS Clinical Pharmacist Pager [phone] 11/10/2013 2:54 PM

## 2013-11-10 NOTE — Op Note (Signed)
NAMEMarland Vargas  PEACHES, VANOVERBEKE                  ACCOUNT NO.:  [id number]  MEDICAL RECORD NO.:  [id number]  LOCATION:  5N30C                        FACILITY:  [facility]  PHYSICIAN:  Doran Heater. Joan Vargas, M.D. DATE OF BIRTH:  [birth year]  DATE OF PROCEDURE:  11/09/2013 DATE OF DISCHARGE:                              OPERATIVE REPORT   PREOPERATIVE DIAGNOSIS:  Right knee end-stage osteoarthritis.  POSTOPERATIVE DIAGNOSIS:  Right knee end-stage osteoarthritis.  PROCEDURE PERFORMED:  Right total knee replacement using DePuy Sigma rotating platform prosthesis.  ATTENDING SURGEON:  Doran Heater. Joan Vargas, M.D.  ASSISTANT:  Abbott Pao. Dixon, PA-C, who scrubbed the entire procedure and necessary for satisfactory completion of surgery.  ANESTHESIA:  General anesthesia was used plus femoral block.  ESTIMATED BLOOD LOSS:  Minimal.  FLUID REPLACEMENT:  1000 mL crystalloid.  INSTRUMENT COUNTS:  Correct.  COMPLICATIONS:  There were no complications.  ANTIBIOTICS:  Perioperative antibiotics were given.  INDICATIONS:  Patient is a 58 year old female with worsening right knee pain secondary to end-stage arthritis.  The patient has had progressive pain despite conservative management and desires operative treatment to restore knee range of motion and function.  The patient is unable to walk more than a block secondary to her arthritis, has bone-on-bone on x- ray and standing films and uses an orthopedic device, a cane, to ambulate for balance.  She has had an exhaustive treatment with non- operative treatment, and now presents for surgery.  Informed consent was obtained.  DESCRIPTION OF PROCEDURE:  After an adequate level of anesthesia was achieved, the patient was positioned supine on the operating room table. The right leg was correctly identified.  Nonsterile tourniquet was placed on right proximal thigh.  Right leg sterilely prepped and draped in usual manner.  Time-out called.  We entered the knee using  standard midline incision.  Once we had elevated the limb and exsanguinated using Esmarch bandage, we placed the knee in flexion and made a midline incision with a 10 blade.  Dissection down through subcutaneous tissues. The median parapatellar arthrotomy was created with a fresh 10 blade scalpel.  Lateral patellofemoral ligaments divided, patella everted, distal femur entered with a step-cut drill.  We placed an intramedullary distal femoral resection guide set on 5 degrees right, and we resected 10 mm of bone off the distal femur.  We went ahead and sized the femur to 3 anterior down and performed our anterior, posterior, and chamfer cuts with a 4-in-1 block.  Next, we resected ACL, PCL, and remaining meniscal tissue.  We then used an external alignment jig for the tibia and cut the tibia 90 degrees perpendicular to long axis of the tibia with minimal posterior slope with taking 2 mm off the affected medial side.  We next resected extra posterior bone off the posterior femur and the posterior capsule, placed our extension, flexion blocks to check our gaps which were symmetric at 12.5 mm.  Next, we went ahead and removed our pins from the tibia.  We did our final tibial preparation with the modular drilling keel punch and then cut the box cut for the posterior cruciate substituting femoral component using the box resection guide. Next, we placed our  trial components in place, with 12.5 poly insert and extended the knee fully.  We had nice flexion and extension stability. We then went ahead and resurfaced our patella starting with 24 mm thickness down to a 16 mm thickness and then drilled for the 35 patella. We ranged the knee with the no finger technique and had nice patellar tracking.  We removed all trial components, pulse irrigated the bone and cemented the components into place.  Size 3 tibia, size 3 right femur, and 12.5 trial poly and then a 35 patellar button.  We waited till  the cement was hardened, removed excess cement using quarter-inch curved osteotome, and then trialed again with a 12.5 and felt like that was the appropriate thickness giving [place] good stability in flexion and extension. We were able to achieve full extension.  We removed the trial poly, inspected one final time for extra cement, irrigated the knee thoroughly and then placed our 12.5 real polyethylene insert and the knee snapped closed on the medial side.  We pleased with that tightness there and then went ahead and irrigated again and closed median parapatellar arthrotomy with interrupted #1 Vicryl suture followed by 2-0 Vicryl subcutaneous closure and 4-0 Monocryl for skin.  Steri-Strips applied followed by sterile dressing.  The patient tolerated the procedure well.     Doran Heater. Joan Vargas, M.D.     SRN/MEDQ  D:  11/09/2013  T:  11/10/2013  Job:  [id number]

## 2013-11-10 NOTE — Progress Notes (Addendum)
Occupational Therapy Evaluation Patient Details Name: Joan Vargas MRN: [id number] DOB: 10-29-1955 Today's Date: 11/10/2013    History of Present Illness Pt is a 58 year old female s/p R TKR.   Clinical Impression   PTA pt lived at home and was independent with ADLs and functional mobility. Pt recently moved back to bed and reports feeling fatigued. Brief assessment completed for ADL performance. Per PT note, pt at [facility] level for transfers and functional ambulation. Will follow-up with pt to assess functional mobility as appropriate. Pt will benefit from continued skilled OT to progress to mod Independent level prior to d/c home.     Follow Up Recommendations  No OT follow up;Supervision/Assistance - 24 hour    Equipment Recommendations  3 in 1 bedside comode (if cannot borrow from Friend)       Precautions / Restrictions Precautions Precautions: Knee Precaution Comments: Educated pt on precautions Required Braces or Orthoses: Knee Immobilizer - Right Restrictions Weight Bearing Restrictions: No Other Position/Activity Restrictions: WBAT      Mobility Bed Mobility               General bed mobility comments: Pt in bed when OT arrived, declined OOB activities. Per PT note, pt at min guard level for sit<>stand transfers and functional mobility.   Transfers Overall transfer level: Needs assistance Equipment used: Rolling walker (2 wheeled)   Sit to Stand: Min guard         General transfer comment: Patient with good technique         ADL Overall ADL's : Needs assistance/impaired Eating/Feeding: Independent;Sitting   Grooming: Min guard;Standing   Upper Body Bathing: Set up;Sitting     Upper Body Dressing : Set up;Sitting       General ADL Comments: Pt declined OOB activities this date due to fatigue. Brief assessment completed at bed level and OT will follow-up to complete evaluation. Pt has no ADL concerns. She reports family will assist 24/7 with  ADLs. Educated pt on donning/doffing socks and shoes to facilitate knee flexion. Educated pt and husband on home safety with use of RW and DME. Pt plans to get RW and BSC from friend. Discussed use of BSC in the shower and demonstrated tub/shower transfer. Pt has no further ADL concerns.       Vision  Per pt report, no change from baseline.                   Perception Perception Perception Tested?: No   Praxis Praxis Praxis tested?: Within functional limits    Pertinent Vitals/Pain Pt reports soreness and fatigue. NAD.     Hand Dominance Right   Extremity/Trunk Assessment Upper Extremity Assessment Upper Extremity Assessment: Overall WFL for tasks assessed   Lower Extremity Assessment Lower Extremity Assessment: Defer to PT evaluation       Communication Communication Communication: No difficulties   Cognition Arousal/Alertness: Awake/alert Behavior During Therapy: WFL for tasks assessed/performed Overall Cognitive Status: Within Functional Limits for tasks assessed                                Home Living Family/patient expects to be discharged to:: Private residence Living Arrangements: Spouse/significant other Available Help at Discharge: Family;Available 24 hours/day Type of Home: House Home Access: Stairs to enter [employer] of Steps: 2 Entrance Stairs-Rails: Can reach both;Left;Right Home Layout: One level     Bathroom Shower/Tub: Tub/shower unit;Walk-in shower  Shower/tub characteristics: Curtain Biochemist, clinical: Handicapped height     Home Equipment: Environmental consultant - 4 wheels;Crutches;Other (comment) ((rollator); may have access to [facility])   Additional Comments: attempting to borrow RW and BSC from friend      Prior Functioning/Environment Level of Independence: Independent             OT Diagnosis: Generalized weakness;Acute pain   OT Problem List: Decreased strength;Decreased range of motion;Decreased activity  tolerance;Impaired balance (sitting and/or standing);Decreased safety awareness;Decreased knowledge of use of DME or AE;Decreased knowledge of precautions;Pain   OT Treatment/Interventions: Self-care/ADL training;Therapeutic exercise;Energy conservation;DME and/or AE instruction;Therapeutic activities;Patient/family education;Balance training    OT Goals(Current goals can be found in the care plan section) Acute Rehab OT Goals Patient Stated Goal: to go home OT Goal Formulation: With patient Time For Goal Achievement: 11/17/13 Potential to Achieve Goals: Good ADL Goals Pt Will Perform Grooming: with modified independence;standing Pt Will Perform Lower Body Bathing: with modified independence;sit to/from stand Pt Will Perform Lower Body Dressing: with modified independence;sit to/from stand Pt Will Transfer to Toilet: with modified independence;ambulating;bedside commode Pt Will Perform Tub/Shower Transfer: Tub transfer;Shower transfer;with modified independence;ambulating;3 in 1;rolling walker Additional ADL Goal #1: Pt will perform bed mobility with Mod Independence with use of leg lifter to prepare for ADLs.  OT Frequency: Min 2X/week    End of Session  Activity Tolerance: Patient limited by fatigue Patient left: in bed;with call bell/phone within reach;with family/visitor present   Time: 8182-9937 OT Time Calculation (min): 19 min Charges:  OT General Charges $OT Visit: 1 Procedure OT Evaluation $Initial OT Evaluation Tier I: 1 Procedure  Juluis Rainier [phone] 11/10/2013, 2:13 PM

## 2013-11-10 NOTE — Plan of Care (Signed)
Problem: Consults Goal: Diagnosis- Total Joint Replacement Outcome: Completed/Met Date Met:  11/10/13 Primary Total Knee

## 2013-11-11 LAB — CBC
HEMATOCRIT: 30.5 % — AB (ref 36.0–46.0)
HEMOGLOBIN: 10.4 g/dL — AB (ref 12.0–15.0)
MCH: 28.6 pg (ref 26.0–34.0)
MCHC: 34.1 g/dL (ref 30.0–36.0)
MCV: 83.8 fL (ref 78.0–100.0)
Platelets: 180 10*3/uL (ref 150–400)
RBC: 3.64 MIL/uL — ABNORMAL LOW (ref 3.87–5.11)
RDW: 14.2 % (ref 11.5–15.5)
WBC: 9.7 10*3/uL (ref 4.0–10.5)

## 2013-11-11 LAB — GLUCOSE, CAPILLARY
GLUCOSE-CAPILLARY: 157 mg/dL — AB (ref 70–99)
GLUCOSE-CAPILLARY: 151 mg/dL — AB (ref 70–99)

## 2013-11-11 LAB — PROTIME-INR
INR: 1.92 — ABNORMAL HIGH (ref 0.00–1.49)
PROTHROMBIN TIME: 21.4 s — AB (ref 11.6–15.2)

## 2013-11-11 NOTE — Care Management Note (Signed)
    Page 1 of 2   11/11/2013     3:00:09 PM CARE MANAGEMENT NOTE 11/11/2013  Patient:  MEEKA, CARTELLI   Account Number:  [id number]  Date Initiated:  11/11/2013  Documentation initiated by:  [facility]  Subjective/Objective Assessment:   adm: right knee pain; RIGHT TOTAL KNEE ARTHROPLASTY on 11/09/2013     Action/Plan:   discharge planning   Anticipated DC Date:  11/11/2013   Anticipated DC Plan:  [place]  CM consult      [facility] Choice  HOME HEALTH   Choice offered to / List presented to:  C-1 Patient        [place] arranged  HH-1 RN  [place]   Status of service:  Completed, signed off Medicare Important Message given?  NO (If response is "NO", the following Medicare IM given date fields will be blank) Date Medicare IM given:   Date Additional Medicare IM given:    Discharge Disposition:  [place]  Per UR Regulation:    If discussed at Long Length of Stay Meetings, dates discussed:    Comments:  11/11/13 12:30 CM met with pt in room to offer choice.  Pt has an elevated toilet seat at home and a rolling walker. Pt to have [place] render HHPT/OT/RN services.  Address and contact information verified with pt.  Referral called to [place], [place].  CPM ordered by MD  but CM unable to reach [facility] reps; APS states they do not provide CPM.  RN notified and called MD office on this pt and another CPM pt and was told to have the pt call the office in the am and the office would arrange the CPM.  CM called pt and notified them of the plan and the pt verbalized understanding she is to call Dr. Gilberto Better office in the am to have the CPM machine arranged.  No other CM needs were communicated.  Mariane Masters, BSN, CM [phone].

## 2013-11-11 NOTE — Discharge Summary (Signed)
Physician Discharge Summary   Patient ID: COLENE MINES MRN: [id number] DOB/AGE: July 10, 1955 58 y.o.  Admit date: 11/09/2013 Discharge date: 11/11/2013  Admission Diagnoses:  Active Problems:   Osteoarthrosis, unspecified whether generalized or localized, lower leg   Discharge Diagnoses:  Same   Surgeries: Procedure(s): RIGHT TOTAL KNEE ARTHROPLASTY on 11/09/2013   Consultants: PT, OT, D/C planning  Discharged Condition: Stable  Hospital Course: ANALLELI GIERKE is an 58 y.o. female who was admitted 11/09/2013 with a chief complaint of right knee pain, and found to have a diagnosis of right knee osteoarthritis, end stage.  They were brought to the operating room on 11/09/2013 and underwent the above named procedures.    The patient had an uncomplicated hospital course and was stable for discharge.  Recent vital signs:  Filed Vitals:   11/11/13 0800  BP:   Pulse:   Temp:   Resp: 16    Recent laboratory studies:  Results for orders placed during the hospital encounter of 11/09/13  GLUCOSE, CAPILLARY      Result Value Ref Range   Glucose-Capillary 159 (*) 70 - 99 mg/dL  GLUCOSE, CAPILLARY      Result Value Ref Range   Glucose-Capillary 237 (*) 70 - 99 mg/dL   Comment 1 Documented in Chart     Comment 2 Notify RN    GLUCOSE, CAPILLARY      Result Value Ref Range   Glucose-Capillary 221 (*) 70 - 99 mg/dL   Comment 1 Notify RN     Comment 2 Documented in Chart    GLUCOSE, CAPILLARY      Result Value Ref Range   Glucose-Capillary 165 (*) 70 - 99 mg/dL   Comment 1 Notify RN     Comment 2 Documented in Chart    PROTIME-INR      Result Value Ref Range   Prothrombin Time 13.8  11.6 - 15.2 seconds   INR 1.08  0.00 - 1.49  CBC      Result Value Ref Range   WBC 8.8  4.0 - 10.5 K/uL   RBC 3.60 (*) 3.87 - 5.11 MIL/uL   Hemoglobin 10.4 (*) 12.0 - 15.0 g/dL   HCT 30.8 (*) 36.0 - 46.0 %   MCV 85.6  78.0 - 100.0 fL   MCH 28.9  26.0 - 34.0 pg   MCHC 33.8  30.0 - 36.0 g/dL   RDW 14.4  11.5 - 15.5 %   Platelets 197  150 - 400 K/uL  BASIC METABOLIC PANEL      Result Value Ref Range   Sodium 135 (*) 137 - 147 mEq/L   Potassium 4.3  3.7 - 5.3 mEq/L   Chloride 99  96 - 112 mEq/L   CO2 26  19 - 32 mEq/L   Glucose, Bld 196 (*) 70 - 99 mg/dL   BUN 11  6 - 23 mg/dL   Creatinine, Ser 0.80  0.50 - 1.10 mg/dL   Calcium 9.2  8.4 - 10.5 mg/dL   GFR calc non Af Amer 79 (*) >90 mL/min   GFR calc Af Amer >90  >90 mL/min  GLUCOSE, CAPILLARY      Result Value Ref Range   Glucose-Capillary 164 (*) 70 - 99 mg/dL   Comment 1 Notify RN    GLUCOSE, CAPILLARY      Result Value Ref Range   Glucose-Capillary 202 (*) 70 - 99 mg/dL   Comment 1 Notify RN    GLUCOSE, CAPILLARY  Result Value Ref Range   Glucose-Capillary 170 (*) 70 - 99 mg/dL  GLUCOSE, CAPILLARY      Result Value Ref Range   Glucose-Capillary 192 (*) 70 - 99 mg/dL  PROTIME-INR      Result Value Ref Range   Prothrombin Time 21.4 (*) 11.6 - 15.2 seconds   INR 1.92 (*) 0.00 - 1.49  CBC      Result Value Ref Range   WBC 9.7  4.0 - 10.5 K/uL   RBC 3.64 (*) 3.87 - 5.11 MIL/uL   Hemoglobin 10.4 (*) 12.0 - 15.0 g/dL   HCT 30.5 (*) 36.0 - 46.0 %   MCV 83.8  78.0 - 100.0 fL   MCH 28.6  26.0 - 34.0 pg   MCHC 34.1  30.0 - 36.0 g/dL   RDW 14.2  11.5 - 15.5 %   Platelets 180  150 - 400 K/uL  GLUCOSE, CAPILLARY      Result Value Ref Range   Glucose-Capillary 161 (*) 70 - 99 mg/dL   Comment 1 Notify RN    GLUCOSE, CAPILLARY      Result Value Ref Range   Glucose-Capillary 157 (*) 70 - 99 mg/dL   Comment 1 Notify RN    ABO/RH      Result Value Ref Range   ABO/RH(D) O POS      Discharge Medications:     Medication List    STOP taking these medications       ibuprofen 200 MG tablet  Commonly known as:  ADVIL,MOTRIN      TAKE these medications       aspirin 81 MG tablet  Take 81 mg by mouth at bedtime.     BAYER BREEZE 2 TEST Disk  Generic drug:  Glucose Blood     ADVOCATE TEST VI      cetirizine 10 MG tablet  Commonly known as:  ZYRTEC  Take 10 mg by mouth at bedtime.     CINNAMON PO  Take 2,000 mg by mouth daily with supper.     diclofenac sodium 1 % Gel  Commonly known as:  VOLTAREN  Apply 1 application topically 2 (two) times daily. Pain for thumb and knees     estradiol 0.1 MG/GM vaginal cream  Commonly known as:  ESTRACE  Place vaginally every Saturday.     fluticasone 50 MCG/ACT nasal spray  Commonly known as:  FLONASE  Place 1 spray into both nostrils at bedtime.     Ginger Root 550 MG Caps  Take 550 mg by mouth 2 (two) times daily.     INVOKANA 100 MG Tabs  Generic drug:  Canagliflozin  Take 100 mg by mouth daily before breakfast.     Lancing Device Misc     levothyroxine 175 MCG tablet  Commonly known as:  SYNTHROID, LEVOTHROID  Take 175 mcg by mouth daily before breakfast.     losartan-hydrochlorothiazide 100-25 MG per tablet  Commonly known as:  HYZAAR  Take 1 tablet by mouth every morning.     metFORMIN 1000 MG tablet  Commonly known as:  GLUCOPHAGE  Take 1,000 mg by mouth 2 (two) times daily with a meal.     methocarbamol 500 MG tablet  Commonly known as:  ROBAXIN  Take 1 tablet (500 mg total) by mouth 3 (three) times daily as needed for muscle spasms.     metoprolol succinate 50 MG 24 hr tablet  Commonly known as:  TOPROL-XL  Take 50 mg by  mouth every morning. Take with or immediately following a meal.     montelukast 10 MG tablet  Commonly known as:  SINGULAIR  Take 10 mg by mouth every morning.     MOVE FREE PO  Take 1 capsule by mouth every morning.     multivitamin tablet  Take 1 tablet by mouth every morning.     omega-3 acid ethyl esters 1 G capsule  Commonly known as:  LOVAZA  Take 2 g by mouth 2 (two) times daily.     omeprazole 20 MG capsule  Commonly known as:  PRILOSEC  Take 20 mg by mouth every morning.     oxyCODONE-acetaminophen 5-325 MG per tablet  Commonly known as:  ROXICET  Take 1-2 tablets by  mouth every 4 (four) hours as needed for severe pain.     pramipexole 0.25 MG tablet  Commonly known as:  MIRAPEX  Take 0.25 mg by mouth at bedtime.     pregabalin 150 MG capsule  Commonly known as:  LYRICA  Take 150 mg by mouth 2 (two) times daily.     TGT LANCET ULTRA THIN 30G Misc     warfarin 5 MG tablet  Commonly known as:  COUMADIN  Take 1 tablet (5 mg total) by mouth daily.        Diagnostic Studies: Dg Chest 2 View  10/30/2013   CLINICAL DATA:  Preop for right knee replacement  EXAM: CHEST  2 VIEW  COMPARISON:  Chest x-ray of 11/03/2011  FINDINGS: No active infiltrate or effusion is seen. Mediastinal contours appear normal, and the heart is within normal limits in size. No acute bony abnormality is noted.  IMPRESSION: No active cardiopulmonary disease.   Electronically Signed   By: Ivar Drape M.D.   On: 10/30/2013 16:18   Dg Knee Right Port  11/09/2013   CLINICAL DATA:  Status post total knee arthroplasty  EXAM: PORTABLE RIGHT KNEE - 1-2 VIEW  COMPARISON:  MRI 09/14/2013  FINDINGS: Status post total knee arthroplasty. Expected anterior swelling and soft tissue gas. No acute hardware complication.  IMPRESSION: Expected appearance after right total knee arthroplasty.   Electronically Signed   By: Abigail Miyamoto M.D.   On: 11/09/2013 10:28    Disposition: 01-Home or Self Care        Follow-up Information   Follow up with Develle Sievers,STEVEN R, MD. Call in 2 weeks. [phone])    Specialty:  Orthopedic Surgery   Contact information:   [address] [place] [address] [phone]        Signed: Augustin Schooling 11/11/2013, 8:21 AM

## 2013-11-11 NOTE — Progress Notes (Signed)
Physical Therapy Treatment Patient Details Name: FAIGY STRETCH MRN: [id number] DOB: [birth year] Today's Date: 11/11/2013    History of Present Illness Pt is a 58 year old female s/p R TKR.    PT Comments    Pt tolerated treatment well and is progressing nicely towards physical therapy goals. She has safely completed stair training and is ambulating up to 100 feet at a supervision level with a rolling walker. Husband is very supportive and participated in therapy session today. Patient is adequate for discharge from PT standpoint. Patient will continue to benefit from skilled physical therapy services in home health setting to further improve independence with functional mobility.   Follow Up Recommendations  Home health PT     Equipment Recommendations  Rolling walker with 5" wheels    Recommendations for Other Services       Precautions / Restrictions Precautions Precautions: Knee Required Braces or Orthoses: Knee Immobilizer - Right Restrictions Weight Bearing Restrictions: Yes RLE Weight Bearing: Weight bearing as tolerated    Mobility  Bed Mobility Overal bed mobility: Modified Independent             General bed mobility comments: Mod I with HOB elevated, requires extra time. No physical assist needed  Transfers Overall transfer level: Needs assistance Equipment used: Rolling walker (2 wheeled) Transfers: Sit to/from Stand Sit to Stand: Supervision         General transfer comment: Supervision for safety. Cues for hand placement. Decreased control with descent to sit. perfromed from lowest bed setting, recliner x 2 and toilet.  Ambulation/Gait Ambulation/Gait assistance: Supervision Ambulation Distance (Feet): 100 Feet (second bout of 80 feet) Assistive device: Rolling walker (2 wheeled) Gait Pattern/deviations: Step-to pattern;Step-through pattern;Decreased step length - left;Decreased stance time - right;Antalgic Gait velocity: decreased   General Gait  Details: Verbal cues for right knee extension with quadriceps activation in right stance phase. Gait training focused on step-through gait technique and forward gaze. No instances of knee buckling showing good stability of right knee.   Stairs Stairs: Yes Stairs assistance: Supervision Stair Management: Two rails;Step to pattern;Forwards Number of Stairs: 2 General stair comments: Cues for sequencing and technique, demonstrated by physical therapist and had patient teach back. Demonstrates understanding and performed this task safely. Husband was present and observed. No further questions and pt states she feels confident in navigating steps  Wheelchair Mobility    Modified Rankin (Stroke Patients Only)       Balance                                    Cognition Arousal/Alertness: Awake/alert Behavior During Therapy: WFL for tasks assessed/performed Overall Cognitive Status: Within Functional Limits for tasks assessed                      Exercises      General Comments        Pertinent Vitals/Pain 5/10 pain - denies having nurse called for pain medicine Patient repositioned in chair for comfort.     Home Living                      Prior Function            PT Goals (current goals can now be found in the care plan section) Acute Rehab PT Goals PT Goal Formulation: With patient Time For Goal Achievement: 11/14/13 Potential to Achieve  Goals: Good Progress towards PT goals: Progressing toward goals    Frequency  7X/week    PT Plan Current plan remains appropriate    Co-evaluation             End of Session Equipment Utilized During Treatment: Right knee immobilizer;Gait belt Activity Tolerance: Patient tolerated treatment well Patient left: in chair;with call bell/phone within reach;with family/visitor present     Time: 7035-0093 PT Time Calculation (min): 24 min  Charges:  $Gait Training: 23-37 mins                     G Codes:      [place]ina, [place]  Ellouise Newer 11/11/2013, 10:41 AM

## 2013-11-11 NOTE — Progress Notes (Signed)
  Orthopedics Progress Note  Subjective: I am wanting to go home today  Objective:  Filed Vitals:   11/11/13 0800  BP:   Pulse:   Temp:   Resp: 16    General: Awake and alert  Musculoskeletal: right knee incision clean and dry and intact Neurovascularly intact  Lab Results  Component Value Date   WBC 9.7 11/11/2013   HGB 10.4* 11/11/2013   HCT 30.5* 11/11/2013   MCV 83.8 11/11/2013   PLT 180 11/11/2013       Component Value Date/Time   NA 135* 11/10/2013 0545   K 4.3 11/10/2013 0545   CL 99 11/10/2013 0545   CO2 26 11/10/2013 0545   GLUCOSE 196* 11/10/2013 0545   BUN 11 11/10/2013 0545   CREATININE 0.80 11/10/2013 0545   CALCIUM 9.2 11/10/2013 0545   GFRNONAA 79* 11/10/2013 0545   GFRAA >90 11/10/2013 0545    Lab Results  Component Value Date   INR 1.92* 11/11/2013   INR 1.08 11/10/2013   INR 0.93 10/30/2013    Assessment/Plan: POD #2 s/p Procedure(s): RIGHT TOTAL KNEE ARTHROPLASTY Doing extremely well today. INR therapeutic Plan D/C if cleared by PT, HH PT,OT, RN  Doran Heater. Veverly Fells, MD 11/11/2013 8:15 AM

## 2013-11-11 NOTE — Progress Notes (Signed)
Clinical Education officer, museum (CSW) received consult for SNF placement. Per chart patient is going home with home health. CSW left message for RN case manager making her aware of above. Please reconsult if further social work needs arise. CSW signing off.   Blima Rich, [place] [facility] CSW [phone]

## 2013-11-12 ENCOUNTER — Encounter (HOSPITAL_COMMUNITY): Payer: Self-pay | Admitting: Orthopedic Surgery

## 2013-12-07 NOTE — OR Nursing (Signed)
Addendum to scope page 

## 2013-12-28 ENCOUNTER — Encounter (HOSPITAL_COMMUNITY): Payer: Self-pay | Admitting: Orthopedic Surgery

## 2014-06-28 ENCOUNTER — Other Ambulatory Visit: Payer: Self-pay | Admitting: Family Medicine

## 2014-06-28 ENCOUNTER — Ambulatory Visit
Admission: RE | Admit: 2014-06-28 | Discharge: 2014-06-28 | Disposition: A | Discharge status: Home / Self Care | Payer: BLUE CROSS/BLUE SHIELD | Source: Ambulatory Visit | Attending: Family Medicine | Admitting: Family Medicine

## 2014-06-28 DIAGNOSIS — R109 Unspecified abdominal pain: Secondary | ICD-10-CM

## 2014-07-01 ENCOUNTER — Other Ambulatory Visit: Payer: Self-pay | Admitting: Family Medicine

## 2014-07-01 DIAGNOSIS — R1032 Left lower quadrant pain: Secondary | ICD-10-CM

## 2014-07-03 ENCOUNTER — Ambulatory Visit
Admission: RE | Admit: 2014-07-03 | Discharge: 2014-07-03 | Disposition: A | Discharge status: Home / Self Care | Payer: BLUE CROSS/BLUE SHIELD | Source: Ambulatory Visit | Attending: Family Medicine | Admitting: Family Medicine

## 2014-07-03 DIAGNOSIS — R1032 Left lower quadrant pain: Secondary | ICD-10-CM

## 2014-09-04 ENCOUNTER — Other Ambulatory Visit: Payer: Self-pay

## 2014-09-04 DIAGNOSIS — Z1231 Encounter for screening mammogram for malignant neoplasm of breast: Secondary | ICD-10-CM

## 2014-09-09 ENCOUNTER — Ambulatory Visit
Admission: RE | Admit: 2014-09-09 | Discharge: 2014-09-09 | Disposition: A | Discharge status: Home / Self Care | Payer: BLUE CROSS/BLUE SHIELD | Source: Ambulatory Visit

## 2014-09-09 DIAGNOSIS — Z1231 Encounter for screening mammogram for malignant neoplasm of breast: Secondary | ICD-10-CM

## 2014-11-22 ENCOUNTER — Emergency Department (HOSPITAL_COMMUNITY): Payer: BLUE CROSS/BLUE SHIELD

## 2014-11-22 ENCOUNTER — Encounter (HOSPITAL_COMMUNITY): Payer: Self-pay | Admitting: Emergency Medicine

## 2014-11-22 ENCOUNTER — Emergency Department (HOSPITAL_COMMUNITY)
Admission: EM | Admit: 2014-11-22 | Discharge: 2014-11-22 | Disposition: A | Discharge status: Home / Self Care | Payer: BLUE CROSS/BLUE SHIELD | Attending: Emergency Medicine | Admitting: Emergency Medicine

## 2014-11-22 DIAGNOSIS — Z7951 Long term (current) use of inhaled steroids: Secondary | ICD-10-CM | POA: Diagnosis not present

## 2014-11-22 DIAGNOSIS — E119 Type 2 diabetes mellitus without complications: Secondary | ICD-10-CM | POA: Insufficient documentation

## 2014-11-22 DIAGNOSIS — Z87891 Personal history of nicotine dependence: Secondary | ICD-10-CM | POA: Insufficient documentation

## 2014-11-22 DIAGNOSIS — M797 Fibromyalgia: Secondary | ICD-10-CM | POA: Insufficient documentation

## 2014-11-22 DIAGNOSIS — N10 Acute tubulo-interstitial nephritis: Secondary | ICD-10-CM | POA: Diagnosis not present

## 2014-11-22 DIAGNOSIS — I10 Essential (primary) hypertension: Secondary | ICD-10-CM | POA: Diagnosis not present

## 2014-11-22 DIAGNOSIS — M1990 Unspecified osteoarthritis, unspecified site: Secondary | ICD-10-CM | POA: Diagnosis not present

## 2014-11-22 DIAGNOSIS — Z79899 Other long term (current) drug therapy: Secondary | ICD-10-CM | POA: Insufficient documentation

## 2014-11-22 DIAGNOSIS — Z7982 Long term (current) use of aspirin: Secondary | ICD-10-CM | POA: Insufficient documentation

## 2014-11-22 DIAGNOSIS — R1084 Generalized abdominal pain: Secondary | ICD-10-CM | POA: Diagnosis present

## 2014-11-22 DIAGNOSIS — F329 Major depressive disorder, single episode, unspecified: Secondary | ICD-10-CM | POA: Insufficient documentation

## 2014-11-22 DIAGNOSIS — Z862 Personal history of diseases of the blood and blood-forming organs and certain disorders involving the immune mechanism: Secondary | ICD-10-CM | POA: Insufficient documentation

## 2014-11-22 DIAGNOSIS — R1013 Epigastric pain: Secondary | ICD-10-CM

## 2014-11-22 LAB — COMPREHENSIVE METABOLIC PANEL
ALBUMIN: 3.9 g/dL (ref 3.5–5.0)
ALK PHOS: 76 U/L (ref 38–126)
ALT: 55 U/L — AB (ref 14–54)
ANION GAP: 14 (ref 5–15)
AST: 31 U/L (ref 15–41)
BUN: 17 mg/dL (ref 6–20)
CO2: 21 mmol/L — ABNORMAL LOW (ref 22–32)
Calcium: 9.6 mg/dL (ref 8.9–10.3)
Chloride: 100 mmol/L — ABNORMAL LOW (ref 101–111)
Creatinine, Ser: 0.88 mg/dL (ref 0.44–1.00)
GFR calc Af Amer: >60 mL/min (ref >60)
GFR calc non Af Amer: >60 mL/min (ref >60)
GLUCOSE: 178 mg/dL — AB (ref 65–99)
Potassium: 3.7 mmol/L (ref 3.5–5.1)
Sodium: 135 mmol/L (ref 135–145)
Total Bilirubin: 1.7 mg/dL — ABNORMAL HIGH (ref 0.3–1.2)
Total Protein: 7.7 g/dL (ref 6.5–8.1)

## 2014-11-22 LAB — URINALYSIS, ROUTINE W REFLEX MICROSCOPIC
Bilirubin Urine: NEGATIVE
Glucose, UA: >1000 mg/dL — AB
Hgb urine dipstick: NEGATIVE
Ketones, ur: 15 mg/dL — AB
NITRITE: POSITIVE — AB
Protein, ur: NEGATIVE mg/dL
SPECIFIC GRAVITY, URINE: 1.023 (ref 1.005–1.030)
Urobilinogen, UA: 0.2 mg/dL (ref 0.0–1.0)
pH: 5.5 (ref 5.0–8.0)

## 2014-11-22 LAB — CBC WITH DIFFERENTIAL/PLATELET
BASOS PCT: 0 % (ref 0–1)
Basophils Absolute: 0 10*3/uL (ref 0.0–0.1)
EOS ABS: 0 10*3/uL (ref 0.0–0.7)
Eosinophils Relative: 0 % (ref 0–5)
HCT: 39.1 % (ref 36.0–46.0)
HEMOGLOBIN: 13.8 g/dL (ref 12.0–15.0)
LYMPHS ABS: 1.4 10*3/uL (ref 0.7–4.0)
Lymphocytes Relative: 11 % — ABNORMAL LOW (ref 12–46)
MCH: 29.6 pg (ref 26.0–34.0)
MCHC: 35.3 g/dL (ref 30.0–36.0)
MCV: 83.9 fL (ref 78.0–100.0)
MONO ABS: 1.2 10*3/uL — AB (ref 0.1–1.0)
Monocytes Relative: 9 % (ref 3–12)
Neutro Abs: 10.4 10*3/uL — ABNORMAL HIGH (ref 1.7–7.7)
Neutrophils Relative %: 80 % — ABNORMAL HIGH (ref 43–77)
Platelets: 199 10*3/uL (ref 150–400)
RBC: 4.66 MIL/uL (ref 3.87–5.11)
RDW: 13.1 % (ref 11.5–15.5)
WBC: 13 10*3/uL — ABNORMAL HIGH (ref 4.0–10.5)

## 2014-11-22 LAB — URINE MICROSCOPIC-ADD ON

## 2014-11-22 LAB — LIPASE, BLOOD: LIPASE: 25 U/L (ref 22–51)

## 2014-11-22 MED ORDER — MORPHINE SULFATE 4 MG/ML IJ SOLN
4.0000 mg | Freq: Once | INTRAMUSCULAR | Status: AC
  Administered 2014-11-22: 4 mg via INTRAVENOUS
  Filled 2014-11-22: qty 1

## 2014-11-22 MED ORDER — IOHEXOL 300 MG/ML  SOLN
100.0000 mL | Freq: Once | INTRAMUSCULAR | Status: AC | PRN
Start: 2014-11-22 — End: 2014-11-22
  Administered 2014-11-22: 100 mL via INTRAVENOUS

## 2014-11-22 MED ORDER — CIPROFLOXACIN HCL 500 MG PO TABS: 500.0000 mg | ORAL_TABLET | Freq: Two times a day (BID) | ORAL | Status: DC

## 2014-11-22 MED ORDER — ONDANSETRON HCL 4 MG/2ML IJ SOLN
4.0000 mg | Freq: Once | INTRAMUSCULAR | Status: AC
  Administered 2014-11-22: 4 mg via INTRAVENOUS
  Filled 2014-11-22: qty 2

## 2014-11-22 MED ORDER — SODIUM CHLORIDE 0.9 % IV BOLUS (SEPSIS)
1000.0000 mL | Freq: Once | INTRAVENOUS | Status: AC
  Administered 2014-11-22: 1000 mL via INTRAVENOUS

## 2014-11-22 MED ORDER — ONDANSETRON 4 MG PO TBDP: ORAL_TABLET | ORAL | Status: DC

## 2014-11-22 MED ORDER — IOHEXOL 300 MG/ML  SOLN
25.0000 mL | Freq: Once | INTRAMUSCULAR | Status: AC | PRN
  Administered 2014-11-22: 25 mL via ORAL

## 2014-11-22 NOTE — ED Notes (Signed)
Pt returned back from CT, pt placed back on the monitor.

## 2014-11-22 NOTE — ED Provider Notes (Signed)
CSN: [id number]     Arrival date & time 11/22/14  8270 History   First MD Initiated Contact with Patient 11/22/14 [phone]     Chief Complaint  Patient presents with  . Abdominal Pain     (Consider location/radiation/quality/duration/timing/severity/associated sxs/prior Treatment) HPI Comments: 59 year old female with history of high blood pressure, depression, gastritis, urine infection, fibromyalgia presents with worsening diffuse abdominal pain worse epigastric and left side. Worsened since yesterday. This is different than previous pain, no focal radiation. Intermittent fevers, nausea without vomiting pain currently severe.  Patient is a 59 y.o. female presenting with abdominal pain. The history is provided by the patient.  Abdominal Pain Associated symptoms: fever   Associated symptoms: no chest pain, no chills, no dysuria, no shortness of breath and no vomiting     Past Medical History  Diagnosis Date  . Hypertension   . Depression   . Fibromyalgia   . Restless leg   . Diabetes mellitus     diet controlled not on meds   . Anemia   . Headache(784.0)   . Arthritis     knees and thumbs   . PONV (postoperative nausea and vomiting)    Past Surgical History  Procedure Laterality Date  . Appendectomy  1956  . Abdominal hysterectomy  1986  . Cesarean section  1984, 1986  . Knee arthroscopy  2000    right  . Oophorectomy    . Thyroidectomy  11/04/2011    Procedure: THYROIDECTOMY;  Surgeon: Earnstine Regal, MD;  Location: [facility];  Service: General;  Laterality: N/A;  Total Thyroidectomy  . Total knee arthroplasty Right 11/09/2013    Procedure: RIGHT TOTAL KNEE ARTHROPLASTY;  Surgeon: Augustin Schooling, MD;  Location: [facility];  Service: Orthopedics;  Laterality: Right;   Family History  Problem Relation Age of Onset  . Colon cancer Neg Hx   . Stomach cancer Neg Hx   . Diabetes Mother    History  Substance Use Topics  . Smoking status: Former Smoker -- 5 years    Quit date:  06/14/1981  . Smokeless tobacco: Never Used  . Alcohol Use: No   OB History    No data available     Review of Systems  Constitutional: Positive for fever and appetite change. Negative for chills.  HENT: Negative for congestion.   Eyes: Negative for visual disturbance.  Respiratory: Negative for shortness of breath.   Cardiovascular: Negative for chest pain.  Gastrointestinal: Positive for abdominal pain. Negative for vomiting.  Genitourinary: Positive for flank pain. Negative for dysuria.  Musculoskeletal: Negative for back pain, neck pain and neck stiffness.  Skin: Negative for rash.  Neurological: Negative for light-headedness and headaches.      Allergies  Aspirin  Home Medications   Prior to Admission medications   Medication Sig Start Date End Date Taking? Authorizing Provider  aspirin 81 MG tablet Take 81 mg by mouth at bedtime.    Yes Historical Provider, MD  BAYER BREEZE 2 TEST DISK  10/18/11  Yes Historical Provider, MD  Canagliflozin (INVOKANA) 100 MG TABS Take 100 mg by mouth daily before breakfast.   Yes Historical Provider, MD  cetirizine (ZYRTEC) 10 MG tablet Take 10 mg by mouth at bedtime.    Yes Historical Provider, MD  CINNAMON PO Take 2,000 mg by mouth daily with supper.    Yes Historical Provider, MD  DEXILANT 60 MG capsule Take 60 mg by mouth daily. 11/01/14  Yes Historical Provider, MD  diclofenac sodium (  VOLTAREN) 1 % GEL Apply 1 application topically 2 (two) times daily. Pain for thumb and knees   Yes Historical Provider, MD  estradiol (ESTRACE) 0.1 MG/GM vaginal cream Place vaginally every Saturday.    Yes Historical Provider, MD  fluticasone (FLONASE) 50 MCG/ACT nasal spray Place 1 spray into both nostrils at bedtime.    Yes Historical Provider, MD  Ginger, Zingiber officinalis, (GINGER ROOT) 550 MG CAPS Take 550 mg by mouth 2 (two) times daily.   Yes Historical Provider, MD  Glucosamine-Chondroitin (MOVE FREE PO) Take 1 capsule by mouth every morning.    Yes Historical Provider, MD  Glucose Blood (ADVOCATE TEST VI)  11/23/11  Yes Historical Provider, MD  Lancet Devices (LANCING DEVICE) [place]  11/23/11  Yes Historical Provider, MD  Lancets (TGT LANCET ULTRA THIN 30G) [place]  11/23/11  Yes Historical Provider, MD  levothyroxine (SYNTHROID, LEVOTHROID) 175 MCG tablet Take 175 mcg by mouth daily before breakfast.   Yes Historical Provider, MD  losartan-hydrochlorothiazide (HYZAAR) 100-25 MG per tablet Take 1 tablet by mouth every morning.    Yes Historical Provider, MD  metFORMIN (GLUCOPHAGE) 1000 MG tablet Take 1,000 mg by mouth 2 (two) times daily with a meal.   Yes Historical Provider, MD  methocarbamol (ROBAXIN) 500 MG tablet Take 1 tablet (500 mg total) by mouth 3 (three) times daily as needed for muscle spasms. 11/09/13  Yes Netta Cedars, MD  metoprolol succinate (TOPROL-XL) 50 MG 24 hr tablet Take 50 mg by mouth every morning. Take with or immediately following a meal.   Yes Historical Provider, MD  montelukast (SINGULAIR) 10 MG tablet Take 10 mg by mouth every morning.   Yes Historical Provider, MD  Multiple Vitamin (MULTIVITAMIN) tablet Take 1 tablet by mouth every morning.    Yes Historical Provider, MD  omega-3 acid ethyl esters (LOVAZA) 1 G capsule Take 2 g by mouth 2 (two) times daily.    Yes Historical Provider, MD  omeprazole (PRILOSEC) 20 MG capsule Take 20 mg by mouth every morning.   Yes Historical Provider, MD  oxyCODONE-acetaminophen (ROXICET) 5-325 MG per tablet Take 1-2 tablets by mouth every 4 (four) hours as needed for severe pain. 11/09/13  Yes Netta Cedars, MD  pramipexole (MIRAPEX) 0.25 MG tablet Take 0.25 mg by mouth at bedtime. 11/26/10  Yes Marletta Lor, MD  pregabalin (LYRICA) 150 MG capsule Take 150 mg by mouth at bedtime.    Yes Historical Provider, MD  PRISTIQ 50 MG 24 hr tablet Take 50 mg by mouth daily. 10/23/14  Yes Historical Provider, MD  ciprofloxacin (CIPRO) 500 MG tablet Take 1 tablet (500 mg total) by mouth 2  (two) times daily. One po bid x 10 days 11/22/14   Elnora Morrison, MD  ondansetron (ZOFRAN ODT) 4 MG disintegrating tablet 4mg  ODT q4 hours prn nausea/vomit 11/22/14   Elnora Morrison, MD   BP 107/69 mmHg  Pulse 85  Temp(Src) 98.2 F (36.8 C) (Oral)  Resp 25  Ht 5\' 1"  (1.549 m)  Wt 187 lb (84.823 kg)  BMI 35.35 kg/m2  SpO2 93% Physical Exam  Constitutional: She is oriented to person, place, and time. She appears well-developed and well-nourished.  HENT:  Head: Normocephalic and atraumatic.  Mild dry mucous membranes  Eyes: Conjunctivae are normal. Right eye exhibits no discharge. Left eye exhibits no discharge.  Neck: Normal range of motion. Neck supple. No tracheal deviation present.  Cardiovascular: Normal rate and regular rhythm.   Pulmonary/Chest: Effort normal and breath sounds normal.  Abdominal: Soft. She exhibits no distension. There is tenderness (diffuse mild tenderness worse epigastric and left mid abdomen no peritonitis). There is no guarding.  Musculoskeletal: She exhibits no edema.  Neurological: She is alert and oriented to person, place, and time.  Skin: Skin is warm. No rash noted.  Psychiatric: She has a normal mood and affect.  Nursing note and vitals reviewed.   ED Course  Procedures (including critical care time) Labs Review Labs Reviewed  COMPREHENSIVE METABOLIC PANEL - Abnormal; Notable for the following:    Chloride 100 (*)    CO2 21 (*)    Glucose, Bld 178 (*)    ALT 55 (*)    Total Bilirubin 1.7 (*)    All other components within normal limits  CBC WITH DIFFERENTIAL/PLATELET - Abnormal; Notable for the following:    WBC 13.0 (*)    Neutrophils Relative % 80 (*)    Neutro Abs 10.4 (*)    Lymphocytes Relative 11 (*)    Monocytes Absolute 1.2 (*)    All other components within normal limits  URINALYSIS, ROUTINE W REFLEX MICROSCOPIC (NOT AT [facility]) - Abnormal; Notable for the following:    Glucose, UA >1000 (*)    Ketones, ur 15 (*)    Nitrite POSITIVE  (*)    Leukocytes, UA SMALL (*)    All other components within normal limits  URINE MICROSCOPIC-ADD ON - Abnormal; Notable for the following:    Bacteria, UA MANY (*)    All other components within normal limits  LIPASE, BLOOD    Imaging Review Ct Abdomen Pelvis W Contrast  11/22/2014   CLINICAL DATA:  Generalized abdominal pain since yesterday. Intermittent fever  EXAM: CT ABDOMEN AND PELVIS WITH CONTRAST  TECHNIQUE: Multidetector CT imaging of the abdomen and pelvis was performed using the standard protocol following bolus administration of intravenous contrast.  CONTRAST:  161mL OMNIPAQUE IOHEXOL 300 MG/ML  SOLN  COMPARISON:  07/03/2014  FINDINGS: BODY WALL: Left gluteal injection changes.  Fatty bilateral inguinal hernia.  LOWER CHEST: No contributory findings.  ABDOMEN/PELVIS:  Liver: Hepatic steatosis with sparing around the gallbladder fossa.  Biliary: No evidence of biliary obstruction or stone.  Pancreas: Unremarkable.  Spleen: Stable mild splenomegaly with 7 cm thickness and 16 cm craniocaudal span.  Adrenals: Unremarkable.  Kidneys and ureters: Patchy hypo enhancement of the left kidney with asymmetric perinephric edema and left upper urothelial thickening and enhancement. No stone, hydronephrosis, or abscess. A low-density in the lower pole left kidney is consistent with 1 cm cyst.  Bladder: Unremarkable.  Reproductive: Hysterectomy and probable left oophorectomy.  Bowel: No obstruction. Appendectomy.  Retroperitoneum: No mass or adenopathy.  Peritoneum: No ascites or pneumoperitoneum.  Vascular: No acute abnormality.  OSSEOUS: No acute abnormalities.  IMPRESSION: 1. Left pyelonephritis.  No abscess or hydronephrosis. 2. Hepatic steatosis. 3. Chronic mild splenomegaly.   Electronically Signed   By: Monte Fantasia M.D.   On: 11/22/2014 12:16   Dg Chest Port 1 View  11/22/2014   CLINICAL DATA:  Epigastric pain, nausea common fever for 2 days  EXAM: PORTABLE CHEST - 1 VIEW  COMPARISON:   10/30/2013  FINDINGS: Normal heart size and mediastinal contours. No acute infiltrate or edema. No effusion or pneumothorax. No acute osseous findings.  IMPRESSION: Negative portable chest.   Electronically Signed   By: Monte Fantasia M.D.   On: 11/22/2014 10:41     EKG Interpretation None      MDM   Final diagnoses:  Epigastric pain  Acute pyelonephritis   Patient presents with worsening abdominal pain discussed differential diagnosis. Patient received Rocephin at primary care office. CT scan results reviewed acute palatal nephritis, urinalysis also infected. No other findings on CT scan. No vomiting in ER. Plan for trial of outpatient treatment with Cipro Zofran and oral fluids. EKG done due to epigastric pain heart rate 89, no acute ST changes, normal QT, poor R wave progression sinus. Results and differential diagnosis were discussed with the patient/parent/guardian. Close follow up outpatient was discussed, comfortable with the plan.   Medications  sodium chloride 0.9 % bolus 1,000 mL (0 mLs Intravenous Stopped 11/22/14 1130)  morphine 4 MG/ML injection 4 mg (4 mg Intravenous Given 11/22/14 1026)  ondansetron (ZOFRAN) injection 4 mg (4 mg Intravenous Given 11/22/14 1026)  iohexol (OMNIPAQUE) 300 MG/ML solution 25 mL (25 mLs Oral Contrast Given 11/22/14 1045)  iohexol (OMNIPAQUE) 300 MG/ML solution 100 mL (100 mLs Intravenous Contrast Given 11/22/14 1155)    Filed Vitals:   11/22/14 1205 11/22/14 1230 11/22/14 1245 11/22/14 1259  BP: 115/65 107/69 119/68   Pulse: 86 85 86   Temp:    98.5 F (36.9 C)  TempSrc:    Oral  Resp: 21 25 26    Height:      Weight:      SpO2: 97% 93% 96%     Final diagnoses:  Epigastric pain  Acute pyelonephritis      Elnora Morrison, MD 11/22/14 1300

## 2014-11-22 NOTE — ED Notes (Addendum)
Pt comes from home with c/o generalized abd pain since yesterday. Pt has had loss of appetite, nausea, headache, and intermittent fever (101.5) since Wednesday night.  Pt saw PCP this morning and had UA, HbA1c and shot of Rocephin.  PCP recommended her be seen in the ED for possible perfed ulcer vs. Diverticulitis.  Pt had previous test for ulcer years ago, which came back positive.  Pt states epigastric/abd pain is okay when resting, but is worst pain 10/10 when palpated. Pt did take 2 extra-strength Tylenol this morning.

## 2014-11-22 NOTE — ED Notes (Signed)
MD at bedside. 

## 2014-11-22 NOTE — ED Notes (Signed)
Patient transported to CT 

## 2014-11-22 NOTE — ED Notes (Signed)
Pt finished oral contrast. CT made aware.

## 2014-11-22 NOTE — Discharge Instructions (Signed)
If you were given medicines take as directed.  If you are on coumadin or contraceptives realize their levels and effectiveness is altered by many different medicines.  If you have any reaction (rash, tongues swelling, other) to the medicines stop taking and see a physician.    If your blood pressure was elevated in the ER make sure you follow up for management with a primary doctor or return for chest pain, shortness of breath or stroke symptoms.  Please follow up as directed and return to the ER or see a physician for new or worsening symptoms.  Thank you. Filed Vitals:   11/22/14 1045 11/22/14 1115 11/22/14 1205 11/22/14 1230  BP: 109/62 115/66 115/65 107/69  Pulse: 92 79 86 85  Temp:      TempSrc:      Resp: 25 21 21 25   Height:      Weight:      SpO2: 95% 95% 97% 93%

## 2014-11-22 NOTE — ED Notes (Signed)
Pt ambulated to the bathroom with ease 

## 2015-08-07 ENCOUNTER — Other Ambulatory Visit: Payer: Self-pay

## 2015-08-07 DIAGNOSIS — Z1231 Encounter for screening mammogram for malignant neoplasm of breast: Secondary | ICD-10-CM

## 2015-09-29 ENCOUNTER — Ambulatory Visit
Admission: RE | Admit: 2015-09-29 | Discharge: 2015-09-29 | Disposition: A | Discharge status: Home / Self Care | Payer: BLUE CROSS/BLUE SHIELD | Source: Ambulatory Visit

## 2015-09-29 DIAGNOSIS — Z1231 Encounter for screening mammogram for malignant neoplasm of breast: Secondary | ICD-10-CM

## 2015-10-29 ENCOUNTER — Encounter: Payer: Self-pay | Admitting: Gastroenterology

## 2016-03-03 ENCOUNTER — Other Ambulatory Visit: Payer: Self-pay | Admitting: Family Medicine

## 2016-03-03 DIAGNOSIS — R197 Diarrhea, unspecified: Secondary | ICD-10-CM

## 2016-03-03 DIAGNOSIS — R110 Nausea: Secondary | ICD-10-CM

## 2016-03-03 DIAGNOSIS — R109 Unspecified abdominal pain: Secondary | ICD-10-CM

## 2016-03-12 ENCOUNTER — Ambulatory Visit
Admission: RE | Admit: 2016-03-12 | Discharge: 2016-03-12 | Disposition: A | Discharge status: Home / Self Care | Payer: BLUE CROSS/BLUE SHIELD | Source: Ambulatory Visit | Attending: Family Medicine | Admitting: Family Medicine

## 2016-03-12 DIAGNOSIS — R110 Nausea: Secondary | ICD-10-CM

## 2016-03-12 DIAGNOSIS — R197 Diarrhea, unspecified: Secondary | ICD-10-CM

## 2016-03-12 DIAGNOSIS — R109 Unspecified abdominal pain: Secondary | ICD-10-CM

## 2016-03-16 ENCOUNTER — Other Ambulatory Visit: Payer: Self-pay | Admitting: Family Medicine

## 2016-03-16 DIAGNOSIS — R109 Unspecified abdominal pain: Secondary | ICD-10-CM

## 2016-03-23 ENCOUNTER — Inpatient Hospital Stay: Admission: RE | Admit: 2016-03-23 | Payer: BLUE CROSS/BLUE SHIELD | Source: Ambulatory Visit

## 2016-03-24 ENCOUNTER — Other Ambulatory Visit: Payer: BLUE CROSS/BLUE SHIELD

## 2016-03-25 ENCOUNTER — Ambulatory Visit
Admission: RE | Admit: 2016-03-25 | Discharge: 2016-03-25 | Disposition: A | Discharge status: Home / Self Care | Payer: BLUE CROSS/BLUE SHIELD | Source: Ambulatory Visit | Attending: Family Medicine | Admitting: Family Medicine

## 2016-03-25 DIAGNOSIS — R109 Unspecified abdominal pain: Secondary | ICD-10-CM

## 2016-03-25 MED ORDER — IOPAMIDOL (ISOVUE-300) INJECTION 61%
100.0000 mL | Freq: Once | INTRAVENOUS | Status: AC | PRN
  Administered 2016-03-25: 100 mL via INTRAVENOUS

## 2016-09-08 ENCOUNTER — Encounter: Payer: Self-pay | Admitting: Gastroenterology

## 2017-06-24 ENCOUNTER — Other Ambulatory Visit: Payer: Self-pay | Admitting: Family Medicine

## 2017-06-24 DIAGNOSIS — R1013 Epigastric pain: Secondary | ICD-10-CM

## 2017-06-27 ENCOUNTER — Other Ambulatory Visit: Payer: BLUE CROSS/BLUE SHIELD

## 2018-05-03 ENCOUNTER — Other Ambulatory Visit: Payer: Self-pay | Admitting: Family Medicine

## 2018-05-03 DIAGNOSIS — Z1231 Encounter for screening mammogram for malignant neoplasm of breast: Secondary | ICD-10-CM

## 2018-05-08 ENCOUNTER — Other Ambulatory Visit: Payer: Self-pay | Admitting: Family Medicine

## 2018-05-08 DIAGNOSIS — Z1231 Encounter for screening mammogram for malignant neoplasm of breast: Secondary | ICD-10-CM

## 2018-06-21 ENCOUNTER — Ambulatory Visit
Admission: RE | Admit: 2018-06-21 | Discharge: 2018-06-21 | Disposition: A | Discharge status: Home / Self Care | Payer: PPO | Source: Ambulatory Visit | Attending: Family Medicine | Admitting: Family Medicine

## 2018-06-21 DIAGNOSIS — Z1231 Encounter for screening mammogram for malignant neoplasm of breast: Secondary | ICD-10-CM | POA: Diagnosis not present

## 2018-06-26 DIAGNOSIS — E559 Vitamin D deficiency, unspecified: Secondary | ICD-10-CM | POA: Diagnosis not present

## 2018-06-26 DIAGNOSIS — R5383 Other fatigue: Secondary | ICD-10-CM | POA: Diagnosis not present

## 2018-06-29 ENCOUNTER — Encounter: Payer: Self-pay | Admitting: Gastroenterology

## 2018-06-30 DIAGNOSIS — I10 Essential (primary) hypertension: Secondary | ICD-10-CM | POA: Diagnosis not present

## 2018-06-30 DIAGNOSIS — E1165 Type 2 diabetes mellitus with hyperglycemia: Secondary | ICD-10-CM | POA: Diagnosis not present

## 2018-06-30 DIAGNOSIS — K219 Gastro-esophageal reflux disease without esophagitis: Secondary | ICD-10-CM | POA: Diagnosis not present

## 2018-06-30 DIAGNOSIS — F339 Major depressive disorder, recurrent, unspecified: Secondary | ICD-10-CM | POA: Diagnosis not present

## 2018-06-30 DIAGNOSIS — R109 Unspecified abdominal pain: Secondary | ICD-10-CM | POA: Diagnosis not present

## 2018-06-30 DIAGNOSIS — Z0000 Encounter for general adult medical examination without abnormal findings: Secondary | ICD-10-CM | POA: Diagnosis not present

## 2018-07-10 ENCOUNTER — Encounter: Payer: Self-pay | Admitting: Gastroenterology

## 2018-07-10 ENCOUNTER — Ambulatory Visit (AMBULATORY_SURGERY_CENTER): Payer: Self-pay

## 2018-07-10 VITALS — Ht 60.0 in | Wt 196.2 lb

## 2018-07-10 DIAGNOSIS — Z86010 Personal history of colonic polyps: Secondary | ICD-10-CM

## 2018-07-10 MED ORDER — PEG 3350-KCL-NA BICARB-NACL 420 G PO SOLR: 4000.0000 mL | Freq: Once | ORAL | 0 refills | Status: AC

## 2018-07-10 NOTE — Progress Notes (Signed)
Denies allergies to eggs or soy products. Denies complication of anesthesia or sedation. Denies use of weight loss medication. Denies use of O2.   Emmi instructions declined.  

## 2018-07-24 ENCOUNTER — Ambulatory Visit (AMBULATORY_SURGERY_CENTER): Payer: PPO | Admitting: Gastroenterology

## 2018-07-24 ENCOUNTER — Encounter: Payer: Self-pay | Admitting: Gastroenterology

## 2018-07-24 VITALS — BP 109/65 | HR 84 | Temp 97.1°F | Resp 14 | Ht 60.0 in | Wt 196.0 lb

## 2018-07-24 DIAGNOSIS — D120 Benign neoplasm of cecum: Secondary | ICD-10-CM | POA: Diagnosis not present

## 2018-07-24 DIAGNOSIS — D122 Benign neoplasm of ascending colon: Secondary | ICD-10-CM | POA: Diagnosis not present

## 2018-07-24 DIAGNOSIS — Z1211 Encounter for screening for malignant neoplasm of colon: Secondary | ICD-10-CM | POA: Diagnosis not present

## 2018-07-24 DIAGNOSIS — D123 Benign neoplasm of transverse colon: Secondary | ICD-10-CM | POA: Diagnosis not present

## 2018-07-24 DIAGNOSIS — Z86010 Personal history of colonic polyps: Secondary | ICD-10-CM | POA: Diagnosis not present

## 2018-07-24 MED ORDER — SODIUM CHLORIDE 0.9 % IV SOLN: 500.0000 mL | Freq: Once | INTRAVENOUS | Status: DC

## 2018-07-24 NOTE — Patient Instructions (Signed)
YOU HAD AN ENDOSCOPIC PROCEDURE TODAY AT [facility]:   Refer to the procedure report that was given to you for any specific questions about what was found during the examination.  If the procedure report does not answer your questions, please call your gastroenterologist to clarify.  If you requested that your care partner not be given the details of your procedure findings, then the procedure report has been included in a sealed envelope for you to review at your convenience later.  **Handouts given on polyps and hemorrhoids**     YOU SHOULD EXPECT: Some feelings of bloating in the abdomen. Passage of more gas than usual.  Walking can help get rid of the air that was put into your GI tract during the procedure and reduce the bloating. If you had a lower endoscopy (such as a colonoscopy or flexible sigmoidoscopy) you may notice spotting of blood in your stool or on the toilet paper. If you underwent a bowel prep for your procedure, you may not have a normal bowel movement for a few days.  Please Note:  You might notice some irritation and congestion in your nose or some drainage.  This is from the oxygen used during your procedure.  There is no need for concern and it should clear up in a day or so.  SYMPTOMS TO REPORT IMMEDIATELY:   Following lower endoscopy (colonoscopy or flexible sigmoidoscopy):  Excessive amounts of blood in the stool  Significant tenderness or worsening of abdominal pains  Swelling of the abdomen that is new, acute  Fever of 100F or higher   For urgent or emergent issues, a gastroenterologist can be reached at any hour by calling [phone].   DIET:  We do recommend a small meal at first, but then you may proceed to your regular diet.  Drink plenty of fluids but you should avoid alcoholic beverages for 24 hours.  ACTIVITY:  You should plan to take it easy for the rest of today and you should NOT DRIVE or use heavy machinery until tomorrow (because  of the sedation medicines used during the test).    FOLLOW UP: Our staff will call the number listed on your records the next business day following your procedure to check on you and address any questions or concerns that you may have regarding the information given to you following your procedure. If we do not reach you, we will leave a message.  However, if you are feeling well and you are not experiencing any problems, there is no need to return our call.  We will assume that you have returned to your regular daily activities without incident.  If any biopsies were taken you will be contacted by phone or by letter within the next 1-3 weeks.  Please call us at [phone] if you have not heard about the biopsies in 3 weeks.    SIGNATURES/CONFIDENTIALITY: You and/or your care partner have signed paperwork which will be entered into your electronic medical record.  These signatures attest to the fact that that the information above on your After Visit Summary has been reviewed and is understood.  Full responsibility of the confidentiality of this discharge information lies with you and/or your care-partner.

## 2018-07-24 NOTE — Progress Notes (Signed)
Called to room to assist during endoscopic procedure.  Patient ID and intended procedure confirmed with present staff. Received instructions for my participation in the procedure from the performing physician.  

## 2018-07-24 NOTE — Progress Notes (Signed)
A and O x3. Report to RN. Tolerated MAC anesthesia well.

## 2018-07-24 NOTE — Op Note (Signed)
[place] Patient Name: Joan Vargas Procedure Date: 07/24/2018 12:04 PM MRN: [id number] Endoscopist: Ladene Artist , MD Age: 63 Referring MD:  Date of Birth: [birth year] Gender: Female Account #: [id number] Procedure:                Colonoscopy Indications:              Surveillance: Personal history of adenomatous                            polyps on last colonoscopy > 5 years ago Medicines:                Monitored Anesthesia Care Procedure:                Pre-Anesthesia Assessment:                           - Prior to the procedure, a History and Physical                            was performed, and patient medications and                            allergies were reviewed. The patient's tolerance of                            previous anesthesia was also reviewed. The risks                            and benefits of the procedure and the sedation                            options and risks were discussed with the patient.                            All questions were answered, and informed consent                            was obtained. Prior Anticoagulants: The patient has                            taken no previous anticoagulant or antiplatelet                            agents. ASA Grade Assessment: II - A patient with                            mild systemic disease. After reviewing the risks                            and benefits, the patient was deemed in                            satisfactory condition to undergo the procedure.  After obtaining informed consent, the colonoscope                            was passed under direct vision. Throughout the                            procedure, the patient's blood pressure, pulse, and                            oxygen saturations were monitored continuously. The                            Colonoscope was introduced through the anus and                            advanced to the the cecum,  identified by                            appendiceal orifice and ileocecal valve. The                            ileocecal valve, appendiceal orifice, and rectum                            were photographed. The quality of the bowel                            preparation was good. The colonoscopy was performed                            without difficulty. The patient tolerated the                            procedure well. Scope In: 12:18:51 PM Scope Out: 12:37:28 PM Scope Withdrawal Time: 0 hours 16 minutes 42 seconds  Total Procedure Duration: 0 hours 18 minutes 37 seconds  Findings:                 The perianal and digital rectal examinations were                            normal.                           A 14 mm polyp was found in the cecum. The polyp was                            sessile. The polyp was removed with a hot snare.                            Resection and retrieval were complete.                           Three sessile polyps were found in the transverse  colon (2) and ascending colon. The polyps were 6 to                            7 mm in size. These polyps were removed with a cold                            snare. Resection and retrieval were complete.                           Internal hemorrhoids were found during                            retroflexion. The hemorrhoids were small and Grade                            I (internal hemorrhoids that do not prolapse).                           The exam was otherwise without abnormality on                            direct and retroflexion views. Complications:            No immediate complications. Estimated blood loss:                            None. Estimated Blood Loss:     Estimated blood loss: none. Impression:               - One 14 mm polyp in the cecum, removed with a hot                            snare. Resected and retrieved.                           - Three 6 to 7 mm polyps  in the transverse colon                            and in the ascending colon, removed with a cold                            snare. Resected and retrieved.                           - Internal hemorrhoids.                           - The examination was otherwise normal on direct                            and retroflexion views. Recommendation:           - Repeat colonoscopy in 3 years for surveillance.                           -  Patient has a contact number available for                            emergencies. The signs and symptoms of potential                            delayed complications were discussed with the                            patient. Return to normal activities tomorrow.                            Written discharge instructions were provided to the                            patient.                           - Resume previous diet.                           - Continue present medications.                           - Await pathology results.                           - No aspirin, ibuprofen, naproxen, or other                            non-steroidal anti-inflammatory drugs for 2 weeks                            after polyp removal. Ladene Artist, MD 07/24/2018 12:42:28 PM This report has been signed electronically.

## 2018-07-25 ENCOUNTER — Telehealth: Payer: Self-pay

## 2018-07-25 NOTE — Telephone Encounter (Signed)
  Follow up Call-  Call back number 07/24/2018  Post procedure Call Back phone  # [phone]  Permission to leave phone message Yes  Some recent data might be hidden     Patient questions:  Do you have a fever, pain , or abdominal swelling? No. Pain Score  0 *  Have you tolerated food without any problems? Yes.    Have you been able to return to your normal activities? Yes.    Do you have any questions about your discharge instructions: Diet   No. Medications  No. Follow up visit  No.  Do you have questions or concerns about your Care? No.  Actions: * If pain score is 4 or above: No action needed, pain <4.

## 2018-07-28 DIAGNOSIS — Z6836 Body mass index (BMI) 36.0-36.9, adult: Secondary | ICD-10-CM | POA: Diagnosis not present

## 2018-07-28 DIAGNOSIS — K219 Gastro-esophageal reflux disease without esophagitis: Secondary | ICD-10-CM | POA: Diagnosis not present

## 2018-07-28 DIAGNOSIS — M19011 Primary osteoarthritis, right shoulder: Secondary | ICD-10-CM | POA: Diagnosis not present

## 2018-07-28 DIAGNOSIS — M7541 Impingement syndrome of right shoulder: Secondary | ICD-10-CM | POA: Diagnosis not present

## 2018-07-28 DIAGNOSIS — E1165 Type 2 diabetes mellitus with hyperglycemia: Secondary | ICD-10-CM | POA: Diagnosis not present

## 2018-08-09 ENCOUNTER — Encounter: Payer: Self-pay | Admitting: Gastroenterology

## 2018-08-21 DIAGNOSIS — E119 Type 2 diabetes mellitus without complications: Secondary | ICD-10-CM | POA: Diagnosis not present

## 2018-08-21 DIAGNOSIS — E1165 Type 2 diabetes mellitus with hyperglycemia: Secondary | ICD-10-CM | POA: Diagnosis not present

## 2018-08-21 DIAGNOSIS — E039 Hypothyroidism, unspecified: Secondary | ICD-10-CM | POA: Diagnosis not present

## 2018-08-25 DIAGNOSIS — E1165 Type 2 diabetes mellitus with hyperglycemia: Secondary | ICD-10-CM | POA: Diagnosis not present

## 2018-08-25 DIAGNOSIS — Z6836 Body mass index (BMI) 36.0-36.9, adult: Secondary | ICD-10-CM | POA: Diagnosis not present

## 2018-08-25 DIAGNOSIS — E039 Hypothyroidism, unspecified: Secondary | ICD-10-CM | POA: Diagnosis not present

## 2018-08-25 DIAGNOSIS — I10 Essential (primary) hypertension: Secondary | ICD-10-CM | POA: Diagnosis not present

## 2018-09-29 DIAGNOSIS — M549 Dorsalgia, unspecified: Secondary | ICD-10-CM | POA: Diagnosis not present

## 2018-10-11 ENCOUNTER — Other Ambulatory Visit: Payer: Self-pay | Admitting: *Deleted

## 2018-10-11 NOTE — Patient Outreach (Signed)
[facility]) Care Management  10/11/2018  Joan Vargas Nov 23, 1955 [id number]  RN Health coach sent a welcome letter. As a [employer] health plan.  Plan: Next follow up outreach within the month of June  [name] [place] [facility] [phone]

## 2018-11-23 DIAGNOSIS — E782 Mixed hyperlipidemia: Secondary | ICD-10-CM | POA: Diagnosis not present

## 2018-11-23 DIAGNOSIS — E1165 Type 2 diabetes mellitus with hyperglycemia: Secondary | ICD-10-CM | POA: Diagnosis not present

## 2018-11-28 DIAGNOSIS — E1165 Type 2 diabetes mellitus with hyperglycemia: Secondary | ICD-10-CM | POA: Diagnosis not present

## 2018-11-28 DIAGNOSIS — E782 Mixed hyperlipidemia: Secondary | ICD-10-CM | POA: Diagnosis not present

## 2018-11-28 DIAGNOSIS — G2581 Restless legs syndrome: Secondary | ICD-10-CM | POA: Diagnosis not present

## 2018-11-28 DIAGNOSIS — M797 Fibromyalgia: Secondary | ICD-10-CM | POA: Diagnosis not present

## 2018-11-28 DIAGNOSIS — J309 Allergic rhinitis, unspecified: Secondary | ICD-10-CM | POA: Diagnosis not present

## 2018-11-29 ENCOUNTER — Ambulatory Visit: Payer: Self-pay | Admitting: *Deleted

## 2018-11-29 ENCOUNTER — Other Ambulatory Visit: Payer: Self-pay | Admitting: *Deleted

## 2018-11-29 NOTE — Patient Outreach (Signed)
[facility] [facility]) Care Management  11/29/2018  Joan Vargas March 21, 1956 [id number]   RN Health Coach is closing this program. Consumer is enrolled in [employer] external program.  [place] Care Management [phone]

## 2018-12-04 ENCOUNTER — Encounter: Payer: Self-pay | Admitting: *Deleted

## 2018-12-04 NOTE — Telephone Encounter (Signed)
This encounter was created in error - please disregard.

## 2019-01-05 DIAGNOSIS — N390 Urinary tract infection, site not specified: Secondary | ICD-10-CM | POA: Diagnosis not present

## 2019-01-11 ENCOUNTER — Other Ambulatory Visit: Payer: Self-pay

## 2019-02-08 DIAGNOSIS — Z23 Encounter for immunization: Secondary | ICD-10-CM | POA: Diagnosis not present

## 2019-02-21 DIAGNOSIS — K219 Gastro-esophageal reflux disease without esophagitis: Secondary | ICD-10-CM | POA: Diagnosis not present

## 2019-02-21 DIAGNOSIS — N390 Urinary tract infection, site not specified: Secondary | ICD-10-CM | POA: Diagnosis not present

## 2019-02-21 DIAGNOSIS — E1165 Type 2 diabetes mellitus with hyperglycemia: Secondary | ICD-10-CM | POA: Diagnosis not present

## 2019-02-21 DIAGNOSIS — I10 Essential (primary) hypertension: Secondary | ICD-10-CM | POA: Diagnosis not present

## 2019-03-28 DIAGNOSIS — I10 Essential (primary) hypertension: Secondary | ICD-10-CM | POA: Diagnosis not present

## 2019-03-28 DIAGNOSIS — E1165 Type 2 diabetes mellitus with hyperglycemia: Secondary | ICD-10-CM | POA: Diagnosis not present

## 2019-03-28 DIAGNOSIS — M549 Dorsalgia, unspecified: Secondary | ICD-10-CM | POA: Diagnosis not present

## 2019-05-16 ENCOUNTER — Other Ambulatory Visit: Payer: Self-pay | Admitting: Family Medicine

## 2019-05-16 DIAGNOSIS — Z1231 Encounter for screening mammogram for malignant neoplasm of breast: Secondary | ICD-10-CM

## 2019-05-23 DIAGNOSIS — E782 Mixed hyperlipidemia: Secondary | ICD-10-CM | POA: Diagnosis not present

## 2019-05-30 DIAGNOSIS — Z6835 Body mass index (BMI) 35.0-35.9, adult: Secondary | ICD-10-CM | POA: Diagnosis not present

## 2019-05-30 DIAGNOSIS — M797 Fibromyalgia: Secondary | ICD-10-CM | POA: Diagnosis not present

## 2019-05-30 DIAGNOSIS — R519 Headache, unspecified: Secondary | ICD-10-CM | POA: Diagnosis not present

## 2019-05-30 DIAGNOSIS — E1165 Type 2 diabetes mellitus with hyperglycemia: Secondary | ICD-10-CM | POA: Diagnosis not present

## 2019-05-30 DIAGNOSIS — I10 Essential (primary) hypertension: Secondary | ICD-10-CM | POA: Diagnosis not present

## 2019-07-03 DIAGNOSIS — R519 Headache, unspecified: Secondary | ICD-10-CM | POA: Diagnosis not present

## 2019-07-03 DIAGNOSIS — J309 Allergic rhinitis, unspecified: Secondary | ICD-10-CM | POA: Diagnosis not present

## 2019-07-03 DIAGNOSIS — I10 Essential (primary) hypertension: Secondary | ICD-10-CM | POA: Diagnosis not present

## 2019-07-03 DIAGNOSIS — Z0000 Encounter for general adult medical examination without abnormal findings: Secondary | ICD-10-CM | POA: Diagnosis not present

## 2019-07-06 ENCOUNTER — Other Ambulatory Visit: Payer: Self-pay

## 2019-07-06 ENCOUNTER — Ambulatory Visit
Admission: RE | Admit: 2019-07-06 | Discharge: 2019-07-06 | Disposition: A | Discharge status: Home / Self Care | Payer: PPO | Source: Ambulatory Visit | Attending: Family Medicine | Admitting: Family Medicine

## 2019-07-06 DIAGNOSIS — Z1231 Encounter for screening mammogram for malignant neoplasm of breast: Secondary | ICD-10-CM

## 2019-07-17 DIAGNOSIS — Z1211 Encounter for screening for malignant neoplasm of colon: Secondary | ICD-10-CM | POA: Diagnosis not present

## 2019-07-25 DIAGNOSIS — Z719 Counseling, unspecified: Secondary | ICD-10-CM | POA: Diagnosis not present

## 2019-07-25 DIAGNOSIS — N952 Postmenopausal atrophic vaginitis: Secondary | ICD-10-CM | POA: Diagnosis not present

## 2019-07-25 DIAGNOSIS — N390 Urinary tract infection, site not specified: Secondary | ICD-10-CM | POA: Diagnosis not present

## 2019-07-25 DIAGNOSIS — E1165 Type 2 diabetes mellitus with hyperglycemia: Secondary | ICD-10-CM | POA: Diagnosis not present

## 2019-08-23 DIAGNOSIS — I10 Essential (primary) hypertension: Secondary | ICD-10-CM | POA: Diagnosis not present

## 2019-08-23 DIAGNOSIS — E559 Vitamin D deficiency, unspecified: Secondary | ICD-10-CM | POA: Diagnosis not present

## 2019-08-23 DIAGNOSIS — E1165 Type 2 diabetes mellitus with hyperglycemia: Secondary | ICD-10-CM | POA: Diagnosis not present

## 2019-08-30 DIAGNOSIS — E1165 Type 2 diabetes mellitus with hyperglycemia: Secondary | ICD-10-CM | POA: Diagnosis not present

## 2019-08-30 DIAGNOSIS — E559 Vitamin D deficiency, unspecified: Secondary | ICD-10-CM | POA: Diagnosis not present

## 2019-08-30 DIAGNOSIS — I10 Essential (primary) hypertension: Secondary | ICD-10-CM | POA: Diagnosis not present

## 2019-12-06 DIAGNOSIS — E782 Mixed hyperlipidemia: Secondary | ICD-10-CM | POA: Diagnosis not present

## 2019-12-06 DIAGNOSIS — E039 Hypothyroidism, unspecified: Secondary | ICD-10-CM | POA: Diagnosis not present

## 2019-12-06 DIAGNOSIS — E1165 Type 2 diabetes mellitus with hyperglycemia: Secondary | ICD-10-CM | POA: Diagnosis not present

## 2019-12-06 DIAGNOSIS — Z0000 Encounter for general adult medical examination without abnormal findings: Secondary | ICD-10-CM | POA: Diagnosis not present

## 2019-12-06 DIAGNOSIS — Z0184 Encounter for antibody response examination: Secondary | ICD-10-CM | POA: Diagnosis not present

## 2019-12-06 DIAGNOSIS — E119 Type 2 diabetes mellitus without complications: Secondary | ICD-10-CM | POA: Diagnosis not present

## 2019-12-10 DIAGNOSIS — E1165 Type 2 diabetes mellitus with hyperglycemia: Secondary | ICD-10-CM | POA: Diagnosis not present

## 2019-12-10 DIAGNOSIS — I10 Essential (primary) hypertension: Secondary | ICD-10-CM | POA: Diagnosis not present

## 2019-12-10 DIAGNOSIS — E782 Mixed hyperlipidemia: Secondary | ICD-10-CM | POA: Diagnosis not present

## 2019-12-10 DIAGNOSIS — G2581 Restless legs syndrome: Secondary | ICD-10-CM | POA: Diagnosis not present

## 2019-12-10 DIAGNOSIS — M797 Fibromyalgia: Secondary | ICD-10-CM | POA: Diagnosis not present

## 2020-01-08 ENCOUNTER — Other Ambulatory Visit: Payer: Self-pay

## 2020-02-11 DIAGNOSIS — F411 Generalized anxiety disorder: Secondary | ICD-10-CM | POA: Diagnosis not present

## 2020-02-11 DIAGNOSIS — F331 Major depressive disorder, recurrent, moderate: Secondary | ICD-10-CM | POA: Diagnosis not present

## 2020-02-11 DIAGNOSIS — G4700 Insomnia, unspecified: Secondary | ICD-10-CM | POA: Diagnosis not present

## 2020-02-11 DIAGNOSIS — G2581 Restless legs syndrome: Secondary | ICD-10-CM | POA: Diagnosis not present

## 2020-02-19 ENCOUNTER — Other Ambulatory Visit: Payer: Self-pay | Admitting: Family Medicine

## 2020-02-19 ENCOUNTER — Ambulatory Visit
Admission: RE | Admit: 2020-02-19 | Discharge: 2020-02-19 | Disposition: A | Discharge status: Home / Self Care | Payer: PPO | Source: Ambulatory Visit | Attending: Family Medicine | Admitting: Family Medicine

## 2020-02-19 DIAGNOSIS — Z23 Encounter for immunization: Secondary | ICD-10-CM | POA: Diagnosis not present

## 2020-02-19 DIAGNOSIS — M19072 Primary osteoarthritis, left ankle and foot: Secondary | ICD-10-CM | POA: Diagnosis not present

## 2020-02-19 DIAGNOSIS — G8929 Other chronic pain: Secondary | ICD-10-CM | POA: Diagnosis not present

## 2020-02-19 DIAGNOSIS — M79672 Pain in left foot: Secondary | ICD-10-CM

## 2020-02-19 DIAGNOSIS — M7732 Calcaneal spur, left foot: Secondary | ICD-10-CM | POA: Diagnosis not present

## 2020-03-05 DIAGNOSIS — E1165 Type 2 diabetes mellitus with hyperglycemia: Secondary | ICD-10-CM | POA: Diagnosis not present

## 2020-03-10 DIAGNOSIS — E1165 Type 2 diabetes mellitus with hyperglycemia: Secondary | ICD-10-CM | POA: Diagnosis not present

## 2020-03-10 DIAGNOSIS — E1159 Type 2 diabetes mellitus with other circulatory complications: Secondary | ICD-10-CM | POA: Diagnosis not present

## 2020-03-10 DIAGNOSIS — I10 Essential (primary) hypertension: Secondary | ICD-10-CM | POA: Diagnosis not present

## 2020-03-10 DIAGNOSIS — M19072 Primary osteoarthritis, left ankle and foot: Secondary | ICD-10-CM | POA: Diagnosis not present

## 2020-03-10 DIAGNOSIS — I152 Hypertension secondary to endocrine disorders: Secondary | ICD-10-CM | POA: Diagnosis not present

## 2020-03-24 DIAGNOSIS — Z6834 Body mass index (BMI) 34.0-34.9, adult: Secondary | ICD-10-CM | POA: Diagnosis not present

## 2020-03-24 DIAGNOSIS — E6609 Other obesity due to excess calories: Secondary | ICD-10-CM | POA: Diagnosis not present

## 2020-03-24 DIAGNOSIS — E782 Mixed hyperlipidemia: Secondary | ICD-10-CM | POA: Diagnosis not present

## 2020-03-24 DIAGNOSIS — E1159 Type 2 diabetes mellitus with other circulatory complications: Secondary | ICD-10-CM | POA: Diagnosis not present

## 2020-03-24 DIAGNOSIS — I152 Hypertension secondary to endocrine disorders: Secondary | ICD-10-CM | POA: Diagnosis not present

## 2020-03-24 DIAGNOSIS — E1165 Type 2 diabetes mellitus with hyperglycemia: Secondary | ICD-10-CM | POA: Diagnosis not present

## 2020-04-08 DIAGNOSIS — H5213 Myopia, bilateral: Secondary | ICD-10-CM | POA: Diagnosis not present

## 2020-04-08 DIAGNOSIS — E119 Type 2 diabetes mellitus without complications: Secondary | ICD-10-CM | POA: Diagnosis not present

## 2020-04-25 DIAGNOSIS — E1165 Type 2 diabetes mellitus with hyperglycemia: Secondary | ICD-10-CM | POA: Diagnosis not present

## 2020-05-12 DIAGNOSIS — E1165 Type 2 diabetes mellitus with hyperglycemia: Secondary | ICD-10-CM | POA: Diagnosis not present

## 2020-05-12 DIAGNOSIS — J309 Allergic rhinitis, unspecified: Secondary | ICD-10-CM | POA: Diagnosis not present

## 2020-05-26 ENCOUNTER — Other Ambulatory Visit: Payer: Self-pay | Admitting: Family Medicine

## 2020-05-26 DIAGNOSIS — Z1231 Encounter for screening mammogram for malignant neoplasm of breast: Secondary | ICD-10-CM

## 2020-06-03 DIAGNOSIS — E1165 Type 2 diabetes mellitus with hyperglycemia: Secondary | ICD-10-CM | POA: Diagnosis not present

## 2020-06-03 DIAGNOSIS — I10 Essential (primary) hypertension: Secondary | ICD-10-CM | POA: Diagnosis not present

## 2020-06-03 DIAGNOSIS — J309 Allergic rhinitis, unspecified: Secondary | ICD-10-CM | POA: Diagnosis not present

## 2020-06-18 DIAGNOSIS — E039 Hypothyroidism, unspecified: Secondary | ICD-10-CM | POA: Diagnosis not present

## 2020-06-18 DIAGNOSIS — E669 Obesity, unspecified: Secondary | ICD-10-CM | POA: Diagnosis not present

## 2020-06-18 DIAGNOSIS — E1165 Type 2 diabetes mellitus with hyperglycemia: Secondary | ICD-10-CM | POA: Diagnosis not present

## 2020-06-18 DIAGNOSIS — E559 Vitamin D deficiency, unspecified: Secondary | ICD-10-CM | POA: Diagnosis not present

## 2020-06-23 DIAGNOSIS — E1165 Type 2 diabetes mellitus with hyperglycemia: Secondary | ICD-10-CM | POA: Diagnosis not present

## 2020-06-23 DIAGNOSIS — Z7189 Other specified counseling: Secondary | ICD-10-CM | POA: Diagnosis not present

## 2020-06-23 DIAGNOSIS — E559 Vitamin D deficiency, unspecified: Secondary | ICD-10-CM | POA: Diagnosis not present

## 2020-06-23 DIAGNOSIS — E039 Hypothyroidism, unspecified: Secondary | ICD-10-CM | POA: Diagnosis not present

## 2020-07-04 DIAGNOSIS — Z0000 Encounter for general adult medical examination without abnormal findings: Secondary | ICD-10-CM | POA: Diagnosis not present

## 2020-07-04 DIAGNOSIS — Z1331 Encounter for screening for depression: Secondary | ICD-10-CM | POA: Diagnosis not present

## 2020-07-04 DIAGNOSIS — Z1339 Encounter for screening examination for other mental health and behavioral disorders: Secondary | ICD-10-CM | POA: Diagnosis not present

## 2020-07-07 ENCOUNTER — Ambulatory Visit
Admission: RE | Admit: 2020-07-07 | Discharge: 2020-07-07 | Disposition: A | Discharge status: Home / Self Care | Payer: PPO | Source: Ambulatory Visit | Attending: Family Medicine | Admitting: Family Medicine

## 2020-07-07 ENCOUNTER — Other Ambulatory Visit: Payer: Self-pay

## 2020-07-07 DIAGNOSIS — Z1231 Encounter for screening mammogram for malignant neoplasm of breast: Secondary | ICD-10-CM

## 2020-07-09 ENCOUNTER — Other Ambulatory Visit: Payer: Self-pay | Admitting: Family Medicine

## 2020-07-09 DIAGNOSIS — R928 Other abnormal and inconclusive findings on diagnostic imaging of breast: Secondary | ICD-10-CM

## 2020-07-21 ENCOUNTER — Other Ambulatory Visit: Payer: PPO

## 2020-08-05 ENCOUNTER — Ambulatory Visit: Payer: PPO

## 2020-08-05 ENCOUNTER — Ambulatory Visit
Admission: RE | Admit: 2020-08-05 | Discharge: 2020-08-05 | Disposition: A | Discharge status: Home / Self Care | Payer: PPO | Source: Ambulatory Visit | Attending: Family Medicine | Admitting: Family Medicine

## 2020-08-05 ENCOUNTER — Other Ambulatory Visit: Payer: Self-pay

## 2020-08-05 DIAGNOSIS — R922 Inconclusive mammogram: Secondary | ICD-10-CM | POA: Diagnosis not present

## 2020-08-05 DIAGNOSIS — R928 Other abnormal and inconclusive findings on diagnostic imaging of breast: Secondary | ICD-10-CM

## 2020-08-13 DIAGNOSIS — E039 Hypothyroidism, unspecified: Secondary | ICD-10-CM | POA: Diagnosis not present

## 2020-08-18 DIAGNOSIS — E1165 Type 2 diabetes mellitus with hyperglycemia: Secondary | ICD-10-CM | POA: Diagnosis not present

## 2020-08-18 DIAGNOSIS — E782 Mixed hyperlipidemia: Secondary | ICD-10-CM | POA: Diagnosis not present

## 2020-08-18 DIAGNOSIS — E0590 Thyrotoxicosis, unspecified without thyrotoxic crisis or storm: Secondary | ICD-10-CM | POA: Diagnosis not present

## 2020-08-18 DIAGNOSIS — E039 Hypothyroidism, unspecified: Secondary | ICD-10-CM | POA: Diagnosis not present

## 2020-08-18 DIAGNOSIS — I152 Hypertension secondary to endocrine disorders: Secondary | ICD-10-CM | POA: Diagnosis not present

## 2020-08-25 DIAGNOSIS — E782 Mixed hyperlipidemia: Secondary | ICD-10-CM | POA: Diagnosis not present

## 2020-08-25 DIAGNOSIS — Z6835 Body mass index (BMI) 35.0-35.9, adult: Secondary | ICD-10-CM | POA: Diagnosis not present

## 2020-08-25 DIAGNOSIS — I10 Essential (primary) hypertension: Secondary | ICD-10-CM | POA: Diagnosis not present

## 2020-08-25 DIAGNOSIS — E1165 Type 2 diabetes mellitus with hyperglycemia: Secondary | ICD-10-CM | POA: Diagnosis not present

## 2020-08-25 DIAGNOSIS — I152 Hypertension secondary to endocrine disorders: Secondary | ICD-10-CM | POA: Diagnosis not present

## 2020-09-02 DIAGNOSIS — E669 Obesity, unspecified: Secondary | ICD-10-CM | POA: Diagnosis not present

## 2020-09-02 DIAGNOSIS — Z6835 Body mass index (BMI) 35.0-35.9, adult: Secondary | ICD-10-CM | POA: Diagnosis not present

## 2020-09-02 DIAGNOSIS — B349 Viral infection, unspecified: Secondary | ICD-10-CM | POA: Diagnosis not present

## 2020-09-02 DIAGNOSIS — E1165 Type 2 diabetes mellitus with hyperglycemia: Secondary | ICD-10-CM | POA: Diagnosis not present

## 2020-10-28 DIAGNOSIS — N3000 Acute cystitis without hematuria: Secondary | ICD-10-CM | POA: Diagnosis not present

## 2020-11-12 DIAGNOSIS — E559 Vitamin D deficiency, unspecified: Secondary | ICD-10-CM | POA: Diagnosis not present

## 2020-11-12 DIAGNOSIS — M797 Fibromyalgia: Secondary | ICD-10-CM | POA: Diagnosis not present

## 2020-11-12 DIAGNOSIS — E1165 Type 2 diabetes mellitus with hyperglycemia: Secondary | ICD-10-CM | POA: Diagnosis not present

## 2020-11-12 DIAGNOSIS — I10 Essential (primary) hypertension: Secondary | ICD-10-CM | POA: Diagnosis not present

## 2020-11-17 DIAGNOSIS — E559 Vitamin D deficiency, unspecified: Secondary | ICD-10-CM | POA: Diagnosis not present

## 2020-11-17 DIAGNOSIS — E782 Mixed hyperlipidemia: Secondary | ICD-10-CM | POA: Diagnosis not present

## 2020-11-17 DIAGNOSIS — E1165 Type 2 diabetes mellitus with hyperglycemia: Secondary | ICD-10-CM | POA: Diagnosis not present

## 2020-11-17 DIAGNOSIS — Z2821 Immunization not carried out because of patient refusal: Secondary | ICD-10-CM | POA: Diagnosis not present

## 2020-11-17 DIAGNOSIS — Z7189 Other specified counseling: Secondary | ICD-10-CM | POA: Diagnosis not present

## 2021-02-18 DIAGNOSIS — E039 Hypothyroidism, unspecified: Secondary | ICD-10-CM | POA: Diagnosis not present

## 2021-02-18 DIAGNOSIS — E1159 Type 2 diabetes mellitus with other circulatory complications: Secondary | ICD-10-CM | POA: Diagnosis not present

## 2021-02-23 DIAGNOSIS — E1159 Type 2 diabetes mellitus with other circulatory complications: Secondary | ICD-10-CM | POA: Diagnosis not present

## 2021-02-23 DIAGNOSIS — E039 Hypothyroidism, unspecified: Secondary | ICD-10-CM | POA: Diagnosis not present

## 2021-02-23 DIAGNOSIS — M7540 Impingement syndrome of unspecified shoulder: Secondary | ICD-10-CM | POA: Diagnosis not present

## 2021-02-23 DIAGNOSIS — E11319 Type 2 diabetes mellitus with unspecified diabetic retinopathy without macular edema: Secondary | ICD-10-CM | POA: Diagnosis not present

## 2021-02-23 DIAGNOSIS — E782 Mixed hyperlipidemia: Secondary | ICD-10-CM | POA: Diagnosis not present

## 2021-02-23 DIAGNOSIS — E1121 Type 2 diabetes mellitus with diabetic nephropathy: Secondary | ICD-10-CM | POA: Diagnosis not present

## 2021-02-23 DIAGNOSIS — E1165 Type 2 diabetes mellitus with hyperglycemia: Secondary | ICD-10-CM | POA: Diagnosis not present

## 2021-02-23 DIAGNOSIS — M1990 Unspecified osteoarthritis, unspecified site: Secondary | ICD-10-CM | POA: Diagnosis not present

## 2021-02-23 DIAGNOSIS — E1143 Type 2 diabetes mellitus with diabetic autonomic (poly)neuropathy: Secondary | ICD-10-CM | POA: Diagnosis not present

## 2021-02-23 DIAGNOSIS — E1122 Type 2 diabetes mellitus with diabetic chronic kidney disease: Secondary | ICD-10-CM | POA: Diagnosis not present

## 2021-02-23 DIAGNOSIS — I129 Hypertensive chronic kidney disease with stage 1 through stage 4 chronic kidney disease, or unspecified chronic kidney disease: Secondary | ICD-10-CM | POA: Diagnosis not present

## 2021-02-26 DIAGNOSIS — Z23 Encounter for immunization: Secondary | ICD-10-CM | POA: Diagnosis not present

## 2021-02-26 DIAGNOSIS — M7540 Impingement syndrome of unspecified shoulder: Secondary | ICD-10-CM | POA: Diagnosis not present

## 2021-03-24 DIAGNOSIS — M7540 Impingement syndrome of unspecified shoulder: Secondary | ICD-10-CM | POA: Diagnosis not present

## 2021-03-24 DIAGNOSIS — N3000 Acute cystitis without hematuria: Secondary | ICD-10-CM | POA: Diagnosis not present

## 2021-03-25 DIAGNOSIS — Z7185 Encounter for immunization safety counseling: Secondary | ICD-10-CM | POA: Diagnosis not present

## 2021-03-25 DIAGNOSIS — Z23 Encounter for immunization: Secondary | ICD-10-CM | POA: Diagnosis not present

## 2021-03-25 DIAGNOSIS — M25511 Pain in right shoulder: Secondary | ICD-10-CM | POA: Diagnosis not present

## 2021-03-25 DIAGNOSIS — M7541 Impingement syndrome of right shoulder: Secondary | ICD-10-CM | POA: Diagnosis not present

## 2021-03-25 DIAGNOSIS — M7540 Impingement syndrome of unspecified shoulder: Secondary | ICD-10-CM | POA: Diagnosis not present

## 2021-04-17 DIAGNOSIS — H524 Presbyopia: Secondary | ICD-10-CM | POA: Diagnosis not present

## 2021-04-17 DIAGNOSIS — H2513 Age-related nuclear cataract, bilateral: Secondary | ICD-10-CM | POA: Diagnosis not present

## 2021-04-17 DIAGNOSIS — E119 Type 2 diabetes mellitus without complications: Secondary | ICD-10-CM | POA: Diagnosis not present

## 2021-04-27 DIAGNOSIS — N3000 Acute cystitis without hematuria: Secondary | ICD-10-CM | POA: Diagnosis not present

## 2021-05-25 DIAGNOSIS — E559 Vitamin D deficiency, unspecified: Secondary | ICD-10-CM | POA: Diagnosis not present

## 2021-05-25 DIAGNOSIS — E1165 Type 2 diabetes mellitus with hyperglycemia: Secondary | ICD-10-CM | POA: Diagnosis not present

## 2021-05-25 DIAGNOSIS — I10 Essential (primary) hypertension: Secondary | ICD-10-CM | POA: Diagnosis not present

## 2021-05-25 DIAGNOSIS — E782 Mixed hyperlipidemia: Secondary | ICD-10-CM | POA: Diagnosis not present

## 2021-05-28 DIAGNOSIS — I10 Essential (primary) hypertension: Secondary | ICD-10-CM | POA: Diagnosis not present

## 2021-05-28 DIAGNOSIS — M797 Fibromyalgia: Secondary | ICD-10-CM | POA: Diagnosis not present

## 2021-05-28 DIAGNOSIS — E559 Vitamin D deficiency, unspecified: Secondary | ICD-10-CM | POA: Diagnosis not present

## 2021-05-28 DIAGNOSIS — E1165 Type 2 diabetes mellitus with hyperglycemia: Secondary | ICD-10-CM | POA: Diagnosis not present

## 2021-05-28 DIAGNOSIS — G2581 Restless legs syndrome: Secondary | ICD-10-CM | POA: Diagnosis not present

## 2021-05-28 DIAGNOSIS — N3000 Acute cystitis without hematuria: Secondary | ICD-10-CM | POA: Diagnosis not present

## 2021-06-16 ENCOUNTER — Other Ambulatory Visit: Payer: Self-pay | Admitting: Family Medicine

## 2021-06-16 DIAGNOSIS — Z1231 Encounter for screening mammogram for malignant neoplasm of breast: Secondary | ICD-10-CM

## 2021-07-01 ENCOUNTER — Ambulatory Visit
Admission: RE | Admit: 2021-07-01 | Discharge: 2021-07-01 | Disposition: A | Discharge status: Home / Self Care | Payer: Medicare Other | Source: Ambulatory Visit | Attending: Family Medicine | Admitting: Family Medicine

## 2021-07-01 ENCOUNTER — Other Ambulatory Visit: Payer: Self-pay | Admitting: Family Medicine

## 2021-07-01 DIAGNOSIS — M25511 Pain in right shoulder: Secondary | ICD-10-CM

## 2021-07-07 IMAGING — MG MM DIGITAL DIAGNOSTIC UNILAT*L* W/ TOMO W/ CAD
4 series · 4 of 12 positions shown · non-contrast
Comparison: Previous exam(s).

CLINICAL DATA: Recall from screening mammography with
tomosynthesis, possible asymmetry in the INNER LEFT breast at MIDDLE
depth visible only on the CC images.

EXAM:
DIGITAL DIAGNOSTIC UNILATERAL LEFT MAMMOGRAM WITH TOMOSYNTHESIS AND
CAD
TECHNIQUE: Left digital diagnostic mammography and breast tomosynthesis was
performed. The images were evaluated with computer-aided detection.

[L ML synth-2D]
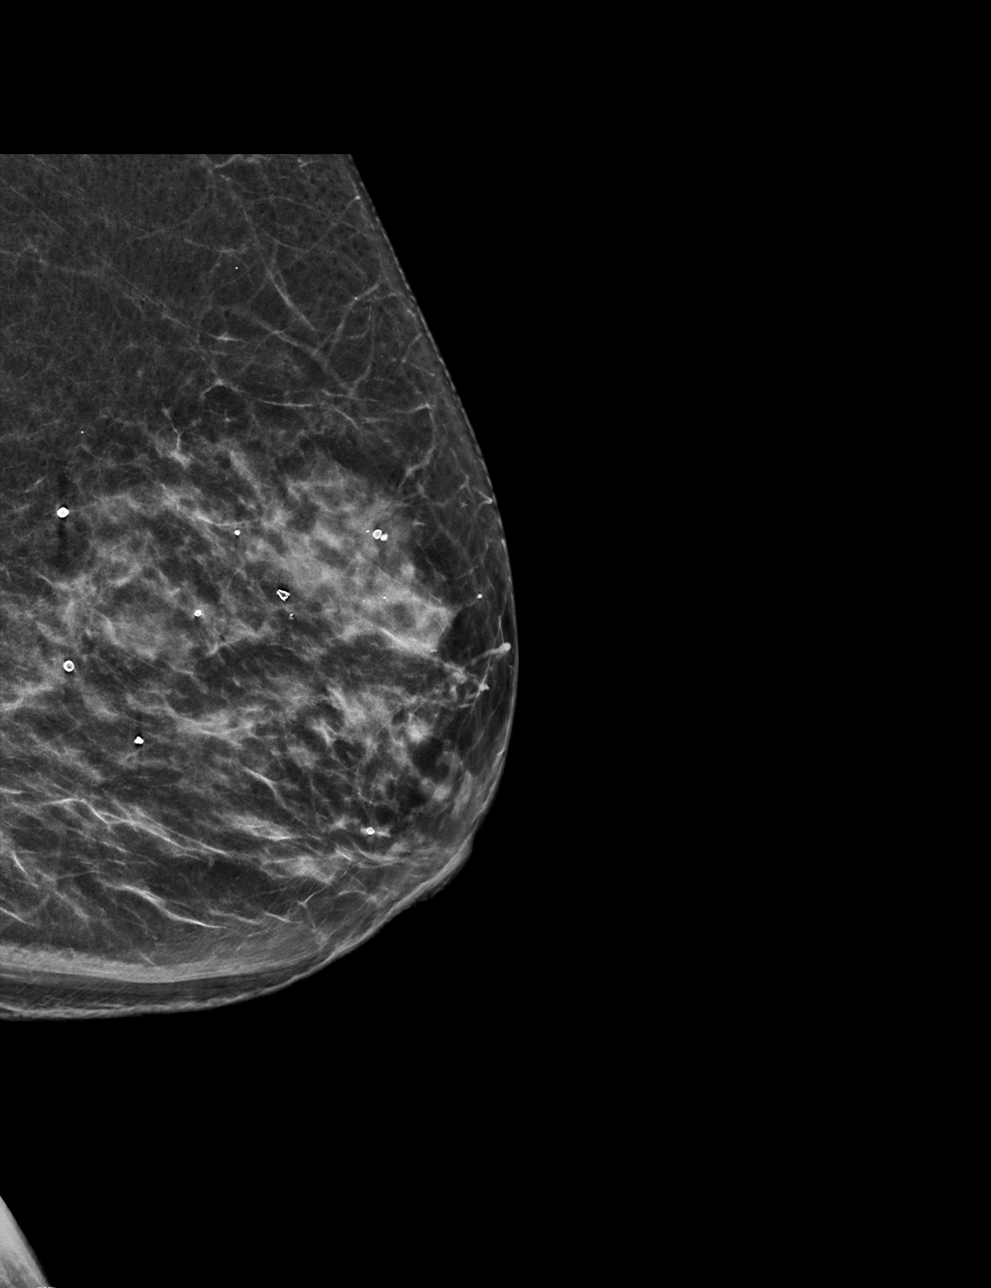

[L CC synth-2D]
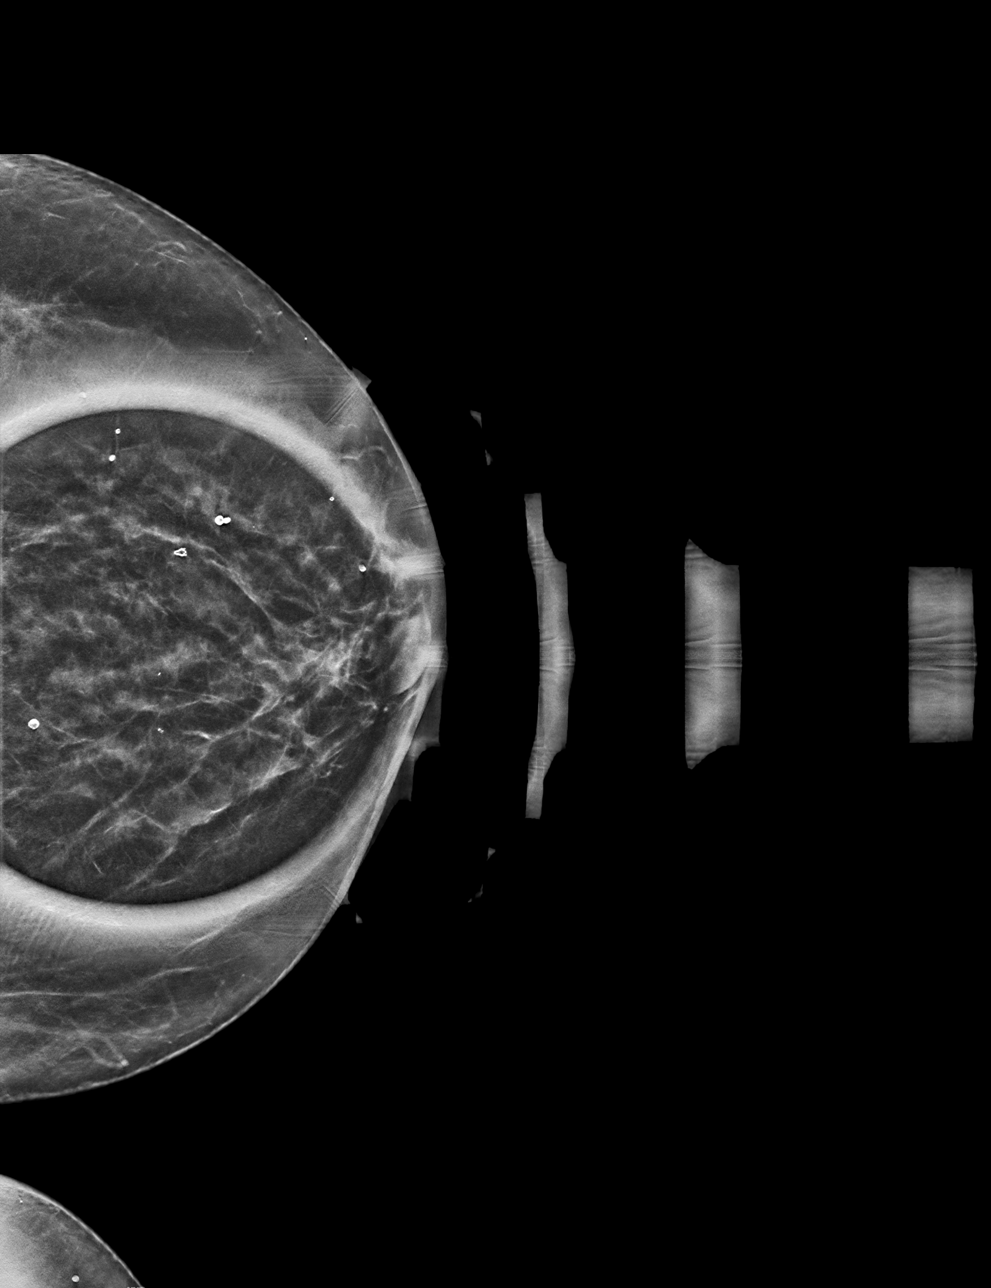

[L ML tomo · tomo slice 35/68.0]
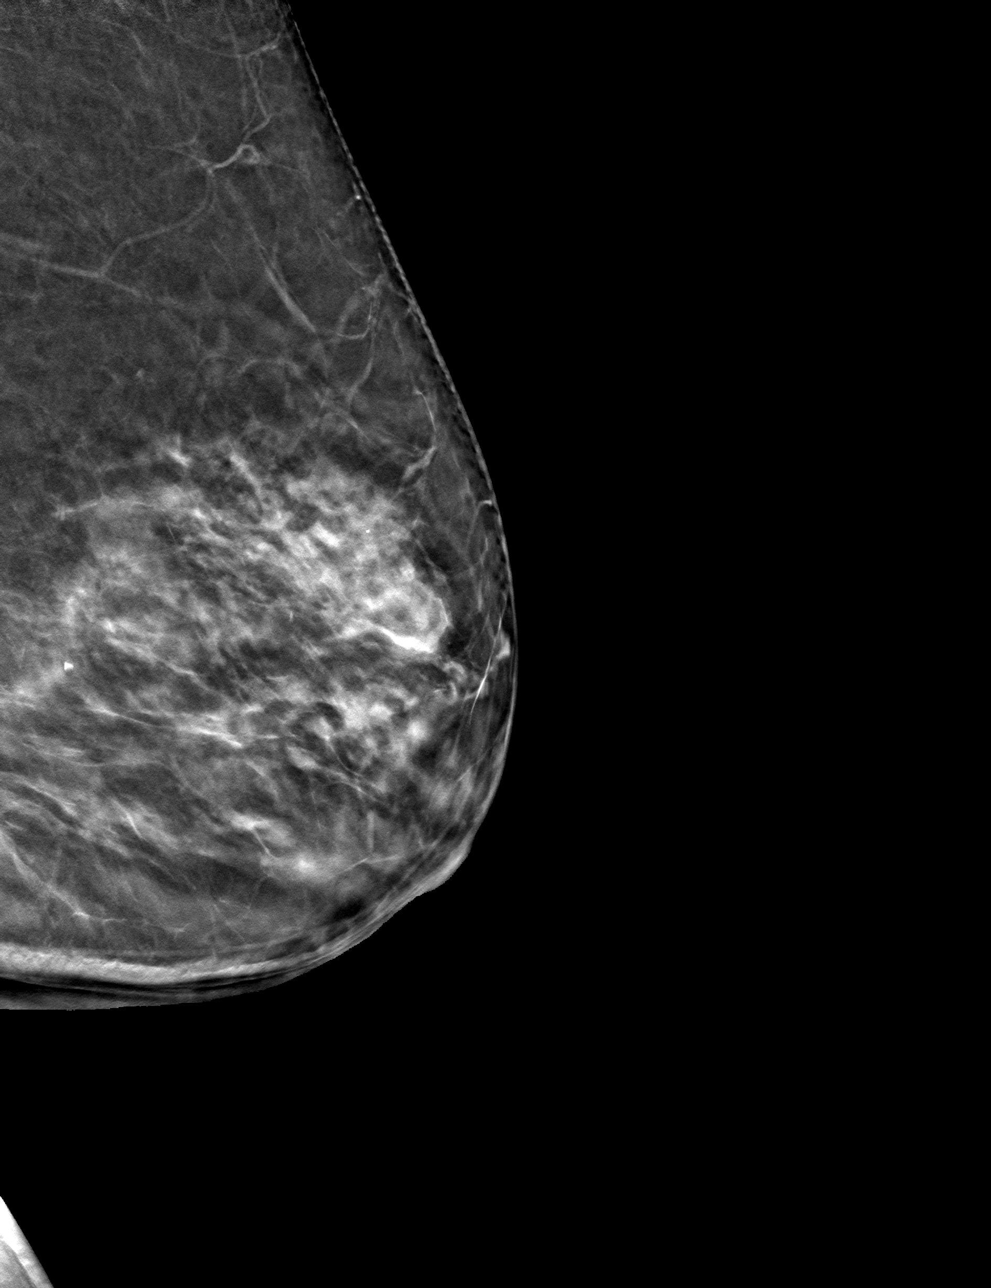

[L CC tomo · tomo slice 25/48.0]
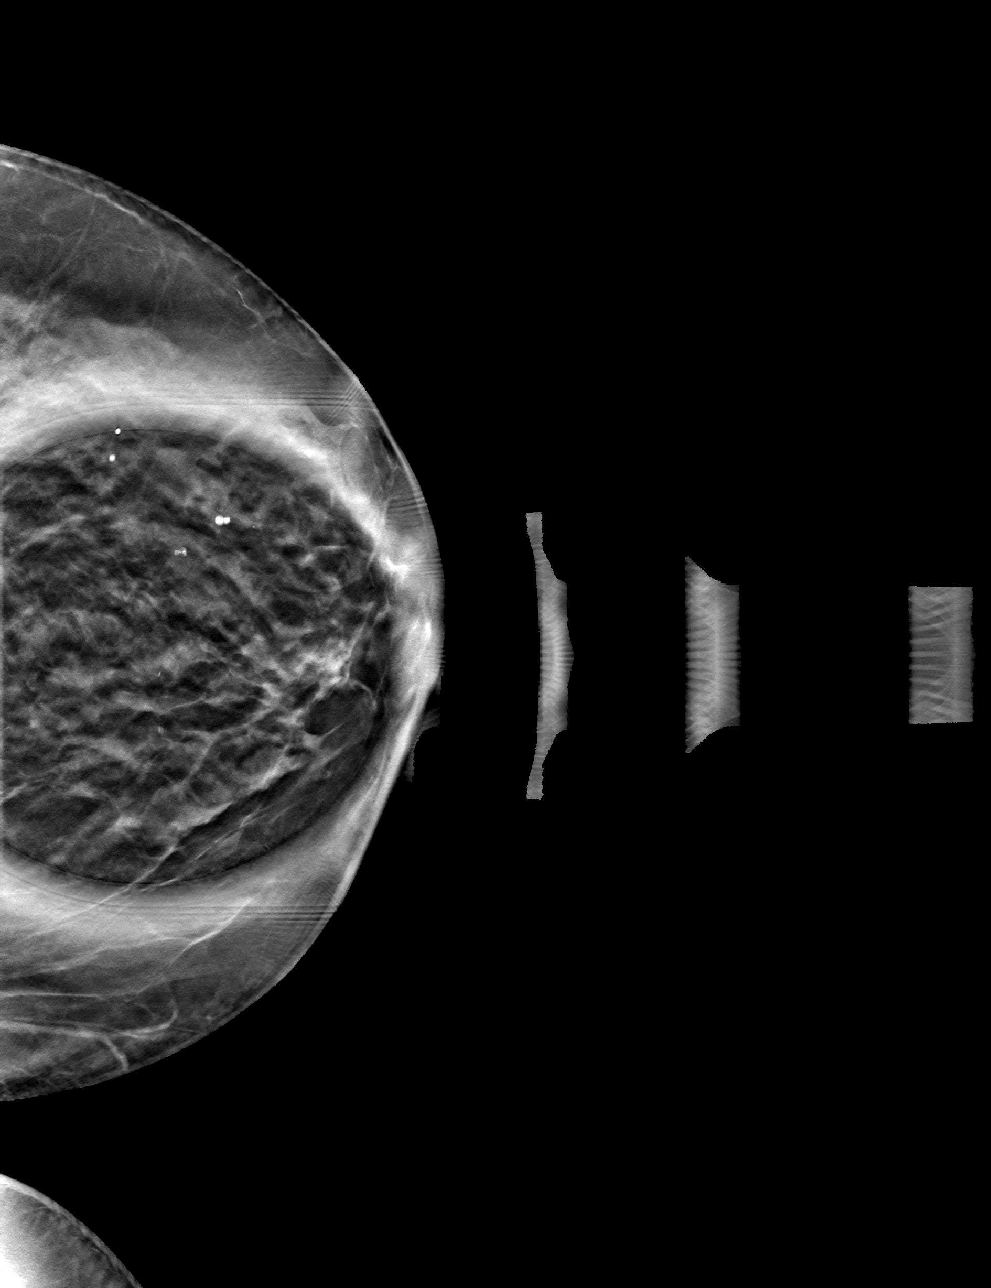

[4 of 12 positions shown; findings below may reference images not displayed]

ACR Breast Density Category c: The breast tissue is heterogeneously
dense, which may obscure small masses.
FINDINGS: Tomosynthesis and synthesized spot compression CC view of the area
of concern and a tomosynthesis and synthesized full field
mediolateral view were obtained.

The asymmetry in the INNER breast at MIDDLE depth questioned on
screening mammography disperses with compression, indicating
overlapping fibroglandular tissue. There is no underlying mass or
architectural distortion.

No findings suspicious for malignancy.
IMPRESSION: No mammographic evidence of malignancy involving the LEFT breast.

RECOMMENDATION:
Screening mammogram in one year.(Code:E5-W-QT6)

I have discussed the findings and recommendations with the patient.
If applicable, a reminder letter will be sent to the patient
regarding the next appointment.

BI-RADS CATEGORY  1: Negative.

## 2021-07-08 ENCOUNTER — Other Ambulatory Visit: Payer: Self-pay

## 2021-07-08 ENCOUNTER — Ambulatory Visit
Admission: RE | Admit: 2021-07-08 | Discharge: 2021-07-08 | Disposition: A | Discharge status: Home / Self Care | Payer: Medicare Other | Source: Ambulatory Visit | Attending: Family Medicine | Admitting: Family Medicine

## 2021-07-08 ENCOUNTER — Other Ambulatory Visit: Payer: Self-pay | Admitting: Family Medicine

## 2021-07-08 DIAGNOSIS — Z1231 Encounter for screening mammogram for malignant neoplasm of breast: Secondary | ICD-10-CM

## 2021-07-08 DIAGNOSIS — Z13820 Encounter for screening for osteoporosis: Secondary | ICD-10-CM

## 2021-07-08 DIAGNOSIS — Z780 Asymptomatic menopausal state: Secondary | ICD-10-CM

## 2021-07-14 ENCOUNTER — Encounter: Payer: Self-pay | Admitting: Gastroenterology

## 2021-07-23 ENCOUNTER — Ambulatory Visit
Admission: RE | Admit: 2021-07-23 | Discharge: 2021-07-23 | Disposition: A | Discharge status: Home / Self Care | Payer: Medicare Other | Source: Ambulatory Visit | Attending: Family Medicine | Admitting: Family Medicine

## 2021-07-23 DIAGNOSIS — Z13820 Encounter for screening for osteoporosis: Secondary | ICD-10-CM

## 2021-07-23 DIAGNOSIS — Z780 Asymptomatic menopausal state: Secondary | ICD-10-CM

## 2021-07-29 ENCOUNTER — Ambulatory Visit (AMBULATORY_SURGERY_CENTER): Payer: Medicare Other | Admitting: *Deleted

## 2021-07-29 ENCOUNTER — Other Ambulatory Visit: Payer: Self-pay

## 2021-07-29 VITALS — Ht 61.0 in | Wt 190.0 lb

## 2021-07-29 DIAGNOSIS — Z86010 Personal history of colonic polyps: Secondary | ICD-10-CM

## 2021-07-29 MED ORDER — PEG 3350-KCL-NA BICARB-NACL 420 G PO SOLR: 4000.0000 mL | Freq: Once | ORAL | 0 refills | Status: AC

## 2021-07-29 NOTE — Progress Notes (Signed)

## 2021-08-10 ENCOUNTER — Encounter: Payer: Self-pay | Admitting: Gastroenterology

## 2021-08-12 ENCOUNTER — Other Ambulatory Visit: Payer: Self-pay

## 2021-08-12 ENCOUNTER — Ambulatory Visit (AMBULATORY_SURGERY_CENTER): Payer: Medicare Other | Admitting: Gastroenterology

## 2021-08-12 ENCOUNTER — Encounter: Payer: Self-pay | Admitting: Gastroenterology

## 2021-08-12 VITALS — BP 114/70 | HR 66 | Temp 96.8°F | Resp 16 | Ht 60.0 in | Wt 190.0 lb

## 2021-08-12 DIAGNOSIS — Z86010 Personal history of colonic polyps: Secondary | ICD-10-CM | POA: Diagnosis present

## 2021-08-12 DIAGNOSIS — D122 Benign neoplasm of ascending colon: Secondary | ICD-10-CM | POA: Diagnosis not present

## 2021-08-12 DIAGNOSIS — D125 Benign neoplasm of sigmoid colon: Secondary | ICD-10-CM | POA: Diagnosis not present

## 2021-08-12 MED ORDER — SODIUM CHLORIDE 0.9 % IV SOLN: 500.0000 mL | Freq: Once | INTRAVENOUS | Status: DC

## 2021-08-12 NOTE — Progress Notes (Signed)
History & Physical  Primary Care Physician:  Margretta Sidle, MD Primary Gastroenterologist: Lucio Edward, MD  CHIEF COMPLAINT:  Personal history of colon polyps   HPI: Joan Vargas is a 66 y.o. female with personal history of adenomatous colon polyps for surveillance colonoscopy.   Past Medical History:  Diagnosis Date   Allergy    Anemia    Anxiety    Arthritis    knees and thumbs    Depression    Diabetes mellitus    diet controlled not on meds    Fibromyalgia    GERD (gastroesophageal reflux disease)    Headache(784.0)    Hyperlipidemia    Hypertension    PONV (postoperative nausea and vomiting)    Restless leg    Thyroid disease     Past Surgical History:  Procedure Laterality Date   ABDOMINAL HYSTERECTOMY  06/14/1984   APPENDECTOMY  06/14/1954   BREAST BIOPSY Left 06/23/2004   CESAREAN SECTION  1984, 1986   KNEE ARTHROSCOPY  06/14/1998   right   OOPHORECTOMY     THYROIDECTOMY  11/04/2011   Procedure: THYROIDECTOMY;  Surgeon: Earnstine Regal, MD;  Location: [facility];  Service: General;  Laterality: N/A;  Total Thyroidectomy   TOTAL KNEE ARTHROPLASTY Right 11/09/2013   Procedure: RIGHT TOTAL KNEE ARTHROPLASTY;  Surgeon: Augustin Schooling, MD;  Location: [facility];  Service: Orthopedics;  Laterality: Right;    Prior to Admission medications   Medication Sig Start Date End Date Taking? Authorizing Provider  aspirin 81 MG tablet Take 81 mg by mouth at bedtime.    Yes [provider]  Continuous Blood Gluc Sensor (FREESTYLE LIBRE 2 SENSOR) MISC APPLY 1 DEVICE EVERY TWO WEEKS FOR CONTINUOUS BLOOD SUGAR MONITORING 06/30/21  Yes [provider]  D2000 ULTRA STRENGTH 50 MCG (2000 UT) CAPS Take 1 capsule by mouth at bedtime. 06/30/21  Yes [provider]  estradiol (ESTRACE) 0.1 MG/GM vaginal cream Place vaginally every Saturday.    Yes [provider]  fluticasone (FLONASE) 50 MCG/ACT nasal spray Place 1 spray into both nostrils at  bedtime.    Yes [provider]  gabapentin (NEURONTIN) 100 MG capsule Take 100 mg by mouth 3 (three) times daily.   Yes [provider]  Glucosamine-Chondroitin (MOVE FREE PO) Take 1 capsule by mouth every morning.   Yes [provider]  HM CRANBERRY 500 MG TABS Take by mouth. 06/30/21  Yes [provider]  Lancet Devices (LANCING DEVICE) [place]  11/23/11  Yes [provider]  Lancets (TGT LANCET [place]) [employer]  11/23/11  Yes [provider]  levothyroxine (SYNTHROID, LEVOTHROID) 175 MCG tablet Take 175 mcg by mouth daily before breakfast.   Yes [provider]  losartan-hydrochlorothiazide (HYZAAR) 100-25 MG per tablet Take 1 tablet by mouth every morning.   Yes [provider]  metFORMIN (GLUCOPHAGE) 1000 MG tablet Take 1,000 mg by mouth 2 (two) times daily with a meal.   Yes [provider]  metoprolol succinate (TOPROL-XL) 50 MG 24 hr tablet Take 50 mg by mouth every morning. Take with or immediately following a meal.   Yes [provider]  montelukast (SINGULAIR) 10 MG tablet Take 10 mg by mouth every morning.   Yes [provider]  Multiple Vitamin (MULTIVITAMIN) tablet Take 1 tablet by mouth every morning.    Yes [provider]  OVER THE COUNTER MEDICATION [employer] Allergy 25 mg. One tablet at night.   Yes [provider]  OVER THE COUNTER MEDICATION Tumeric two tablets one time daily.   Yes [provider]  OVER THE COUNTER MEDICATION Biotin 10, 000 mcg one tablet daily.   Yes [provider]  pramipexole (MIRAPEX) 0.25 MG tablet Take 0.25 mg by mouth at bedtime. 11/26/10  Yes Marletta Lor, MD  tiZANidine (ZANAFLEX) 4 MG tablet Take 4 mg by mouth every 6 (six) hours as needed for muscle spasms.   Yes [provider]  diclofenac sodium (VOLTAREN) 1 % GEL Apply 1 application topically 2 (two) times daily. Pain for thumb and knees Patient not  taking: Reported on 08/12/2021    [provider]  ondansetron (ZOFRAN ODT) 4 MG disintegrating tablet 4mg  ODT q4 hours prn nausea/vomit Patient not taking: Reported on 07/29/2021 11/22/14   Elnora Morrison, MD    Current Outpatient Medications  Medication Sig Dispense Refill   aspirin 81 MG tablet Take 81 mg by mouth at bedtime.      Continuous Blood Gluc Sensor (FREESTYLE LIBRE 2 SENSOR) MISC APPLY 1 DEVICE EVERY TWO WEEKS FOR CONTINUOUS BLOOD SUGAR MONITORING     D2000 ULTRA STRENGTH 50 MCG (2000 UT) CAPS Take 1 capsule by mouth at bedtime.     estradiol (ESTRACE) 0.1 MG/GM vaginal cream Place vaginally every Saturday.      fluticasone (FLONASE) 50 MCG/ACT nasal spray Place 1 spray into both nostrils at bedtime.      gabapentin (NEURONTIN) 100 MG capsule Take 100 mg by mouth 3 (three) times daily.     Glucosamine-Chondroitin (MOVE FREE PO) Take 1 capsule by mouth every morning.     HM CRANBERRY 500 MG TABS Take by mouth.     Lancet Devices (LANCING DEVICE) MISC      Lancets (TGT LANCET ULTRA THIN 30G) MISC      levothyroxine (SYNTHROID, LEVOTHROID) 175 MCG tablet Take 175 mcg by mouth daily before breakfast.     losartan-hydrochlorothiazide (HYZAAR) 100-25 MG per tablet Take 1 tablet by mouth every morning.     metFORMIN (GLUCOPHAGE) 1000 MG tablet Take 1,000 mg by mouth 2 (two) times daily with a meal.     metoprolol succinate (TOPROL-XL) 50 MG 24 hr tablet Take 50 mg by mouth every morning. Take with or immediately following a meal.     montelukast (SINGULAIR) 10 MG tablet Take 10 mg by mouth every morning.     Multiple Vitamin (MULTIVITAMIN) tablet Take 1 tablet by mouth every morning.      OVER THE COUNTER MEDICATION Wal-Dryl Allergy 25 mg. One tablet at night.     OVER THE COUNTER MEDICATION Tumeric two tablets one time daily.     OVER THE COUNTER MEDICATION Biotin 10, 000 mcg one tablet daily.     pramipexole (MIRAPEX) 0.25 MG tablet Take 0.25 mg by mouth at bedtime.      tiZANidine (ZANAFLEX) 4 MG tablet Take 4 mg by mouth every 6 (six) hours as needed for muscle spasms.     diclofenac sodium (VOLTAREN) 1 % GEL Apply 1 application topically 2 (two) times daily. Pain for thumb and knees (Patient not taking: Reported on 08/12/2021)     ondansetron (ZOFRAN ODT) 4 MG disintegrating tablet 4mg  ODT q4 hours prn nausea/vomit (Patient not taking: Reported on 07/29/2021) 8 tablet 0   Current Facility-Administered Medications  Medication Dose Route Frequency Provider Last Rate Last Admin   0.9 %  sodium chloride infusion  500 mL Intravenous Once Ladene Artist, MD  0.9 %  sodium chloride infusion  500 mL Intravenous Once Ladene Artist, MD        Allergies as of 08/12/2021 - Review Complete 08/12/2021  Allergen Reaction Noted   Aspirin Nausea And Vomiting 09/01/2009   Sulfa antibiotics Itching 07/29/2021    Family History  Problem Relation Age of Onset   Diabetes Mother    Colon cancer Neg Hx    Stomach cancer Neg Hx    Esophageal cancer Neg Hx    Rectal cancer Neg Hx    Colon polyps Neg Hx     Social History   Socioeconomic History   Marital status: Married    Spouse name: Not on file   Number of children: Not on file   Years of education: Not on file   Highest education level: Not on file  Occupational History   Not on file  Tobacco Use   Smoking status: Former    Years: 5.00    Types: Cigarettes    Quit date: 06/14/1981    Years since quitting: 40.1   Smokeless tobacco: Never  Vaping Use   Vaping Use: Never used  Substance and Sexual Activity   Alcohol use: No    Comment: occasional   Drug use: No   Sexual activity: Not on file  Other Topics Concern   Not on file  Social History Narrative   Not on file   Social Determinants of Health   Financial Resource Strain: Not on file  Food Insecurity: Not on file  Transportation Needs: Not on file  Physical Activity: Not on file  Stress: Not on file  Social Connections: Not on file   Intimate Partner Violence: Not on file    Review of Systems:  All systems reviewed an negative except where noted in HPI.  Gen: Denies any fever, chills, sweats, anorexia, fatigue, weakness, malaise, weight loss, and sleep disorder CV: Denies chest pain, angina, palpitations, syncope, orthopnea, PND, peripheral edema, and claudication. Resp: Denies dyspnea at rest, dyspnea with exercise, cough, sputum, wheezing, coughing up blood, and pleurisy. GI: Denies vomiting blood, jaundice, and fecal incontinence.   Denies dysphagia or odynophagia. GU : Denies urinary burning, blood in urine, urinary frequency, urinary hesitancy, nocturnal urination, and urinary incontinence. MS: Denies joint pain, limitation of movement, and swelling, stiffness, low back pain, extremity pain. Denies muscle weakness, cramps, atrophy.  Derm: Denies rash, itching, dry skin, hives, moles, warts, or unhealing ulcers.  Psych: Denies depression, anxiety, memory loss, suicidal ideation, hallucinations, paranoia, and confusion. Heme: Denies bruising, bleeding, and enlarged lymph nodes. Neuro:  Denies any headaches, dizziness, paresthesias. Endo:  Denies any problems with DM, thyroid, adrenal function.   Physical Exam: General:  Alert, well-developed, in NAD Head:  Normocephalic and atraumatic. Eyes:  Sclera clear, no icterus.   Conjunctiva pink. Ears:  Normal auditory acuity. Mouth:  No deformity or lesions.  Neck:  Supple; no masses . Lungs:  Clear throughout to auscultation.   No wheezes, crackles, or rhonchi. No acute distress. Heart:  Regular rate and rhythm; no murmurs. Abdomen:  Soft, nondistended, nontender. No masses, hepatomegaly. No obvious masses.  Normal bowel .    Rectal:  Deferred   Msk:  Symmetrical without gross deformities.. Pulses:  Normal pulses noted. Extremities:  Without edema. Neurologic:  Alert and  oriented x4;  grossly normal neurologically. Skin:  Intact without significant lesions or  rashes. Cervical Nodes:  No significant cervical adenopathy. Psych:  Alert and cooperative. Normal mood and affect.  Impression / Plan:   Personal history of adenomatous colon polyps for surveillance colonoscopy.  Pricilla Riffle. Fuller Plan  08/12/2021, 1:34 PM See Shea Evans, [facility], to contact our on call provider

## 2021-08-12 NOTE — Progress Notes (Signed)
Pt's states no medical or surgical changes since previsit or office visit.   VS taken by DT 

## 2021-08-12 NOTE — Patient Instructions (Signed)
Please read handouts provided. Continue present medications. Await pathology results.   YOU HAD AN ENDOSCOPIC PROCEDURE TODAY AT THE [facility]:   Refer to the procedure report that was given to you for any specific questions about what was found during the examination.  If the procedure report does not answer your questions, please call your gastroenterologist to clarify.  If you requested that your care partner not be given the details of your procedure findings, then the procedure report has been included in a sealed envelope for you to review at your convenience later.  YOU SHOULD EXPECT: Some feelings of bloating in the abdomen. Passage of more gas than usual.  Walking can help get rid of the air that was put into your GI tract during the procedure and reduce the bloating. If you had a lower endoscopy (such as a colonoscopy or flexible sigmoidoscopy) you may notice spotting of blood in your stool or on the toilet paper. If you underwent a bowel prep for your procedure, you may not have a normal bowel movement for a few days.  Please Note:  You might notice some irritation and congestion in your nose or some drainage.  This is from the oxygen used during your procedure.  There is no need for concern and it should clear up in a day or so.  SYMPTOMS TO REPORT IMMEDIATELY:  Following lower endoscopy (colonoscopy or flexible sigmoidoscopy):  Excessive amounts of blood in the stool  Significant tenderness or worsening of abdominal pains  Swelling of the abdomen that is new, acute  Fever of 100F or higher   For urgent or emergent issues, a gastroenterologist can be reached at any hour by calling [phone]. Do not use MyChart messaging for urgent concerns.    DIET:  We do recommend a small meal at first, but then you may proceed to your regular diet.  Drink plenty of fluids but you should avoid alcoholic beverages for 24 hours.  ACTIVITY:  You should plan to take it easy  for the rest of today and you should NOT DRIVE or use heavy machinery until tomorrow (because of the sedation medicines used during the test).    FOLLOW UP: Our staff will call the number listed on your records 48-72 hours following your procedure to check on you and address any questions or concerns that you may have regarding the information given to you following your procedure. If we do not reach you, we will leave a message.  We will attempt to reach you two times.  During this call, we will ask if you have developed any symptoms of COVID 19. If you develop any symptoms (ie: fever, flu-like symptoms, shortness of breath, cough etc.) before then, please call [phone].  If you test positive for Covid 19 in the 2 weeks post procedure, please call and report this information to us.    If any biopsies were taken you will be contacted by phone or by letter within the next 1-3 weeks.  Please call us at [phone] if you have not heard about the biopsies in 3 weeks.    SIGNATURES/CONFIDENTIALITY: You and/or your care partner have signed paperwork which will be entered into your electronic medical record.  These signatures attest to the fact that that the information above on your After Visit Summary has been reviewed and is understood.  Full responsibility of the confidentiality of this discharge information lies with you and/or your care-partner.  

## 2021-08-12 NOTE — Op Note (Signed)
[facility] ?Patient Name: Joan Vargas ?Procedure Date: 08/12/2021 1:27 PM ?MRN: [id number] ?Endoscopist: Ladene Artist , MD ?Age: 66 ?Referring MD:  ?Date of Birth: [birth year] ?Gender: Female ?Account #: [id number] ?Procedure:                Colonoscopy ?Indications:              Surveillance: Personal history of adenomatous  ?                          polyps on last colonoscopy 3 years ago ?Medicines:                Monitored Anesthesia Care ?Procedure:                Pre-Anesthesia Assessment: ?                          - Prior to the procedure, a History and Physical  ?                          was performed, and patient medications and  ?                          allergies were reviewed. The patient's tolerance of  ?                          previous anesthesia was also reviewed. The risks  ?                          and benefits of the procedure and the sedation  ?                          options and risks were discussed with the patient.  ?                          All questions were answered, and informed consent  ?                          was obtained. Prior Anticoagulants: The patient has  ?                          taken no previous anticoagulant or antiplatelet  ?                          agents. ASA Grade Assessment: II - A patient with  ?                          mild systemic disease. After reviewing the risks  ?                          and benefits, the patient was deemed in  ?                          satisfactory condition to undergo the procedure. ?  After obtaining informed consent, the colonoscope  ?                          was passed under direct vision. Throughout the  ?                          procedure, the patient's blood pressure, pulse, and  ?                          oxygen saturations were monitored continuously. The  ?                          PCF-HQ190L Colonoscope was introduced through the  ?                          anus and advanced to the the  cecum, identified by  ?                          appendiceal orifice and ileocecal valve. The  ?                          ileocecal valve, appendiceal orifice, and rectum  ?                          were photographed. The quality of the bowel  ?                          preparation was excellent. The colonoscopy was  ?                          performed without difficulty. The patient tolerated  ?                          the procedure well. ?Scope In: 1:40:16 PM ?Scope Out: 1:52:55 PM ?Scope Withdrawal Time: 0 hours 11 minutes 11 seconds  ?Total Procedure Duration: 0 hours 12 minutes 39 seconds  ?Findings:                 The perianal and digital rectal examinations were  ?                          normal. ?                          A 4 mm polyp was found in the ascending colon. The  ?                          polyp was sessile. The polyp was removed with a  ?                          cold biopsy forceps. Resection and retrieval were  ?                          complete. ?  Two sessile polyps were found in the sigmoid colon,  ?                          distal sigmoid colon and ascending colon. The  ?                          polyps were 5 to 7 mm in size. These polyps were  ?                          removed with a cold snare. Resection and retrieval  ?                          were complete. ?                          The exam was otherwise without abnormality on  ?                          direct and retroflexion views. ?Complications:            No immediate complications. Estimated blood loss:  ?                          None. ?Estimated Blood Loss:     Estimated blood loss: none. ?Impression:               - One 4 mm polyp in the ascending colon, removed  ?                          with a cold biopsy forceps. Resected and retrieved. ?                          - Two 5 to 7 mm polyps in the sigmoid colon, in the  ?                          distal sigmoid colon and in the ascending colon,   ?                          removed with a cold snare. Resected and retrieved. ?                          - The examination was otherwise normal on direct  ?                          and retroflexion views. ?Recommendation:           - Repeat colonoscopy after studies are complete for  ?                          surveillance based on pathology results. ?                          - Patient has a contact number available for  ?  emergencies. The signs and symptoms of potential  ?                          delayed complications were discussed with the  ?                          patient. Return to normal activities tomorrow.  ?                          Written discharge instructions were provided to the  ?                          patient. ?                          - Resume previous diet. ?                          - Continue present medications. ?                          - Await pathology results. ?Ladene Artist, MD ?08/12/2021 1:55:44 PM ?This report has been signed electronically. ?

## 2021-08-12 NOTE — Progress Notes (Signed)
Report to PACU, RN, vss, BBS= Clear.  

## 2021-08-12 NOTE — Progress Notes (Signed)
Called to room to assist during endoscopic procedure.  Patient ID and intended procedure confirmed with present staff. Received instructions for my participation in the procedure from the performing physician.  

## 2021-08-14 ENCOUNTER — Telehealth: Payer: Self-pay

## 2021-08-14 NOTE — Telephone Encounter (Signed)
?  Follow up Call- ? ?Call back number 08/12/2021  ?Post procedure Call Back phone  # [phone]  ?Permission to leave phone message Yes  ?Some recent data might be hidden  ?  ? ?Patient questions: ? ?Do you have a fever, pain , or abdominal swelling? No. ?Pain Score  0 * ? ?Have you tolerated food without any problems? Yes.   ? ?Have you been able to return to your normal activities? Yes.   ? ?Do you have any questions about your discharge instructions: ?Diet   No. ?Medications  No. ?Follow up visit  No. ? ?Do you have questions or concerns about your Care? No. ? ?Actions: ?* If pain score is 4 or above: ?No action needed, pain <4. ? ?Have you developed a fever since your procedure? no ? ?2.   Have you had an respiratory symptoms (SOB or cough) since your procedure? no ? ?3.   Have you tested positive for COVID 19 since your procedure no ? ?4.   Have you had any family members/close contacts diagnosed with the COVID 19 since your procedure?  no ? ? ?If yes to any of these questions please route to Joylene John, RN and Joella Prince, RN  ? ? ?

## 2021-08-31 ENCOUNTER — Encounter: Payer: Self-pay | Admitting: Gastroenterology

## 2022-02-03 ENCOUNTER — Other Ambulatory Visit: Payer: Self-pay | Admitting: Ophthalmology

## 2022-04-14 DIAGNOSIS — M25611 Stiffness of right shoulder, not elsewhere classified: Secondary | ICD-10-CM | POA: Diagnosis not present

## 2022-04-14 DIAGNOSIS — M75121 Complete rotator cuff tear or rupture of right shoulder, not specified as traumatic: Secondary | ICD-10-CM | POA: Diagnosis not present

## 2022-04-19 DIAGNOSIS — M25611 Stiffness of right shoulder, not elsewhere classified: Secondary | ICD-10-CM | POA: Diagnosis not present

## 2022-04-19 DIAGNOSIS — M75121 Complete rotator cuff tear or rupture of right shoulder, not specified as traumatic: Secondary | ICD-10-CM | POA: Diagnosis not present

## 2022-04-20 DIAGNOSIS — H524 Presbyopia: Secondary | ICD-10-CM | POA: Diagnosis not present

## 2022-04-20 DIAGNOSIS — H2513 Age-related nuclear cataract, bilateral: Secondary | ICD-10-CM | POA: Diagnosis not present

## 2022-04-20 DIAGNOSIS — H04123 Dry eye syndrome of bilateral lacrimal glands: Secondary | ICD-10-CM | POA: Diagnosis not present

## 2022-04-20 DIAGNOSIS — E119 Type 2 diabetes mellitus without complications: Secondary | ICD-10-CM | POA: Diagnosis not present

## 2022-04-22 DIAGNOSIS — M25611 Stiffness of right shoulder, not elsewhere classified: Secondary | ICD-10-CM | POA: Diagnosis not present

## 2022-04-22 DIAGNOSIS — M75121 Complete rotator cuff tear or rupture of right shoulder, not specified as traumatic: Secondary | ICD-10-CM | POA: Diagnosis not present

## 2022-04-27 DIAGNOSIS — M75121 Complete rotator cuff tear or rupture of right shoulder, not specified as traumatic: Secondary | ICD-10-CM | POA: Diagnosis not present

## 2022-04-27 DIAGNOSIS — M25611 Stiffness of right shoulder, not elsewhere classified: Secondary | ICD-10-CM | POA: Diagnosis not present

## 2022-04-30 DIAGNOSIS — M75121 Complete rotator cuff tear or rupture of right shoulder, not specified as traumatic: Secondary | ICD-10-CM | POA: Diagnosis not present

## 2022-04-30 DIAGNOSIS — M25611 Stiffness of right shoulder, not elsewhere classified: Secondary | ICD-10-CM | POA: Diagnosis not present

## 2022-05-03 DIAGNOSIS — M75121 Complete rotator cuff tear or rupture of right shoulder, not specified as traumatic: Secondary | ICD-10-CM | POA: Diagnosis not present

## 2022-05-03 DIAGNOSIS — M25611 Stiffness of right shoulder, not elsewhere classified: Secondary | ICD-10-CM | POA: Diagnosis not present

## 2022-05-05 DIAGNOSIS — M25611 Stiffness of right shoulder, not elsewhere classified: Secondary | ICD-10-CM | POA: Diagnosis not present

## 2022-05-05 DIAGNOSIS — M75121 Complete rotator cuff tear or rupture of right shoulder, not specified as traumatic: Secondary | ICD-10-CM | POA: Diagnosis not present

## 2022-05-13 DIAGNOSIS — M75121 Complete rotator cuff tear or rupture of right shoulder, not specified as traumatic: Secondary | ICD-10-CM | POA: Diagnosis not present

## 2022-05-13 DIAGNOSIS — M25611 Stiffness of right shoulder, not elsewhere classified: Secondary | ICD-10-CM | POA: Diagnosis not present

## 2022-05-17 DIAGNOSIS — M75121 Complete rotator cuff tear or rupture of right shoulder, not specified as traumatic: Secondary | ICD-10-CM | POA: Diagnosis not present

## 2022-05-17 DIAGNOSIS — M25611 Stiffness of right shoulder, not elsewhere classified: Secondary | ICD-10-CM | POA: Diagnosis not present

## 2022-05-24 DIAGNOSIS — M75121 Complete rotator cuff tear or rupture of right shoulder, not specified as traumatic: Secondary | ICD-10-CM | POA: Diagnosis not present

## 2022-05-24 DIAGNOSIS — M25611 Stiffness of right shoulder, not elsewhere classified: Secondary | ICD-10-CM | POA: Diagnosis not present

## 2022-05-27 DIAGNOSIS — M25611 Stiffness of right shoulder, not elsewhere classified: Secondary | ICD-10-CM | POA: Diagnosis not present

## 2022-05-27 DIAGNOSIS — M75121 Complete rotator cuff tear or rupture of right shoulder, not specified as traumatic: Secondary | ICD-10-CM | POA: Diagnosis not present

## 2022-06-01 ENCOUNTER — Other Ambulatory Visit: Payer: Self-pay | Admitting: Geriatric Medicine

## 2022-06-01 DIAGNOSIS — Z1231 Encounter for screening mammogram for malignant neoplasm of breast: Secondary | ICD-10-CM

## 2022-06-01 DIAGNOSIS — M25611 Stiffness of right shoulder, not elsewhere classified: Secondary | ICD-10-CM | POA: Diagnosis not present

## 2022-06-01 DIAGNOSIS — M75121 Complete rotator cuff tear or rupture of right shoulder, not specified as traumatic: Secondary | ICD-10-CM | POA: Diagnosis not present

## 2022-06-04 DIAGNOSIS — M75121 Complete rotator cuff tear or rupture of right shoulder, not specified as traumatic: Secondary | ICD-10-CM | POA: Diagnosis not present

## 2022-06-04 DIAGNOSIS — M25611 Stiffness of right shoulder, not elsewhere classified: Secondary | ICD-10-CM | POA: Diagnosis not present

## 2022-06-08 DIAGNOSIS — M75121 Complete rotator cuff tear or rupture of right shoulder, not specified as traumatic: Secondary | ICD-10-CM | POA: Diagnosis not present

## 2022-06-08 DIAGNOSIS — M25611 Stiffness of right shoulder, not elsewhere classified: Secondary | ICD-10-CM | POA: Diagnosis not present

## 2022-06-10 DIAGNOSIS — M75121 Complete rotator cuff tear or rupture of right shoulder, not specified as traumatic: Secondary | ICD-10-CM | POA: Diagnosis not present

## 2022-06-10 DIAGNOSIS — M25611 Stiffness of right shoulder, not elsewhere classified: Secondary | ICD-10-CM | POA: Diagnosis not present

## 2022-06-16 DIAGNOSIS — M25611 Stiffness of right shoulder, not elsewhere classified: Secondary | ICD-10-CM | POA: Diagnosis not present

## 2022-06-16 DIAGNOSIS — M75121 Complete rotator cuff tear or rupture of right shoulder, not specified as traumatic: Secondary | ICD-10-CM | POA: Diagnosis not present

## 2022-06-23 DIAGNOSIS — M75121 Complete rotator cuff tear or rupture of right shoulder, not specified as traumatic: Secondary | ICD-10-CM | POA: Diagnosis not present

## 2022-06-23 DIAGNOSIS — M25611 Stiffness of right shoulder, not elsewhere classified: Secondary | ICD-10-CM | POA: Diagnosis not present

## 2022-07-01 DIAGNOSIS — M25611 Stiffness of right shoulder, not elsewhere classified: Secondary | ICD-10-CM | POA: Diagnosis not present

## 2022-07-01 DIAGNOSIS — M75121 Complete rotator cuff tear or rupture of right shoulder, not specified as traumatic: Secondary | ICD-10-CM | POA: Diagnosis not present

## 2022-07-05 DIAGNOSIS — M75121 Complete rotator cuff tear or rupture of right shoulder, not specified as traumatic: Secondary | ICD-10-CM | POA: Diagnosis not present

## 2022-07-05 DIAGNOSIS — M25611 Stiffness of right shoulder, not elsewhere classified: Secondary | ICD-10-CM | POA: Diagnosis not present

## 2022-07-05 DIAGNOSIS — S46011A Strain of muscle(s) and tendon(s) of the rotator cuff of right shoulder, initial encounter: Secondary | ICD-10-CM | POA: Diagnosis not present

## 2022-07-27 ENCOUNTER — Ambulatory Visit
Admission: RE | Admit: 2022-07-27 | Discharge: 2022-07-27 | Disposition: A | Discharge status: Home / Self Care | Payer: Medicare Other | Source: Ambulatory Visit | Attending: Geriatric Medicine | Admitting: Geriatric Medicine

## 2022-07-27 DIAGNOSIS — Z1231 Encounter for screening mammogram for malignant neoplasm of breast: Secondary | ICD-10-CM | POA: Diagnosis not present

## 2022-08-06 DIAGNOSIS — E1169 Type 2 diabetes mellitus with other specified complication: Secondary | ICD-10-CM | POA: Diagnosis not present

## 2022-08-16 DIAGNOSIS — R102 Pelvic and perineal pain: Secondary | ICD-10-CM | POA: Diagnosis not present

## 2022-08-16 DIAGNOSIS — N763 Subacute and chronic vulvitis: Secondary | ICD-10-CM | POA: Diagnosis not present

## 2022-08-17 DIAGNOSIS — M62830 Muscle spasm of back: Secondary | ICD-10-CM | POA: Diagnosis not present

## 2022-08-17 DIAGNOSIS — E1140 Type 2 diabetes mellitus with diabetic neuropathy, unspecified: Secondary | ICD-10-CM | POA: Diagnosis not present

## 2022-08-17 DIAGNOSIS — Z7984 Long term (current) use of oral hypoglycemic drugs: Secondary | ICD-10-CM | POA: Diagnosis not present

## 2022-08-17 DIAGNOSIS — N3281 Overactive bladder: Secondary | ICD-10-CM | POA: Diagnosis not present

## 2022-09-15 DIAGNOSIS — N763 Subacute and chronic vulvitis: Secondary | ICD-10-CM | POA: Diagnosis not present

## 2022-09-15 DIAGNOSIS — R102 Pelvic and perineal pain: Secondary | ICD-10-CM | POA: Diagnosis not present

## 2022-09-21 ENCOUNTER — Ambulatory Visit: Payer: Medicare Other | Admitting: Dermatology

## 2022-09-21 ENCOUNTER — Encounter: Payer: Self-pay | Admitting: Dermatology

## 2022-09-21 VITALS — BP 105/68 | HR 81

## 2022-09-21 DIAGNOSIS — D492 Neoplasm of unspecified behavior of bone, soft tissue, and skin: Secondary | ICD-10-CM

## 2022-09-21 DIAGNOSIS — Z1283 Encounter for screening for malignant neoplasm of skin: Secondary | ICD-10-CM

## 2022-09-21 DIAGNOSIS — L720 Epidermal cyst: Secondary | ICD-10-CM

## 2022-09-21 DIAGNOSIS — L814 Other melanin hyperpigmentation: Secondary | ICD-10-CM

## 2022-09-21 DIAGNOSIS — D2372 Other benign neoplasm of skin of left lower limb, including hip: Secondary | ICD-10-CM

## 2022-09-21 DIAGNOSIS — D235 Other benign neoplasm of skin of trunk: Secondary | ICD-10-CM | POA: Diagnosis not present

## 2022-09-21 DIAGNOSIS — L900 Lichen sclerosus et atrophicus: Secondary | ICD-10-CM | POA: Diagnosis not present

## 2022-09-21 DIAGNOSIS — L821 Other seborrheic keratosis: Secondary | ICD-10-CM

## 2022-09-21 DIAGNOSIS — X32XXXA Exposure to sunlight, initial encounter: Secondary | ICD-10-CM

## 2022-09-21 DIAGNOSIS — W908XXA Exposure to other nonionizing radiation, initial encounter: Secondary | ICD-10-CM

## 2022-09-21 DIAGNOSIS — D1801 Hemangioma of skin and subcutaneous tissue: Secondary | ICD-10-CM

## 2022-09-21 DIAGNOSIS — D225 Melanocytic nevi of trunk: Secondary | ICD-10-CM | POA: Diagnosis not present

## 2022-09-21 DIAGNOSIS — L578 Other skin changes due to chronic exposure to nonionizing radiation: Secondary | ICD-10-CM

## 2022-09-21 MED ORDER — CLOBETASOL PROPIONATE 0.05 % EX CREA: TOPICAL_CREAM | CUTANEOUS | 2 refills | Status: DC

## 2022-09-21 MED ORDER — TACROLIMUS 0.1 % EX OINT: TOPICAL_OINTMENT | CUTANEOUS | 2 refills | Status: DC

## 2022-09-21 NOTE — Patient Instructions (Addendum)
Vaginal area Apply tacrolimus twice a day to affected areas for 2 weeks, then use Clobetasol cream twice daily for 2 weeks. Continue alternating medications every 2 weeks until next visit.   Patient Handout: Wound Care for Skin Biopsy Site  Patient Handout: Wound Care for Skin Biopsy Site  Taking Care of Your Skin Biopsy Site  Proper care of the biopsy site is essential for promoting healing and minimizing scarring. This handout provides instructions on how to care for your biopsy site to ensure optimal recovery.  1. Cleaning the Wound:  Clean the biopsy site daily with gentle soap and water. Gently pat the area dry with a clean, soft towel. Avoid harsh scrubbing or rubbing the area, as this can irritate the skin and delay healing.  2. Applying Aquaphor and Bandage:  After cleaning the wound, apply a thin layer of Aquaphor ointment to the biopsy site. Cover the area with a sterile bandage to protect it from dirt, bacteria, and friction. Change the bandage daily or as needed if it becomes soiled or wet.  3. Continued Care for One Week:  Repeat the cleaning, Aquaphor application, and bandaging process daily for one week following the biopsy procedure. Keeping the wound clean and moist during this initial healing period will help prevent infection and promote optimal healing.  4. Massaging Aquaphor into the Area:  ---After one week, discontinue the use of bandages but continue to apply Aquaphor to the biopsy site. ----Gently massage the Aquaphor into the area using circular motions. ---Massaging the skin helps to promote circulation and prevent the formation of scar tissue.   Additional Tips:  Avoid exposing the biopsy site to direct sunlight during the healing process, as this can cause hyperpigmentation or worsen scarring. If you experience any signs of infection, such as increased redness, swelling, warmth, or drainage from the wound, contact your healthcare provider  immediately. Follow any additional instructions provided by your healthcare provider for caring for the biopsy site and managing any discomfort. Conclusion:  Taking proper care of your skin biopsy site is crucial for ensuring optimal healing and minimizing scarring. By following these instructions for cleaning, applying Aquaphor, and massaging the area, you can promote a smooth and successful recovery. If you have any questions or concerns about caring for your biopsy site, don't hesitate to contact your healthcare provider for guidance.       Skin Education :   I counseled the patient regarding the following: Sun screen (SPF 30 or greater) should be applied during peak UV exposure (between 10am and 2pm) and reapplied after exercise or swimming.  The ABCDEs of melanoma were reviewed with the patient, and the importance of monthly self-examination of moles was emphasized. Should any moles change in shape or color, or itch, bleed or burn, pt will contact our office for evaluation sooner then their interval appointment.  Plan: Sunscreen Recommendations I recommended a broad spectrum sunscreen with a SPF of 30 or higher. I explained that SPF 30 sunscreens block approximately 97 percent of the sun's harmful rays. Sunscreens should be applied at least 15 minutes prior to expected sun exposure and then every 2 hours after that as long as sun exposure continues. If swimming or exercising sunscreen should be reapplied every 45 minutes to an hour after getting wet or sweating. One ounce, or the equivalent of a shot glass full of sunscreen, is adequate to protect the skin not covered by a bathing suit. I also recommended a lip balm with a sunscreen as well.  Sun protective clothing can be used in lieu of sunscreen but must be worn the entire time you are exposed to the sun's rays.  Due to recent changes in healthcare laws, you may see results of your pathology and/or laboratory studies on MyChart before the  doctors have had a chance to review them. We understand that in some cases there may be results that are confusing or concerning to you. Please understand that not all results are received at the same time and often the doctors may need to interpret multiple results in order to provide you with the best plan of care or course of treatment. Therefore, we ask that you please give [place] 2 business days to thoroughly review all your results before contacting the office for clarification. Should we see a critical lab result, you will be contacted sooner.   If You Need Anything After Your Visit  If you have any questions or concerns for your doctor, please call our main line at [phone] If no one answers, please leave a voicemail as directed and we will return your call as soon as possible. Messages left after 4 pm will be answered the following business day.   You may also send [place] a message via MyChart. We typically respond to MyChart messages within 1-2 business days.  For prescription refills, please ask your pharmacy to contact our office. Our fax number is [phone].  If you have an urgent issue when the clinic is closed that cannot wait until the next business day, you can page your doctor at the number below.    Please note that while we do our best to be available for urgent issues outside of office hours, we are not available 24/7.   If you have an urgent issue and are unable to reach [place], you may choose to seek medical care at your doctor's office, retail clinic, urgent care center, or emergency room.  If you have a medical emergency, please immediately call 911 or go to the emergency department. In the event of inclement weather, please call our main line at [phone] for an update on the status of any delays or closures.  Dermatology Medication Tips: Please keep the boxes that topical medications come in in order to help keep track of the instructions about where and how to use  these. Pharmacies typically print the medication instructions only on the boxes and not directly on the medication tubes.   If your medication is too expensive, please contact our office at [phone] or send [place] a message through MyChart.   We are unable to tell what your co-pay for medications will be in advance as this is different depending on your insurance coverage. However, we may be able to find a substitute medication at lower cost or fill out paperwork to get insurance to cover a needed medication.   If a prior authorization is required to get your medication covered by your insurance company, please allow [place] 1-2 business days to complete this process.  Drug prices often vary depending on where the prescription is filled and some pharmacies may offer cheaper prices.  The website www.goodrx.com contains coupons for medications through different pharmacies. The prices here do not account for what the cost may be with help from insurance (it may be cheaper with your insurance), but the website can give you the price if you did not use any insurance.  - You can print the associated coupon and take it with your prescription to the pharmacy.  -  You may also stop by our office during regular business hours and pick up a GoodRx coupon card.  - If you need your prescription sent electronically to a different pharmacy, notify our office through [facility] or by phone at [phone]

## 2022-09-21 NOTE — Progress Notes (Signed)
New Patient Visit   Subjective  Joan Vargas is a 67 y.o. female who presents for the following: Skin Cancer Screening and Full Body Skin Exam. No personal Hx of skin cancer. Has had things removed at Dr. Dorita Sciara office years ago that were not cancerous.  Has issues going on in groin area. Has been using Clobetasol cream in vaginal area every other day. Prescribed by PA at urologist office.  The patient presents for Total-Body Skin Exam (TBSE) for skin cancer screening and mole check. The patient has spots, moles and lesions to be evaluated, some may be new or changing and the patient has concerns that these could be cancer.    The following portions of the chart were reviewed this encounter and updated as appropriate: medications, allergies, medical history  Review of Systems:  No other skin or systemic complaints except as noted in HPI or Assessment and Plan.  Objective  Well appearing patient in no apparent distress; mood and affect are within normal limits.  A full examination was performed including scalp, head, eyes, ears, nose, lips, neck, chest, axillae, abdomen, back, buttocks, bilateral upper extremities, bilateral lower extremities, hands, feet, fingers, toes, fingernails, and toenails. All findings within normal limits unless otherwise noted below.   Relevant physical exam findings are noted in the Assessment and Plan.  Left Lower Back 5.5 mm irregular brown and black macule       Assessment & Plan   LENTIGINES, SEBORRHEIC KERATOSES, HEMANGIOMAS - Benign normal skin lesions - Benign-appearing - Call for any changes  MELANOCYTIC NEVI - Tan-brown and/or pink-flesh-colored symmetric macules and papules - Benign appearing on exam today - Observation - Call clinic for new or changing moles - Recommend daily use of broad spectrum spf 30+ sunscreen to sun-exposed areas.   ACTINIC DAMAGE - Chronic condition, secondary to cumulative UV/sun exposure - diffuse scaly  erythematous macules with underlying dyspigmentation - Recommend daily broad spectrum sunscreen SPF 30+ to sun-exposed areas, reapply every 2 hours as needed.  - Staying in the shade or wearing long sleeves, sun glasses (UVA+UVB protection) and wide brim hats (4-inch brim around the entire circumference of the hat) are also recommended for sun protection.  - Call for new or changing lesions.  SKIN CANCER SCREENING PERFORMED TODAY.   LICHEN SCLEROSUS ET ATROPHICUS Exam: erythematous and white patches +/- erosions at   Chronic and persistent condition with duration or expected duration over one year. Condition is symptomatic/ bothersome to patient. Not currently at goal.   Lichen sclerosus is a chronic inflammatory condition of unknown cause that frequently involves the vaginal area and less commonly extragenital skin, and is NOT sexually transmitted. It frequently causes symptoms of pain and burning.  It requires regular monitoring and treatment with topical steroids to minimize inflammation and to reduce risk of scarring. There is also a risk of cancer in the vaginal area which is very low if inflammation is well controlled. Regular checks of the area are recommended. Please call if you notice any new or changing spots within this area.  Treatment Plan: Apply tacrolimus twice a day to affected areas for 2 weeks, then use Clobetasol cream twice daily for 2 weeks. Continue alternating medications every 2 weeks until next visit.      Neoplasm of skin Left Lower Back  Skin / nail biopsy Type of biopsy: tangential   Informed consent: discussed and consent obtained   Timeout: patient name, date of birth, surgical site, and procedure verified   Procedure  prep:  Patient was prepped and draped in usual sterile fashion Prep type:  Isopropyl alcohol Anesthesia: the lesion was anesthetized in a standard fashion   Anesthetic:  1% lidocaine w/ epinephrine 1-100,000 buffered w/ 8.4%  NaHCO3 Instrument used: flexible razor blade   Hemostasis achieved with: pressure, aluminum chloride and electrodesiccation   Outcome: patient tolerated procedure well   Post-procedure details: sterile dressing applied and wound care instructions given   Dressing type: bandage and petrolatum    Specimen 1 - Surgical pathology Differential Diagnosis: R/O dysplastic nevus  Check Margins: No   EPIDERMAL INCLUSION CYST Exam: Soft subcutaneous nodule at left posterior shoulder.   Benign-appearing. Exam most consistent with an epidermal inclusion cyst. Discussed that a cyst is a benign growth that can grow over time and sometimes get irritated or inflamed. Recommend observation if it is not bothersome. Discussed option of surgical excision to remove it if it is growing, symptomatic, or other changes noted. Please call for new or changing lesions so they can be evaluated.  DERMATOFIBROMA Exam: Firm pink/brown papulenodule with dimple sign at left thigh. Treatment Plan: Benign-appearing.  Observation.  Call clinic for new or changing lesions.      Return in about 1 year (around 09/21/2023) for TBSE, LSetA Follow Up in 3 months.  I, Lawson Radar, CMA, am acting as scribe for Langston Reusing, MD.   Documentation: I have reviewed the above documentation for accuracy and completeness, and I agree with the above.  Langston Reusing, MD

## 2022-09-27 NOTE — Progress Notes (Signed)
Hi Joan Vargas,  Dr. Onalee Hua reviewed your biopsy results and they showed the spot removed was a little "abnormal" but not cancerous.  No additional treatment is required.  We will continue to monitor the area for re-pigmentation during your annual skin exams. The detailed report is available to view in MyChart.  Have a great day!  Kind Regards,  Dr. Kermit Balo Care Team

## 2022-10-05 DIAGNOSIS — Z7984 Long term (current) use of oral hypoglycemic drugs: Secondary | ICD-10-CM | POA: Diagnosis not present

## 2022-10-05 DIAGNOSIS — Z7985 Long-term (current) use of injectable non-insulin antidiabetic drugs: Secondary | ICD-10-CM | POA: Diagnosis not present

## 2022-10-05 DIAGNOSIS — E1140 Type 2 diabetes mellitus with diabetic neuropathy, unspecified: Secondary | ICD-10-CM | POA: Diagnosis not present

## 2022-11-29 DIAGNOSIS — M1990 Unspecified osteoarthritis, unspecified site: Secondary | ICD-10-CM | POA: Diagnosis not present

## 2022-11-29 DIAGNOSIS — G2581 Restless legs syndrome: Secondary | ICD-10-CM | POA: Diagnosis not present

## 2022-12-03 DIAGNOSIS — Z7985 Long-term (current) use of injectable non-insulin antidiabetic drugs: Secondary | ICD-10-CM | POA: Diagnosis not present

## 2022-12-03 DIAGNOSIS — Z7984 Long term (current) use of oral hypoglycemic drugs: Secondary | ICD-10-CM | POA: Diagnosis not present

## 2022-12-03 DIAGNOSIS — E1169 Type 2 diabetes mellitus with other specified complication: Secondary | ICD-10-CM | POA: Diagnosis not present

## 2022-12-03 DIAGNOSIS — E119 Type 2 diabetes mellitus without complications: Secondary | ICD-10-CM | POA: Diagnosis not present

## 2022-12-03 DIAGNOSIS — Z9189 Other specified personal risk factors, not elsewhere classified: Secondary | ICD-10-CM | POA: Diagnosis not present

## 2022-12-21 ENCOUNTER — Ambulatory Visit: Payer: Medicare Other | Admitting: Dermatology

## 2022-12-23 ENCOUNTER — Ambulatory Visit (INDEPENDENT_AMBULATORY_CARE_PROVIDER_SITE_OTHER): Payer: Medicare Other | Admitting: Dermatology

## 2022-12-23 DIAGNOSIS — L900 Lichen sclerosus et atrophicus: Secondary | ICD-10-CM

## 2022-12-23 NOTE — Patient Instructions (Addendum)
Thank you for visiting [place] again and for your commitment to improving your health. It was good to see you and discuss the progress and adjustments needed in your treatment plan. Here's a summary of the key instructions we discussed:  - Medication Routine: Continue using tacrolimus and clobetasol. Apply a thin layer of tacrolimus for two weeks, followed by two weeks of clobetasol. - Additional Products:   - Desitin Cream: Apply over the medication to create a barrier and help with the burning sensation. Use possibly three times a day (morning, afternoon, evening).   - Application Tips: Consider using disposable gloves for application to keep things clean and prevent mess. A pad may be used to protect underwear, though it's not necessary.  - Clothing: Wear cotton underwear to help manage any mess from the creams and ointments.  - Follow-Up: Let's give this routine a good two months to see how it works for you. We will assess your progress after this period.  Please feel free to reach out if you have any questions or concerns in the meantime. Looking forward to seeing how you improve with these adjustments   Due to recent changes in healthcare laws, you may see results of your pathology and/or laboratory studies on MyChart before the doctors have had a chance to review them. We understand that in some cases there may be results that are confusing or concerning to you. Please understand that not all results are received at the same time and often the doctors may need to interpret multiple results in order to provide you with the best plan of care or course of treatment. Therefore, we ask that you please give [place] 2 business days to thoroughly review all your results before contacting the office for clarification. Should we see a critical lab result, you will be contacted sooner.   If You Need Anything After Your Visit  If you have any questions or concerns for your doctor, please call our main line at  [phone] If no one answers, please leave a voicemail as directed and we will return your call as soon as possible. Messages left after 4 pm will be answered the following business day.   You may also send [place] a message via MyChart. We typically respond to MyChart messages within 1-2 business days.  For prescription refills, please ask your pharmacy to contact our office. Our fax number is [phone].  If you have an urgent issue when the clinic is closed that cannot wait until the next business day, you can page your doctor at the number below.    Please note that while we do our best to be available for urgent issues outside of office hours, we are not available 24/7.   If you have an urgent issue and are unable to reach [place], you may choose to seek medical care at your doctor's office, retail clinic, urgent care center, or emergency room.  If you have a medical emergency, please immediately call 911 or go to the emergency department. In the event of inclement weather, please call our main line at [phone] for an update on the status of any delays or closures.  Dermatology Medication Tips: Please keep the boxes that topical medications come in in order to help keep track of the instructions about where and how to use these. Pharmacies typically print the medication instructions only on the boxes and not directly on the medication tubes.   If your medication is too expensive, please contact our office at  [phone] or send [place] a message through MyChart.   We are unable to tell what your co-pay for medications will be in advance as this is different depending on your insurance coverage. However, we may be able to find a substitute medication at lower cost or fill out paperwork to get insurance to cover a needed medication.   If a prior authorization is required to get your medication covered by your insurance company, please allow [place] 1-2 business days to complete this process.  Drug  prices often vary depending on where the prescription is filled and some pharmacies may offer cheaper prices.  The website www.goodrx.com contains coupons for medications through different pharmacies. The prices here do not account for what the cost may be with help from insurance (it may be cheaper with your insurance), but the website can give you the price if you did not use any insurance.  - You can print the associated coupon and take it with your prescription to the pharmacy.  - You may also stop by our office during regular business hours and pick up a GoodRx coupon card.  - If you need your prescription sent electronically to a different pharmacy, notify our office through [facility] or by phone at [phone]

## 2022-12-23 NOTE — Progress Notes (Signed)
   Follow-Up Visit   Subjective  Joan Vargas is a 67 y.o. female who presents for the following: She is here for a follow up of Lichen Sclerosus. She is treating with Tacrolimus alternating every 2 weeks with Clobetasol. She does not think the creams are working. She is still having a burning sensation, especially after she uses the bathroom. She does use estradiol cream but they just decreased her use of that last week. She says area was biopsied about a year ago by her   The following portions of the chart were reviewed this encounter and updated as appropriate: medications, allergies, medical history  Review of Systems:  No other skin or systemic complaints except as noted in HPI or Assessment and Plan.  Objective  Well appearing patient in no apparent distress; mood and affect are within normal limits.   A focused examination was performed of the following areas: Vaginal area  Relevant exam findings are noted in the Assessment and Plan.    Assessment & Plan   Lichen Sclerosus et Atrophicus (Improved but still flares) Exam: White atrophic plaques involving the mucosal surface of the right labia majora  Treatment Plan: Continue alternating Tacrolimus and Clobetasol daily every 2 weeks  -Pt complains of irritation mostly when she's urinating so we are recommending better barrier care  Start Desitin cream  - every day-tid, apply after applying prescription medications.     Return in about 2 months (around 02/23/2023) for Follow up.  I, Joanie Coddington, CMA, am acting as scribe for [employer], DO .   Documentation: I have reviewed the above documentation for accuracy and completeness, and I agree with the above.  Langston Reusing, DO

## 2022-12-29 ENCOUNTER — Encounter: Payer: Self-pay | Admitting: Dermatology

## 2023-02-07 DIAGNOSIS — N182 Chronic kidney disease, stage 2 (mild): Secondary | ICD-10-CM | POA: Diagnosis not present

## 2023-02-07 DIAGNOSIS — I509 Heart failure, unspecified: Secondary | ICD-10-CM | POA: Diagnosis not present

## 2023-02-07 DIAGNOSIS — E1122 Type 2 diabetes mellitus with diabetic chronic kidney disease: Secondary | ICD-10-CM | POA: Diagnosis not present

## 2023-02-07 DIAGNOSIS — N3281 Overactive bladder: Secondary | ICD-10-CM | POA: Diagnosis not present

## 2023-02-07 DIAGNOSIS — E039 Hypothyroidism, unspecified: Secondary | ICD-10-CM | POA: Diagnosis not present

## 2023-02-07 DIAGNOSIS — Z0189 Encounter for other specified special examinations: Secondary | ICD-10-CM | POA: Diagnosis not present

## 2023-02-07 DIAGNOSIS — E782 Mixed hyperlipidemia: Secondary | ICD-10-CM | POA: Diagnosis not present

## 2023-02-07 DIAGNOSIS — Z0000 Encounter for general adult medical examination without abnormal findings: Secondary | ICD-10-CM | POA: Diagnosis not present

## 2023-02-07 DIAGNOSIS — R1310 Dysphagia, unspecified: Secondary | ICD-10-CM | POA: Diagnosis not present

## 2023-02-16 ENCOUNTER — Encounter: Payer: Self-pay | Admitting: Adult Health

## 2023-02-17 ENCOUNTER — Other Ambulatory Visit (HOSPITAL_COMMUNITY): Payer: Self-pay | Admitting: *Deleted

## 2023-02-17 DIAGNOSIS — R1310 Dysphagia, unspecified: Secondary | ICD-10-CM

## 2023-02-23 ENCOUNTER — Ambulatory Visit: Payer: Medicare Other | Admitting: Dermatology

## 2023-03-03 ENCOUNTER — Ambulatory Visit (HOSPITAL_COMMUNITY)
Admission: RE | Admit: 2023-03-03 | Discharge: 2023-03-03 | Disposition: A | Discharge status: Home / Self Care | Payer: Medicare Other | Source: Ambulatory Visit | Attending: Geriatric Medicine | Admitting: Geriatric Medicine

## 2023-03-03 DIAGNOSIS — R1310 Dysphagia, unspecified: Secondary | ICD-10-CM

## 2023-03-03 DIAGNOSIS — E890 Postprocedural hypothyroidism: Secondary | ICD-10-CM | POA: Insufficient documentation

## 2023-03-03 DIAGNOSIS — R638 Other symptoms and signs concerning food and fluid intake: Secondary | ICD-10-CM | POA: Diagnosis not present

## 2023-03-14 DIAGNOSIS — E782 Mixed hyperlipidemia: Secondary | ICD-10-CM | POA: Diagnosis not present

## 2023-03-14 DIAGNOSIS — Z23 Encounter for immunization: Secondary | ICD-10-CM | POA: Diagnosis not present

## 2023-03-14 DIAGNOSIS — R1310 Dysphagia, unspecified: Secondary | ICD-10-CM | POA: Diagnosis not present

## 2023-03-28 ENCOUNTER — Encounter: Payer: Self-pay | Admitting: Dermatology

## 2023-03-28 ENCOUNTER — Ambulatory Visit: Payer: Medicare Other | Admitting: Dermatology

## 2023-03-28 VITALS — BP 101/65

## 2023-03-28 DIAGNOSIS — L900 Lichen sclerosus et atrophicus: Secondary | ICD-10-CM | POA: Diagnosis not present

## 2023-03-28 DIAGNOSIS — D492 Neoplasm of unspecified behavior of bone, soft tissue, and skin: Secondary | ICD-10-CM

## 2023-03-28 DIAGNOSIS — C510 Malignant neoplasm of labium majus: Secondary | ICD-10-CM | POA: Diagnosis not present

## 2023-03-28 DIAGNOSIS — R2232 Localized swelling, mass and lump, left upper limb: Secondary | ICD-10-CM | POA: Diagnosis not present

## 2023-03-28 DIAGNOSIS — D485 Neoplasm of uncertain behavior of skin: Secondary | ICD-10-CM

## 2023-03-28 NOTE — Progress Notes (Signed)
   Follow-Up Visit   Subjective  Joan Vargas is a 67 y.o. female who presents for the following: Lichen sclerosus et Atrophicus follow up. She is alternating Tacrolimus with Clobetasol every 2 weeks. She says Tacrolimus is a tier 4 medication and it is expensive. She is still having some itching. She has also had increased urination and thinks she may need to follow up with the urologist.  She also has a cyst of her left post shoulder that is getting larger and being irritated by her bra strap. She would like to discuss having it removed.   The following portions of the chart were reviewed this encounter and updated as appropriate: medications, allergies, medical history  Review of Systems:  No other skin or systemic complaints except as noted in HPI or Assessment and Plan.  Objective  Well appearing patient in no apparent distress; mood and affect are within normal limits.   A focused examination was performed of the following areas: Genital area, back  Relevant exam findings are noted in the Assessment and Plan.    Assessment & Plan   LICHEN SCLEROSUS ET ATROPHICUS Exam: erythematous and white patches +/- erosions at right labia majora  Chronic and persistent condition with duration or expected duration over one year. Condition is symptomatic / bothersome to patient. Not to goal.   Lichen sclerosus is a chronic inflammatory condition of unknown cause that frequently involves the vaginal area and less commonly extragenital skin, and is NOT sexually transmitted. It frequently causes symptoms of pain and burning.  It requires regular monitoring and treatment with topical steroids to minimize inflammation and to reduce risk of scarring. There is also a risk of cancer in the vaginal area which is very low if inflammation is well controlled. Regular checks of the area are recommended. Please call if you notice any new or changing spots within this area.  Treatment Plan:  Neoplasm of  uncertain behavior of skin Right labia majora  Skin / nail biopsy Type of biopsy: punch   Informed consent: discussed and consent obtained   Timeout: patient name, date of birth, surgical site, and procedure verified   Procedure prep:  Patient was prepped and draped in usual sterile fashion (the patient was cleaned and prepped) Prep type:  Isopropyl alcohol Anesthesia: the lesion was anesthetized in a standard fashion   Anesthetic:  1% lidocaine w/ epinephrine 1-100,000 buffered w/ 8.4% NaHCO3 Punch size:  3 mm Suture size:  4-0 Suture type: nylon   Hemostasis achieved with: suture, pressure and aluminum chloride   Outcome: patient tolerated procedure well   Post-procedure details: sterile dressing applied and wound care instructions given   Dressing type: bandage, petrolatum and pressure dressing    Cyst vs Encapsulated lipoma Exam: 2.5 cm soft SQ nodule    Treatment Plan: We will refer to Dr Caralyn Guile for excision   Return for Surgery with Dr Caralyn Guile - cyst vs lipoma of left post shoulder, 3 months with Dr Onalee Hua for f/u.  I, Joanie Coddington, CMA, am acting as scribe for [employer], DO .   Documentation: I have reviewed the above documentation for accuracy and completeness, and I agree with the above.  Joan Reusing, DO

## 2023-03-28 NOTE — Patient Instructions (Signed)

## 2023-03-29 DIAGNOSIS — N3281 Overactive bladder: Secondary | ICD-10-CM | POA: Diagnosis not present

## 2023-03-29 DIAGNOSIS — R300 Dysuria: Secondary | ICD-10-CM | POA: Diagnosis not present

## 2023-03-29 DIAGNOSIS — L900 Lichen sclerosus et atrophicus: Secondary | ICD-10-CM | POA: Diagnosis not present

## 2023-03-30 ENCOUNTER — Other Ambulatory Visit: Payer: Self-pay

## 2023-03-30 ENCOUNTER — Telehealth: Payer: Self-pay

## 2023-03-30 DIAGNOSIS — C4499 Other specified malignant neoplasm of skin, unspecified: Secondary | ICD-10-CM

## 2023-03-30 LAB — SURGICAL PATHOLOGY

## 2023-03-30 NOTE — Telephone Encounter (Signed)
Dr Onalee Hua advised patient of results. I sent a referral to Dr Eugene Garnet at [facility] at [address]

## 2023-03-30 NOTE — Telephone Encounter (Signed)
-----   Message from Langston Reusing sent at 03/30/2023  3:52 PM EDT ----- Hi Tarnesha Ulloa,  I spoke with patient and discussed that the results of the bx of the vulva confirm a dx of extramammary Paget's.  She is aware that she will need further treatment by a gyneoncologist for possible surgery and or / radiation.  Please send a referral to Dr. Mariel Craft  [facility] at [facility] [address] [place], [place] [address] [phone] Locate on Map  REPORT OF DERMATOPATHOLOGY FINAL DIAGNOSIS and MICROSCOPIC DESCRIPTION Diagnosis Skin , right labia majora EXTRAMAMMARY PAGET'S DISEASE, SEE DESCRIPTION Microscopic Description Sections show a punch biopsy of skin within the epidermis there is a proliferation of large epithelioid cells with abundant basophilic cytoplasm. The nuclei is large with an open chromatin. There is extension of these cells to the follicular epithelium. The dermis shows a perivascular predominately lymphocytic inflammatory infiltrate and fibroplasia. Immunohistochemistry stains for CK7 highlight these cells. The CK20, S100 and Melan-A stains are negative. Multiple sections have been examined. (MM:gt, 03/30/23)

## 2023-04-01 ENCOUNTER — Telehealth: Payer: Self-pay

## 2023-04-01 DIAGNOSIS — C510 Malignant neoplasm of labium majus: Secondary | ICD-10-CM | POA: Diagnosis not present

## 2023-04-01 NOTE — Telephone Encounter (Signed)
Spoke with Ms. Nelles regarding her referral to GYN oncology. She has an appointment scheduled with Dr. Alvester Morin on 04/25/23 at 9:00. Patient agrees to date and time. She has been provided with office address and location. She is also aware of our mask and visitor policy. Patient verbalized understanding and will call with any questions.

## 2023-04-13 ENCOUNTER — Encounter: Payer: Self-pay | Admitting: Dermatology

## 2023-04-13 ENCOUNTER — Ambulatory Visit: Payer: Medicare Other | Admitting: Dermatology

## 2023-04-13 VITALS — BP 98/64 | HR 83

## 2023-04-13 DIAGNOSIS — L929 Granulomatous disorder of the skin and subcutaneous tissue, unspecified: Secondary | ICD-10-CM | POA: Diagnosis not present

## 2023-04-13 DIAGNOSIS — D492 Neoplasm of unspecified behavior of bone, soft tissue, and skin: Secondary | ICD-10-CM

## 2023-04-13 DIAGNOSIS — D485 Neoplasm of uncertain behavior of skin: Secondary | ICD-10-CM

## 2023-04-13 NOTE — Progress Notes (Signed)
   Follow-Up Visit   Subjective  Joan Vargas is a 67 y.o. female who presents for the following: Excision of neoplasm of skin on left posterior shoulder  The following portions of the chart were reviewed this encounter and updated as appropriate: medications, allergies, medical history  Review of Systems:  No other skin or systemic complaints except as noted in HPI or Assessment and Plan.  Objective  Well appearing patient in no apparent distress; mood and affect are within normal limits.  A focused examination was performed of the following areas: Left posterior shoulder Relevant physical exam findings are noted in the Assessment and Plan.   left posterior shoulder Firm nodule        Assessment & Plan   Neoplasm of uncertain behavior of skin left posterior shoulder  Skin excision  Lesion length (cm):  3.2 Lesion width (cm):  3 Margin per side (cm):  0.1 Total excision diameter (cm):  3.4 Informed consent: discussed and consent obtained   Timeout: patient name, date of birth, surgical site, and procedure verified   Procedure prep:  Patient was prepped and draped in usual sterile fashion Prep type:  Chlorhexidine Anesthesia: the lesion was anesthetized in a standard fashion   Anesthetic:  1% lidocaine w/ epinephrine 1-100,000 local infiltration Instrument used: #15 blade   Hemostasis achieved with: suture   Hemostasis achieved with comment:  3.0 PDS, 5.0 fast absorbing Outcome: patient tolerated procedure well with no complications   Post-procedure details: sterile dressing applied and wound care instructions given   Dressing type: pressure dressing and bacitracin    Skin repair Complexity:  Intermediate Final length (cm):  4.5 Informed consent: discussed and consent obtained   Timeout: patient name, date of birth, surgical site, and procedure verified   Procedure prep:  Patient was prepped and draped in usual sterile fashion Prep type:   Chlorhexidine Anesthesia: the lesion was anesthetized in a standard fashion   Anesthetic:  1% lidocaine w/ epinephrine 1-100,000 local infiltration Reason for type of repair: reduce tension to allow closure, reduce the risk of dehiscence, infection, and necrosis, preserve normal anatomy and preserve normal anatomical and functional relationships   Undermining: edges undermined   Subcutaneous layers (deep stitches):  Suture size:  3-0 Suture type: PDS (polydioxanone)   Stitches:  Buried vertical mattress Fine/surface layer approximation (top stitches):  Suture size:  5-0 Suture type: fast-absorbing plain gut   Stitches: simple running   Hemostasis achieved with: suture, pressure and electrodesiccation Outcome: patient tolerated procedure well with no complications   Post-procedure details: sterile dressing applied and wound care instructions given   Dressing type: pressure dressing and bacitracin    Specimen 1 - Surgical pathology Differential Diagnosis: R/O cyst vs Lipoma vs other soft tissue mass--multiple pieces of same tumor  Check Margins: No    Return if symptoms worsen or fail to improve.  I, Tillie Fantasia, CMA, am acting as scribe for Gwenith Daily, MD.   Documentation: I have reviewed the above documentation for accuracy and completeness, and I agree with the above.  Gwenith Daily, MD

## 2023-04-13 NOTE — Patient Instructions (Signed)

## 2023-04-15 LAB — SURGICAL PATHOLOGY

## 2023-04-19 DIAGNOSIS — N3281 Overactive bladder: Secondary | ICD-10-CM | POA: Diagnosis not present

## 2023-04-19 DIAGNOSIS — N9089 Other specified noninflammatory disorders of vulva and perineum: Secondary | ICD-10-CM | POA: Diagnosis not present

## 2023-04-21 ENCOUNTER — Encounter: Payer: Self-pay | Admitting: Psychiatry

## 2023-04-25 ENCOUNTER — Inpatient Hospital Stay (HOSPITAL_BASED_OUTPATIENT_CLINIC_OR_DEPARTMENT_OTHER): Payer: Medicare Other | Admitting: Psychiatry

## 2023-04-25 ENCOUNTER — Inpatient Hospital Stay: Payer: Medicare Other | Attending: Psychiatry

## 2023-04-25 ENCOUNTER — Inpatient Hospital Stay (HOSPITAL_BASED_OUTPATIENT_CLINIC_OR_DEPARTMENT_OTHER): Payer: Medicare Other | Admitting: Gynecologic Oncology

## 2023-04-25 ENCOUNTER — Encounter: Payer: Self-pay | Admitting: Psychiatry

## 2023-04-25 VITALS — BP 109/68 | HR 70 | Temp 98.3°F | Resp 20 | Ht 61.0 in | Wt 174.8 lb

## 2023-04-25 DIAGNOSIS — Z7982 Long term (current) use of aspirin: Secondary | ICD-10-CM | POA: Diagnosis not present

## 2023-04-25 DIAGNOSIS — C510 Malignant neoplasm of labium majus: Secondary | ICD-10-CM | POA: Diagnosis not present

## 2023-04-25 DIAGNOSIS — C4499 Other specified malignant neoplasm of skin, unspecified: Secondary | ICD-10-CM

## 2023-04-25 DIAGNOSIS — F419 Anxiety disorder, unspecified: Secondary | ICD-10-CM | POA: Insufficient documentation

## 2023-04-25 DIAGNOSIS — Z87891 Personal history of nicotine dependence: Secondary | ICD-10-CM | POA: Insufficient documentation

## 2023-04-25 DIAGNOSIS — K219 Gastro-esophageal reflux disease without esophagitis: Secondary | ICD-10-CM | POA: Diagnosis not present

## 2023-04-25 DIAGNOSIS — Z791 Long term (current) use of non-steroidal anti-inflammatories (NSAID): Secondary | ICD-10-CM | POA: Diagnosis not present

## 2023-04-25 DIAGNOSIS — F32A Depression, unspecified: Secondary | ICD-10-CM | POA: Diagnosis not present

## 2023-04-25 DIAGNOSIS — E079 Disorder of thyroid, unspecified: Secondary | ICD-10-CM | POA: Diagnosis not present

## 2023-04-25 DIAGNOSIS — Z79621 Long term (current) use of calcineurin inhibitor: Secondary | ICD-10-CM | POA: Insufficient documentation

## 2023-04-25 DIAGNOSIS — M1990 Unspecified osteoarthritis, unspecified site: Secondary | ICD-10-CM | POA: Insufficient documentation

## 2023-04-25 DIAGNOSIS — C519 Malignant neoplasm of vulva, unspecified: Secondary | ICD-10-CM

## 2023-04-25 DIAGNOSIS — E785 Hyperlipidemia, unspecified: Secondary | ICD-10-CM | POA: Diagnosis not present

## 2023-04-25 DIAGNOSIS — R8271 Bacteriuria: Secondary | ICD-10-CM | POA: Diagnosis not present

## 2023-04-25 DIAGNOSIS — Z79899 Other long term (current) drug therapy: Secondary | ICD-10-CM | POA: Diagnosis not present

## 2023-04-25 DIAGNOSIS — M797 Fibromyalgia: Secondary | ICD-10-CM | POA: Diagnosis not present

## 2023-04-25 DIAGNOSIS — Z7985 Long-term (current) use of injectable non-insulin antidiabetic drugs: Secondary | ICD-10-CM | POA: Diagnosis not present

## 2023-04-25 DIAGNOSIS — Z7984 Long term (current) use of oral hypoglycemic drugs: Secondary | ICD-10-CM | POA: Insufficient documentation

## 2023-04-25 DIAGNOSIS — E119 Type 2 diabetes mellitus without complications: Secondary | ICD-10-CM | POA: Diagnosis not present

## 2023-04-25 DIAGNOSIS — Z79890 Hormone replacement therapy (postmenopausal): Secondary | ICD-10-CM | POA: Insufficient documentation

## 2023-04-25 DIAGNOSIS — Z90721 Acquired absence of ovaries, unilateral: Secondary | ICD-10-CM | POA: Diagnosis not present

## 2023-04-25 DIAGNOSIS — N3281 Overactive bladder: Secondary | ICD-10-CM | POA: Diagnosis not present

## 2023-04-25 DIAGNOSIS — I10 Essential (primary) hypertension: Secondary | ICD-10-CM | POA: Diagnosis not present

## 2023-04-25 DIAGNOSIS — G2581 Restless legs syndrome: Secondary | ICD-10-CM | POA: Insufficient documentation

## 2023-04-25 DIAGNOSIS — Z90710 Acquired absence of both cervix and uterus: Secondary | ICD-10-CM | POA: Insufficient documentation

## 2023-04-25 MED ORDER — SENNOSIDES-DOCUSATE SODIUM 8.6-50 MG PO TABS: 2.0000 | ORAL_TABLET | Freq: Every day | ORAL | 0 refills | Status: DC

## 2023-04-25 MED ORDER — TRAMADOL HCL 50 MG PO TABS: 50.0000 mg | ORAL_TABLET | Freq: Four times a day (QID) | ORAL | 0 refills | Status: DC | PRN

## 2023-04-25 NOTE — Patient Instructions (Addendum)
Today, Dr. Alvester Morin took 4 biopsies from the vulva. You will be contacted with the results.   YOU WILL NEED TO HOLD OZEMPIC FOR ONE WEEK BEFORE SURGERY.  Preparing for your Surgery  Plan for surgery on May 09, 2025 with Dr. Clide Cliff at [facility]. You will be scheduled for pelvic examination under anesthesia, wide local excision of the vulva.   Pre-operative Testing -You will receive a phone call from presurgical testing at [facility] to discuss surgery instructions and arrange for lab work if needed.  -Bring your insurance card, copy of an advanced directive if applicable, medication list.  -You can continue taking your baby aspirin with the last dose being the morning on the day BEFORE surgery. DO NOT TAKE THE MORNING OF SURGERY.  -Do not take supplements such as fish oil (omega 3), red yeast rice, turmeric before your surgery. You want to avoid medications with aspirin in them including headache powders such as BC or Goody's), Excedrin migraine.  Day Before Surgery at Home -You will be advised you can have clear liquids up until 3 hours before your surgery.    Your role in recovery Your role is to become active as soon as directed by your doctor, while still giving yourself time to heal.  Rest when you feel tired. You will be asked to do the following in order to speed your recovery:  - Cough and breathe deeply. This helps to clear and expand your lungs and can prevent pneumonia after surgery.  - STAY ACTIVE WHEN YOU GET HOME. Do mild physical activity. Walking or moving your legs help your circulation and body functions return to normal. Do not try to get up or walk alone the first time after surgery.   -If you develop swelling on one leg or the other, pain in the back of your leg, redness/warmth in one of your legs, please call the office or go to the Emergency Room to have a doppler to rule out a blood clot. For shortness of breath,  chest pain-seek care in the Emergency Room as soon as possible. - Actively manage your pain. Managing your pain lets you move in comfort. We will ask you to rate your pain on a scale of zero to 10. It is your responsibility to tell your doctor or nurse where and how much you hurt so your pain can be treated.  Special Considerations -Your final pathology results from surgery should be available around one week after surgery and the results will be relayed to you when available.  -FMLA forms can be faxed to [phone] and please allow 5-7 business days for completion.  Pain Management After Surgery -You will be prescribed your pain medication and bowel regimen medications before surgery so that you can have these available when you are discharged from the hospital. The pain medication is for use ONLY AFTER surgery and a new prescription will not be given.   -Make sure that you have Tylenol and Ibuprofen at home IF YOU ARE ABLE TO TAKE THESE MEDICATIONS to use on a regular basis after surgery for pain control. We recommend alternating the medications every hour to six hours since they work differently and are processed in the body differently for pain relief.  -Review the attached handout on narcotic use and their risks and side effects.   Bowel Regimen -You will be prescribed Sennakot-S to take nightly to prevent constipation especially if you are taking the narcotic pain medication intermittently.  It is important to prevent constipation and drink adequate amounts of liquids. You can stop taking this medication when you are not taking pain medication and you are back on your normal bowel routine.  Risks of Surgery Risks of surgery are low but include bleeding, infection, damage to surrounding structures, re-operation, blood clots, and very rarely death.  AFTER SURGERY INSTRUCTIONS  Return to work:  variable based on occupation  Activity: 1. Be up and out of the bed during the day.  Take a  nap if needed.  You may walk up steps but be careful and use the hand rail.  Stair climbing will tire you more than you think, you may need to stop part way and rest.  2. No lifting or straining for 2 weeks over 10 pounds, ideally 4 weeks. No pushing, pulling, straining for 2 weeks.  3. No driving for minimum 24 hours after surgery but this is usually longer.  Do not drive if you are taking narcotic pain medicine and make sure that your reaction time has returned.   4. You can shower as soon as the next day after surgery. Shower daily. No tub baths or submerging your body in water until cleared by your surgeon. If you have the soap that was given to you by pre-surgical testing that was used before surgery, you do not need to use it afterwards because this can irritate your incisions.   5. No sexual activity and nothing in the vagina for 4 weeks.  6. You may experience vulvar spotting and discharge after surgery.  The spotting is normal but if you experience heavy bleeding, call our office.  7. Take Tylenol or ibuprofen first for pain if you are able to take these medications and only use narcotic pain medication for severe pain not relieved by the Tylenol or Ibuprofen.  Monitor your Tylenol intake to a max of 4,000 mg in a 24 hour period. You can alternate these medications after surgery.  Diet: 1. Low sodium Heart Healthy Diet is recommended but you are cleared to resume your normal (before surgery) diet after your procedure.  2. It is safe to use a laxative, such as Miralax or Colace, if you have difficulty moving your bowels. You have been prescribed Sennakot at bedtime every evening to keep bowel movements regular and to prevent constipation.    Wound Care: 1. Keep clean and dry.  Shower daily.  Reasons to call the Doctor: Fever - Oral temperature greater than 100.4 degrees Fahrenheit Foul-smelling vaginal discharge Difficulty urinating Nausea and vomiting Increased pain at the site of  the incision that is unrelieved with pain medicine. Difficulty breathing with or without chest pain New calf pain especially if only on one side Sudden, continuing increased vaginal bleeding with or without clots.   Contacts: For questions or concerns you should contact:  Dr. Clide Cliff at [phone]  Warner Mccreedy, NP at [phone]  After Hours: call [phone] and have the GYN Oncologist paged/contacted (after 5 pm or on the weekends).  Messages sent via mychart are for non-urgent matters and are not responded to after hours so for urgent needs, please call the after hours number.

## 2023-04-25 NOTE — H&P (View-Only) (Signed)
GYNECOLOGIC ONCOLOGY NEW PATIENT CONSULTATION  Date of Service: 04/25/2023 Referring Provider: Langston Reusing, MD   ASSESSMENT AND PLAN: Joan Vargas is a 67 y.o. woman with extramammary Paget's disease of the vulva.  Reviewed the nature of vulvar Paget's disease.  Reviewed recommended treatment with wide local excision.  Reviewed low risk of invasive disease but this will be evaluated with surgical excision.  Also reviewed risk of concurrent malignancy.  Patient is up-to-date on mammogram and colonoscopy.  Will obtain urine cytology today.  Will also plan for patient to undergo pelvic ultrasound to rule out adnexal etiology.  Otherwise, additional scouting biopsies obtained today for planning surgery to to further evaluate extent of disease and planning margins.  Will follow-up these results.  Patient was consented for: pelvic exam under anesthesia, vulvar wide local excision on 05/10/23.  The risks of surgery were discussed in detail and she understands these to including but not limited to bleeding requiring a blood transfusion, infection, injury to adjacent organs (including but not limited to the bowels, bladder, ureters, nerves, blood vessels), thromboembolic events, wound separation, unforseen complication, and possible need for re-exploration.  If the patient experiences any of these events, she understands that her hospitalization or recovery may be prolonged and that she may need to take additional medications for a prolonged period. The patient will receive DVT and antibiotic prophylaxis as indicated. She voiced a clear understanding. She had the opportunity to ask questions and informed consent was obtained today. She wishes to proceed.  We will obtain her most recent A1c from her PCP. She does not require preoperative clearance. Her METs are >4.  All preoperative instructions were reviewed. Postoperative expectations were also reviewed. Written handouts were provided to the  patient.   A copy of this note was sent to the patient's referring provider.  Clide Cliff, MD Gynecologic Oncology   Medical Decision Making I personally spent  TOTAL 70 minutes face-to-face and non-face-to-face in the care of this patient, which includes all pre, intra, and post visit time on the date of service.   ------------  CC: Extramammary paget's disease of the vulva  HISTORY OF PRESENT ILLNESS:  Joan Vargas is a 67 y.o. woman who is seen in consultation at the request of Langston Reusing, MD for evaluation of vulvar paget's disease.  Pt presented to her dermatologist on 03/28/23 for follow-up of lichen sclerosus. She was being treated with alternating tacrolimus and clobetasol every 2 weeks. She reported ongoing itching. Exam noted erythematous and white patches with erosion of the right labia majora. This was biopsied and returned with extramammary paget's disease.  Today patient presents alone.  Endorses the history as above.    PAST MEDICAL HISTORY: Past Medical History:  Diagnosis Date   Allergy    Anemia    Anxiety    Arthritis    knees and thumbs    Depression    Diabetes mellitus    diet controlled not on meds    Fibromyalgia    GERD (gastroesophageal reflux disease)    Headache(784.0)    Hyperlipidemia    Hypertension    PONV (postoperative nausea and vomiting)    Restless leg    Thyroid disease     PAST SURGICAL HISTORY: Past Surgical History:  Procedure Laterality Date   ABDOMINAL HYSTERECTOMY  06/14/1984   APPENDECTOMY  06/14/1954   BREAST BIOPSY Left 06/23/2004   CESAREAN SECTION  1984, 1986   KNEE ARTHROSCOPY  06/14/1998   right   OOPHORECTOMY Right  possible cyst on ovary   THYROIDECTOMY  11/04/2011   Procedure: THYROIDECTOMY;  Surgeon: Velora Heckler, MD;  Location: [facility] ORS;  Service: General;  Laterality: N/A;  Total Thyroidectomy   TOTAL KNEE ARTHROPLASTY Right 11/09/2013   Procedure: RIGHT TOTAL KNEE ARTHROPLASTY;  Surgeon:  Verlee Rossetti, MD;  Location: [facility] OR;  Service: Orthopedics;  Laterality: Right;    OB/GYN HISTORY: OB History  Gravida Para Term Preterm AB Living  2 2 2     2   SAB IAB Ectopic Multiple Live Births          2    # Outcome Date GA Lbr Len/2nd Weight Sex Type Anes PTL Lv  2 Term      CS-Unspec   LIV  1 Term      CS-Unspec   LIV      Age at menarche: 59 Age at menopause: Hysterectomy age 68 Hx of HRT: no Hx of STI: no Last pap: unknown, hyst age 75 History of abnormal pap smears: none  SCREENING STUDIES:  Last mammogram: 07/2022 Last colonoscopy: 08/2021, 3 year follow-up  MEDICATIONS:  Current Outpatient Medications:    aspirin 81 MG tablet, Take 81 mg by mouth at bedtime. , Disp: , Rfl:    clobetasol cream (TEMOVATE) 0.05 %, Apply twice daily to affected areas for 2 weeks then as needed only., Disp: 60 g, Rfl: 2   Continuous Glucose Sensor (DEXCOM G7 SENSOR) MISC, CHANGE SENSOR EVERY 10 DAYS, Disp: , Rfl:    D2000 ULTRA STRENGTH 50 MCG (2000 UT) CAPS, Take 1 capsule by mouth at bedtime., Disp: , Rfl:    diclofenac sodium (VOLTAREN) 1 % GEL, Apply 1 application  topically 2 (two) times daily. Pain for thumb and knees, Disp: , Rfl:    esomeprazole (NEXIUM) 20 MG capsule, Take 20 mg by mouth daily., Disp: , Rfl:    estradiol (ESTRACE) 0.1 MG/GM vaginal cream, Place vaginally every Saturday. , Disp: , Rfl:    fluticasone (FLONASE) 50 MCG/ACT nasal spray, Place 1 spray into both nostrils at bedtime. , Disp: , Rfl:    gabapentin (NEURONTIN) 300 MG capsule, Take 300 mg by mouth at bedtime., Disp: , Rfl:    Glucosamine-Chondroitin (MOVE FREE PO), Take 1 capsule by mouth every morning., Disp: , Rfl:    levothyroxine (SYNTHROID) 150 MCG tablet, Take 150 mcg by mouth daily., Disp: , Rfl:    metFORMIN (GLUCOPHAGE) 1000 MG tablet, Take 1,000 mg by mouth 2 (two) times daily with a meal., Disp: , Rfl:    metoprolol succinate (TOPROL-XL) 50 MG 24 hr tablet, Take 50 mg by mouth every morning.  Take with or immediately following a meal., Disp: , Rfl:    montelukast (SINGULAIR) 10 MG tablet, Take 10 mg by mouth every morning., Disp: , Rfl:    Multiple Vitamin (MULTIVITAMIN) tablet, Take 1 tablet by mouth every morning. , Disp: , Rfl:    olmesartan-hydrochlorothiazide (BENICAR HCT) 40-12.5 MG tablet, Take 1 tablet by mouth daily., Disp: , Rfl:    OVER THE COUNTER MEDICATION, [employer] Allergy 25 mg. One tablet at night., Disp: , Rfl:    OVER THE COUNTER MEDICATION, Tumeric two tablets one time daily., Disp: , Rfl:    OVER THE COUNTER MEDICATION, Biotin 10, 000 mcg one tablet daily., Disp: , Rfl:    oxybutynin (DITROPAN-XL) 5 MG 24 hr tablet, Take 5 mg by mouth daily., Disp: , Rfl:    OZEMPIC, 0.25 OR 0.5 MG/DOSE, 2 MG/3ML SOPN, SMARTSIG:0.45 Milligram(s)  SUB-Q Once a Week, Disp: , Rfl:    pramipexole (MIRAPEX) 0.25 MG tablet, Take 0.25 mg by mouth at bedtime., Disp: , Rfl:    rosuvastatin (CRESTOR) 5 MG tablet, Take 5 mg by mouth every other day., Disp: , Rfl:    tiZANidine (ZANAFLEX) 4 MG tablet, Take 4 mg by mouth every 6 (six) hours as needed for muscle spasms., Disp: , Rfl:    ondansetron (ZOFRAN ODT) 4 MG disintegrating tablet, 4mg  ODT q4 hours prn nausea/vomit (Patient not taking: Reported on 04/21/2023), Disp: 8 tablet, Rfl: 0   tacrolimus (PROTOPIC) 0.1 % ointment, Apply twice daily to affected areas in groin. (Patient not taking: Reported on 04/21/2023), Disp: 60 g, Rfl: 2  ALLERGIES: Allergies  Allergen Reactions   Aspirin Nausea And Vomiting    REACTION: stomach upset   Sulfa Antibiotics Itching    FAMILY HISTORY: Family History  Problem Relation Age of Onset   Diabetes Mother    Colon cancer Neg Hx    Stomach cancer Neg Hx    Esophageal cancer Neg Hx    Rectal cancer Neg Hx    Colon polyps Neg Hx    Breast cancer Neg Hx    Ovarian cancer Neg Hx    Endometrial cancer Neg Hx     SOCIAL HISTORY: Social History   Socioeconomic History   Marital status: Married     Spouse name: Not on file   Number of children: Not on file   Years of education: Not on file   Highest education level: Not on file  Occupational History   Not on file  Tobacco Use   Smoking status: Former    Current packs/day: 0.00    Average packs/day: 0.5 packs/day for 5.0 years (2.5 ttl pk-yrs)    Types: Cigarettes    Start date: 06/14/1976    Quit date: 06/14/1981    Years since quitting: 41.8   Smokeless tobacco: Never  Vaping Use   Vaping status: Never Used  Substance and Sexual Activity   Alcohol use: No    Comment: occasional   Drug use: No   Sexual activity: Not Currently  Other Topics Concern   Not on file  Social History Narrative   Not on file   Social Determinants of Health   Financial Resource Strain: Not on file  Food Insecurity: No Food Insecurity (04/21/2023)   Hunger Vital Sign    Worried About Running Out of Food in the Last Year: Never true    Ran Out of Food in the Last Year: Never true  Transportation Needs: No Transportation Needs (04/21/2023)   PRAPARE - Administrator, Civil Service (Medical): No    Lack of Transportation (Non-Medical): No  Physical Activity: Not on file  Stress: Not on file  Social Connections: Not on file  Intimate Partner Violence: Not At Risk (04/21/2023)   Humiliation, Afraid, Rape, and Kick questionnaire    Fear of Current or Ex-Partner: No    Emotionally Abused: No    Physically Abused: No    Sexually Abused: No    REVIEW OF SYSTEMS: New patient intake form was reviewed.  Complete 10-system review is negative except for the following: Headache, ringing in ears  PHYSICAL EXAM: BP 109/68 (BP Location: Right Arm, Patient Position: Sitting)   Pulse 70   Temp 98.3 F (36.8 C) (Oral)   Resp 20   Ht 5\' 1"  (1.549 m)   Wt 174 lb 12.8 oz (79.3 kg)   SpO2  100%   BMI 33.03 kg/m  Constitutional: No acute distress. Neuro/Psych: Alert, oriented.  Head and Neck: Normocephalic, atraumatic. Neck symmetric without  masses. Sclera anicteric.  Respiratory: Normal work of breathing. Clear to auscultation bilaterally. Cardiovascular: Regular rate and rhythm, no murmurs, rubs, or gallops. Abdomen: Normoactive bowel sounds. Soft, non-distended, non-tender to palpation. No masses appreciated. Well healed pfannenstiel incision. Extremities: Grossly normal range of motion. Warm, well perfused. No edema bilaterally. Skin: No rashes or lesions. Lymphatic: No cervical, supraclavicular, or inguinal adenopathy. Genitourinary: Speculum exam with normal vaginal mucosa.  Cervix surgically absent.  Bimanual exam with smooth vaginal mucosa.  No pelvic mass or nodularity.  On external genitalia exam, inflamed erythematous and white area of the medial right labia majora involving the majority of the medial surface, and extending to the hairline of the labia majora.  Not quite approaching the vaginal mucosa. Exam chaperoned by Warner Mccreedy, NP  Physical Exam Genitourinary:    Purple indicates locations of biopsies (anterior right vulva, lateral right vulva, posterior right vulva, medial right vulva)   LABORATORY AND RADIOLOGIC DATA: Outside medical records were reviewed to synthesize the above history, along with the history and physical obtained during the visit.  Outside laboratory, pathology reports were reviewed, with pertinent results below.   WBC  Date Value Ref Range Status  11/22/2014 13.0 (H) 4.0 - 10.5 K/uL Final   Hemoglobin  Date Value Ref Range Status  11/22/2014 13.8 12.0 - 15.0 g/dL Final   HCT  Date Value Ref Range Status  11/22/2014 39.1 36.0 - 46.0 % Final   Platelets  Date Value Ref Range Status  11/22/2014 199 150 - 400 K/uL Final   Creatinine, Ser  Date Value Ref Range Status  11/22/2014 0.88 0.44 - 1.00 mg/dL Final   AST  Date Value Ref Range Status  11/22/2014 31 15 - 41 U/L Final   ALT  Date Value Ref Range Status  11/22/2014 55 (H) 14 - 54 U/L Final    Surgical pathology  (03/28/23): FINAL DIAGNOSIS       1. Skin, right labia majora :       EXTRAMAMMARY PAGET'S DISEASE, SEE DESCRIPTION  MICROSCOPIC DESCRIPTION 1. Sections show a punch biopsy of skin within the epidermis there is a proliferation of large epithelioid cells with abundant basophilic cytoplasm. The nuclei is large with an open chromatin.  There is extension of these cells to the follicular epithelium.  The dermis shows a perivascular predominately lymphocytic inflammatory infiltrate and fibroplasia.  Immunohistochemistry stains for CK7 highlight these cells.  The CK20, S100 and Melan-A stains are negative.  Multiple sections have been examined.   (MM:gt, 03/30/23)

## 2023-04-25 NOTE — Patient Instructions (Signed)
Today, Dr. Alvester Morin took 4 biopsies from the vulva. You will be contacted with the results.    YOU WILL NEED TO HOLD OZEMPIC FOR ONE WEEK BEFORE SURGERY.   Preparing for your Surgery   Plan for surgery on May 10, 2023 with Dr. Clide Cliff at [facility]. You will be scheduled for pelvic examination under anesthesia, wide local excision of the vulva.    Pre-operative Testing -You will receive a phone call from presurgical testing at [facility] to discuss surgery instructions and arrange for lab work if needed.   -Bring your insurance card, copy of an advanced directive if applicable, medication list.   -You can continue taking your baby aspirin with the last dose being the morning on the day BEFORE surgery. DO NOT TAKE THE MORNING OF SURGERY.   -Do not take supplements such as fish oil (omega 3), red yeast rice, turmeric before your surgery. You want to avoid medications with aspirin in them including headache powders such as BC or Goody's), Excedrin migraine.   Day Before Surgery at Home -You will be advised you can have clear liquids up until 3 hours before your surgery.     Your role in recovery Your role is to become active as soon as directed by your doctor, while still giving yourself time to heal.  Rest when you feel tired. You will be asked to do the following in order to speed your recovery:   - Cough and breathe deeply. This helps to clear and expand your lungs and can prevent pneumonia after surgery.  - STAY ACTIVE WHEN YOU GET HOME. Do mild physical activity. Walking or moving your legs help your circulation and body functions return to normal. Do not try to get up or walk alone the first time after surgery.   -If you develop swelling on one leg or the other, pain in the back of your leg, redness/warmth in one of your legs, please call the office or go to the Emergency Room to have a doppler to rule out a blood clot. For shortness  of breath, chest pain-seek care in the Emergency Room as soon as possible. - Actively manage your pain. Managing your pain lets you move in comfort. We will ask you to rate your pain on a scale of zero to 10. It is your responsibility to tell your doctor or nurse where and how much you hurt so your pain can be treated.   Special Considerations -Your final pathology results from surgery should be available around one week after surgery and the results will be relayed to you when available.   -FMLA forms can be faxed to [phone] and please allow 5-7 business days for completion.   Pain Management After Surgery -You will be prescribed your pain medication and bowel regimen medications before surgery so that you can have these available when you are discharged from the hospital. The pain medication is for use ONLY AFTER surgery and a new prescription will not be given.    -Make sure that you have Tylenol and Ibuprofen at home IF YOU ARE ABLE TO TAKE THESE MEDICATIONS to use on a regular basis after surgery for pain control. We recommend alternating the medications every hour to six hours since they work differently and are processed in the body differently for pain relief.   -Review the attached handout on narcotic use and their risks and side effects.    Bowel Regimen -You will be prescribed Sennakot-S  to take nightly to prevent constipation especially if you are taking the narcotic pain medication intermittently.  It is important to prevent constipation and drink adequate amounts of liquids. You can stop taking this medication when you are not taking pain medication and you are back on your normal bowel routine.   Risks of Surgery Risks of surgery are low but include bleeding, infection, damage to surrounding structures, re-operation, blood clots, and very rarely death.   AFTER SURGERY INSTRUCTIONS   Return to work:  variable based on occupation   Activity: 1. Be up and out of the bed  during the day.  Take a nap if needed.  You may walk up steps but be careful and use the hand rail.  Stair climbing will tire you more than you think, you may need to stop part way and rest.   2. No lifting or straining for 2 weeks over 10 pounds, ideally 4 weeks. No pushing, pulling, straining for 2 weeks.   3. No driving for minimum 24 hours after surgery but this is usually longer.  Do not drive if you are taking narcotic pain medicine and make sure that your reaction time has returned.    4. You can shower as soon as the next day after surgery. Shower daily. No tub baths or submerging your body in water until cleared by your surgeon. If you have the soap that was given to you by pre-surgical testing that was used before surgery, you do not need to use it afterwards because this can irritate your incisions.    5. No sexual activity and nothing in the vagina for 4 weeks.   6. You may experience vulvar spotting and discharge after surgery.  The spotting is normal but if you experience heavy bleeding, call our office.   7. Take Tylenol or ibuprofen first for pain if you are able to take these medications and only use narcotic pain medication for severe pain not relieved by the Tylenol or Ibuprofen.  Monitor your Tylenol intake to a max of 4,000 mg in a 24 hour period. You can alternate these medications after surgery.   Diet: 1. Low sodium Heart Healthy Diet is recommended but you are cleared to resume your normal (before surgery) diet after your procedure.   2. It is safe to use a laxative, such as Miralax or Colace, if you have difficulty moving your bowels. You have been prescribed Sennakot at bedtime every evening to keep bowel movements regular and to prevent constipation.     Wound Care: 1. Keep clean and dry.  Shower daily.   Reasons to call the Doctor: Fever - Oral temperature greater than 100.4 degrees Fahrenheit Foul-smelling vaginal discharge Difficulty urinating Nausea and  vomiting Increased pain at the site of the incision that is unrelieved with pain medicine. Difficulty breathing with or without chest pain New calf pain especially if only on one side Sudden, continuing increased vaginal bleeding with or without clots.   Contacts: For questions or concerns you should contact:   Dr. Clide Cliff at [phone]   Warner Mccreedy, NP at [phone]   After Hours: call [phone] and have the GYN Oncologist paged/contacted (after 5 pm or on the weekends).   Messages sent via mychart are for non-urgent matters and are not responded to after hours so for urgent needs, please call the after hours number.

## 2023-04-25 NOTE — Progress Notes (Signed)
GYNECOLOGIC ONCOLOGY NEW PATIENT CONSULTATION  Date of Service: 04/25/2023 Referring Provider: Langston Reusing, MD   ASSESSMENT AND PLAN: Joan Vargas is a 67 y.o. woman with extramammary Paget's disease of the vulva.  Reviewed the nature of vulvar Paget's disease.  Reviewed recommended treatment with wide local excision.  Reviewed low risk of invasive disease but this will be evaluated with surgical excision.  Also reviewed risk of concurrent malignancy.  Patient is up-to-date on mammogram and colonoscopy.  Will obtain urine cytology today.  Will also plan for patient to undergo pelvic ultrasound to rule out adnexal etiology.  Otherwise, additional scouting biopsies obtained today for planning surgery to to further evaluate extent of disease and planning margins.  Will follow-up these results.  Patient was consented for: pelvic exam under anesthesia, vulvar wide local excision on 05/10/23.  The risks of surgery were discussed in detail and she understands these to including but not limited to bleeding requiring a blood transfusion, infection, injury to adjacent organs (including but not limited to the bowels, bladder, ureters, nerves, blood vessels), thromboembolic events, wound separation, unforseen complication, and possible need for re-exploration.  If the patient experiences any of these events, she understands that her hospitalization or recovery may be prolonged and that she may need to take additional medications for a prolonged period. The patient will receive DVT and antibiotic prophylaxis as indicated. She voiced a clear understanding. She had the opportunity to ask questions and informed consent was obtained today. She wishes to proceed.  We will obtain her most recent A1c from her PCP. She does not require preoperative clearance. Her METs are >4.  All preoperative instructions were reviewed. Postoperative expectations were also reviewed. Written handouts were provided to the  patient.   A copy of this note was sent to the patient's referring provider.  Clide Cliff, MD Gynecologic Oncology   Medical Decision Making I personally spent  TOTAL 70 minutes face-to-face and non-face-to-face in the care of this patient, which includes all pre, intra, and post visit time on the date of service.   ------------  CC: Extramammary paget's disease of the vulva  HISTORY OF PRESENT ILLNESS:  Joan Vargas is a 67 y.o. woman who is seen in consultation at the request of Langston Reusing, MD for evaluation of vulvar paget's disease.  Pt presented to her dermatologist on 03/28/23 for follow-up of lichen sclerosus. She was being treated with alternating tacrolimus and clobetasol every 2 weeks. She reported ongoing itching. Exam noted erythematous and white patches with erosion of the right labia majora. This was biopsied and returned with extramammary paget's disease.  Today patient presents alone.  Endorses the history as above.    PAST MEDICAL HISTORY: Past Medical History:  Diagnosis Date   Allergy    Anemia    Anxiety    Arthritis    knees and thumbs    Depression    Diabetes mellitus    diet controlled not on meds    Fibromyalgia    GERD (gastroesophageal reflux disease)    Headache(784.0)    Hyperlipidemia    Hypertension    PONV (postoperative nausea and vomiting)    Restless leg    Thyroid disease     PAST SURGICAL HISTORY: Past Surgical History:  Procedure Laterality Date   ABDOMINAL HYSTERECTOMY  06/14/1984   APPENDECTOMY  06/14/1954   BREAST BIOPSY Left 06/23/2004   CESAREAN SECTION  1984, 1986   KNEE ARTHROSCOPY  06/14/1998   right   OOPHORECTOMY Right  possible cyst on ovary   THYROIDECTOMY  11/04/2011   Procedure: THYROIDECTOMY;  Surgeon: Velora Heckler, MD;  Location: [facility];  Service: General;  Laterality: N/A;  Total Thyroidectomy   TOTAL KNEE ARTHROPLASTY Right 11/09/2013   Procedure: RIGHT TOTAL KNEE ARTHROPLASTY;  Surgeon:  Verlee Rossetti, MD;  Location: [facility] OR;  Service: Orthopedics;  Laterality: Right;    OB/GYN HISTORY: OB History  Gravida Para Term Preterm AB Living  2 2 2     2   SAB IAB Ectopic Multiple Live Births          2    # Outcome Date GA Lbr Len/2nd Weight Sex Type Anes PTL Lv  2 Term      CS-Unspec   LIV  1 Term      CS-Unspec   LIV      Age at menarche: 35 Age at menopause: Hysterectomy age 19 Hx of HRT: no Hx of STI: no Last pap: unknown, hyst age 11 History of abnormal pap smears: none  SCREENING STUDIES:  Last mammogram: 07/2022 Last colonoscopy: 08/2021, 3 year follow-up  MEDICATIONS:  Current Outpatient Medications:    aspirin 81 MG tablet, Take 81 mg by mouth at bedtime. , Disp: , Rfl:    clobetasol cream (TEMOVATE) 0.05 %, Apply twice daily to affected areas for 2 weeks then as needed only., Disp: 60 g, Rfl: 2   Continuous Glucose Sensor (DEXCOM G7 SENSOR) MISC, CHANGE SENSOR EVERY 10 DAYS, Disp: , Rfl:    D2000 ULTRA STRENGTH 50 MCG (2000 UT) CAPS, Take 1 capsule by mouth at bedtime., Disp: , Rfl:    diclofenac sodium (VOLTAREN) 1 % GEL, Apply 1 application  topically 2 (two) times daily. Pain for thumb and knees, Disp: , Rfl:    esomeprazole (NEXIUM) 20 MG capsule, Take 20 mg by mouth daily., Disp: , Rfl:    estradiol (ESTRACE) 0.1 MG/GM vaginal cream, Place vaginally every Saturday. , Disp: , Rfl:    fluticasone (FLONASE) 50 MCG/ACT nasal spray, Place 1 spray into both nostrils at bedtime. , Disp: , Rfl:    gabapentin (NEURONTIN) 300 MG capsule, Take 300 mg by mouth at bedtime., Disp: , Rfl:    Glucosamine-Chondroitin (MOVE FREE PO), Take 1 capsule by mouth every morning., Disp: , Rfl:    levothyroxine (SYNTHROID) 150 MCG tablet, Take 150 mcg by mouth daily., Disp: , Rfl:    metFORMIN (GLUCOPHAGE) 1000 MG tablet, Take 1,000 mg by mouth 2 (two) times daily with a meal., Disp: , Rfl:    metoprolol succinate (TOPROL-XL) 50 MG 24 hr tablet, Take 50 mg by mouth every morning.  Take with or immediately following a meal., Disp: , Rfl:    montelukast (SINGULAIR) 10 MG tablet, Take 10 mg by mouth every morning., Disp: , Rfl:    Multiple Vitamin (MULTIVITAMIN) tablet, Take 1 tablet by mouth every morning. , Disp: , Rfl:    olmesartan-hydrochlorothiazide (BENICAR HCT) 40-12.5 MG tablet, Take 1 tablet by mouth daily., Disp: , Rfl:    OVER THE COUNTER MEDICATION, [employer] Allergy 25 mg. One tablet at night., Disp: , Rfl:    OVER THE COUNTER MEDICATION, Tumeric two tablets one time daily., Disp: , Rfl:    OVER THE COUNTER MEDICATION, Biotin 10, 000 mcg one tablet daily., Disp: , Rfl:    oxybutynin (DITROPAN-XL) 5 MG 24 hr tablet, Take 5 mg by mouth daily., Disp: , Rfl:    OZEMPIC, 0.25 OR 0.5 MG/DOSE, 2 MG/3ML SOPN, SMARTSIG:0.45 Milligram(s)  SUB-Q Once a Week, Disp: , Rfl:    pramipexole (MIRAPEX) 0.25 MG tablet, Take 0.25 mg by mouth at bedtime., Disp: , Rfl:    rosuvastatin (CRESTOR) 5 MG tablet, Take 5 mg by mouth every other day., Disp: , Rfl:    tiZANidine (ZANAFLEX) 4 MG tablet, Take 4 mg by mouth every 6 (six) hours as needed for muscle spasms., Disp: , Rfl:    ondansetron (ZOFRAN ODT) 4 MG disintegrating tablet, 4mg  ODT q4 hours prn nausea/vomit (Patient not taking: Reported on 04/21/2023), Disp: 8 tablet, Rfl: 0   tacrolimus (PROTOPIC) 0.1 % ointment, Apply twice daily to affected areas in groin. (Patient not taking: Reported on 04/21/2023), Disp: 60 g, Rfl: 2  ALLERGIES: Allergies  Allergen Reactions   Aspirin Nausea And Vomiting    REACTION: stomach upset   Sulfa Antibiotics Itching    FAMILY HISTORY: Family History  Problem Relation Age of Onset   Diabetes Mother    Colon cancer Neg Hx    Stomach cancer Neg Hx    Esophageal cancer Neg Hx    Rectal cancer Neg Hx    Colon polyps Neg Hx    Breast cancer Neg Hx    Ovarian cancer Neg Hx    Endometrial cancer Neg Hx     SOCIAL HISTORY: Social History   Socioeconomic History   Marital status: Married     Spouse name: Not on file   Number of children: Not on file   Years of education: Not on file   Highest education level: Not on file  Occupational History   Not on file  Tobacco Use   Smoking status: Former    Current packs/day: 0.00    Average packs/day: 0.5 packs/day for 5.0 years (2.5 ttl pk-yrs)    Types: Cigarettes    Start date: 06/14/1976    Quit date: 06/14/1981    Years since quitting: 41.8   Smokeless tobacco: Never  Vaping Use   Vaping status: Never Used  Substance and Sexual Activity   Alcohol use: No    Comment: occasional   Drug use: No   Sexual activity: Not Currently  Other Topics Concern   Not on file  Social History Narrative   Not on file   Social Determinants of Health   Financial Resource Strain: Not on file  Food Insecurity: No Food Insecurity (04/21/2023)   Hunger Vital Sign    Worried About Running Out of Food in the Last Year: Never true    Ran Out of Food in the Last Year: Never true  Transportation Needs: No Transportation Needs (04/21/2023)   PRAPARE - Administrator, Civil Service (Medical): No    Lack of Transportation (Non-Medical): No  Physical Activity: Not on file  Stress: Not on file  Social Connections: Not on file  Intimate Partner Violence: Not At Risk (04/21/2023)   Humiliation, Afraid, Rape, and Kick questionnaire    Fear of Current or Ex-Partner: No    Emotionally Abused: No    Physically Abused: No    Sexually Abused: No    REVIEW OF SYSTEMS: New patient intake form was reviewed.  Complete 10-system review is negative except for the following: Headache, ringing in ears  PHYSICAL EXAM: BP 109/68 (BP Location: Right Arm, Patient Position: Sitting)   Pulse 70   Temp 98.3 F (36.8 C) (Oral)   Resp 20   Ht 5\' 1"  (1.549 m)   Wt 174 lb 12.8 oz (79.3 kg)   SpO2  100%   BMI 33.03 kg/m  Constitutional: No acute distress. Neuro/Psych: Alert, oriented.  Head and Neck: Normocephalic, atraumatic. Neck symmetric without  masses. Sclera anicteric.  Respiratory: Normal work of breathing. Clear to auscultation bilaterally. Cardiovascular: Regular rate and rhythm, no murmurs, rubs, or gallops. Abdomen: Normoactive bowel sounds. Soft, non-distended, non-tender to palpation. No masses appreciated. Well healed pfannenstiel incision. Extremities: Grossly normal range of motion. Warm, well perfused. No edema bilaterally. Skin: No rashes or lesions. Lymphatic: No cervical, supraclavicular, or inguinal adenopathy. Genitourinary: Speculum exam with normal vaginal mucosa.  Cervix surgically absent.  Bimanual exam with smooth vaginal mucosa.  No pelvic mass or nodularity.  On external genitalia exam, inflamed erythematous and white area of the medial right labia majora involving the majority of the medial surface, and extending to the hairline of the labia majora.  Not quite approaching the vaginal mucosa. Exam chaperoned by Warner Mccreedy, NP  Physical Exam Genitourinary:    Purple indicates locations of biopsies (anterior right vulva, lateral right vulva, posterior right vulva, medial right vulva)   LABORATORY AND RADIOLOGIC DATA: Outside medical records were reviewed to synthesize the above history, along with the history and physical obtained during the visit.  Outside laboratory, pathology reports were reviewed, with pertinent results below.   WBC  Date Value Ref Range Status  11/22/2014 13.0 (H) 4.0 - 10.5 K/uL Final   Hemoglobin  Date Value Ref Range Status  11/22/2014 13.8 12.0 - 15.0 g/dL Final   HCT  Date Value Ref Range Status  11/22/2014 39.1 36.0 - 46.0 % Final   Platelets  Date Value Ref Range Status  11/22/2014 199 150 - 400 K/uL Final   Creatinine, Ser  Date Value Ref Range Status  11/22/2014 0.88 0.44 - 1.00 mg/dL Final   AST  Date Value Ref Range Status  11/22/2014 31 15 - 41 U/L Final   ALT  Date Value Ref Range Status  11/22/2014 55 (H) 14 - 54 U/L Final    Surgical pathology  (03/28/23): FINAL DIAGNOSIS       1. Skin, right labia majora :       EXTRAMAMMARY PAGET'S DISEASE, SEE DESCRIPTION  MICROSCOPIC DESCRIPTION 1. Sections show a punch biopsy of skin within the epidermis there is a proliferation of large epithelioid cells with abundant basophilic cytoplasm. The nuclei is large with an open chromatin.  There is extension of these cells to the follicular epithelium.  The dermis shows a perivascular predominately lymphocytic inflammatory infiltrate and fibroplasia.  Immunohistochemistry stains for CK7 highlight these cells.  The CK20, S100 and Melan-A stains are negative.  Multiple sections have been examined.   (MM:gt, 03/30/23)

## 2023-04-25 NOTE — Progress Notes (Signed)
Patient here for new patient consultation with Dr. Alvester Morin and for a pre-operative appointment prior to her scheduled surgery on 05/10/2023. She is scheduled for pelvic examination under anesthesia, wide local excision of the vulva. The surgery was discussed in detail.  See after visit summary for additional details.      Discussed post-op pain management in detail including the aspects of the enhanced recovery pathway.  Advised her that a new prescription would be sent in for tramadol and it is only to be used for after her upcoming surgery.  We discussed the use of tylenol post-op and to monitor for a maximum of 4,000 mg in a 24 hour period.  Also prescribed sennakot to be used after surgery and to hold if having loose stools.  Discussed bowel regimen in detail.     Discussed measures to take at home to prevent DVT including frequent mobility.  Reportable signs and symptoms of DVT discussed. Post-operative instructions discussed and expectations for after surgery. Incisional care discussed as well including reportable signs and symptoms including erythema, drainage, wound separation.     10 minutes spent preparing information and with the patient.  Verbalizing understanding of material discussed. No needs or concerns voiced at the end of the visit.   Advised patient to call for any needs.  Advised that her post-operative medications had been prescribed and could be picked up at any time.    This appointment is included in the global surgical bundle as pre-operative teaching and has no charge.

## 2023-04-26 LAB — CYTOLOGY - NON PAP

## 2023-04-28 ENCOUNTER — Encounter (HOSPITAL_BASED_OUTPATIENT_CLINIC_OR_DEPARTMENT_OTHER): Payer: Self-pay | Admitting: Psychiatry

## 2023-04-28 ENCOUNTER — Ambulatory Visit (HOSPITAL_COMMUNITY)
Admission: RE | Admit: 2023-04-28 | Discharge: 2023-04-28 | Disposition: A | Discharge status: Home / Self Care | Payer: Medicare Other | Source: Ambulatory Visit | Attending: Psychiatry | Admitting: Psychiatry

## 2023-04-28 ENCOUNTER — Other Ambulatory Visit: Payer: Self-pay

## 2023-04-28 DIAGNOSIS — C4499 Other specified malignant neoplasm of skin, unspecified: Secondary | ICD-10-CM

## 2023-04-28 DIAGNOSIS — Z90710 Acquired absence of both cervix and uterus: Secondary | ICD-10-CM | POA: Diagnosis not present

## 2023-04-28 LAB — SURGICAL PATHOLOGY

## 2023-04-28 NOTE — Progress Notes (Signed)
Spoke w/ via phone for pre-op interview: patient  Lab needs dos: EKG and I-Stat; no surgeon orders yet Lab results: NA COVID test: patient states asymptomatic no test needed. Arrive at 0830 05/10/23 NPO after MN except clear liquids. Clear liquids from MN until 0730 AM Med rec completed. Medications to take morning of surgery: esomeprazole, levothyroxine, and metoprolol Diabetic medication: none morning of surgery; hold Ozempic 1 week prior to sx Patient instructed no nail polish to be worn day of surgery. Patient instructed to bring photo id and insurance card day of surgery. Patient aware to have driver (ride ) / caregiver for 24 hours after surgery. husband to drive. Special Instructions: NA Patient verbalized understanding of instructions that were given at this phone interview. Patient denies shortness of breath, chest pain, fever, cough at this phone interview.  Patient has continuous glucose sensor- alternates between arms.

## 2023-05-04 ENCOUNTER — Encounter: Payer: Self-pay | Admitting: Geriatric Medicine

## 2023-05-09 ENCOUNTER — Telehealth: Payer: Self-pay

## 2023-05-09 NOTE — Telephone Encounter (Signed)
Telephone call to check on pre-operative status.  Patient compliant with pre-operative instructions.  Reinforced nothing to eat after midnight. Clear liquids until 0730. Patient to arrive at 0830.  No questions or concerns voiced.  Instructed to call for any needs.  Pt states she stopped the Ozempic 2 weeks ago.

## 2023-05-10 ENCOUNTER — Encounter (HOSPITAL_BASED_OUTPATIENT_CLINIC_OR_DEPARTMENT_OTHER)
Admission: RE | Disposition: A | Discharge status: Home / Self Care | Payer: Self-pay | Source: Ambulatory Visit | Attending: Psychiatry

## 2023-05-10 ENCOUNTER — Ambulatory Visit (HOSPITAL_BASED_OUTPATIENT_CLINIC_OR_DEPARTMENT_OTHER)
Admission: RE | Admit: 2023-05-10 | Discharge: 2023-05-10 | Disposition: A | Discharge status: Home / Self Care | Payer: Medicare Other | Source: Ambulatory Visit | Attending: Psychiatry | Admitting: Psychiatry

## 2023-05-10 ENCOUNTER — Ambulatory Visit (HOSPITAL_BASED_OUTPATIENT_CLINIC_OR_DEPARTMENT_OTHER): Payer: Medicare Other | Admitting: Anesthesiology

## 2023-05-10 ENCOUNTER — Ambulatory Visit (HOSPITAL_BASED_OUTPATIENT_CLINIC_OR_DEPARTMENT_OTHER): Payer: Self-pay | Admitting: Anesthesiology

## 2023-05-10 ENCOUNTER — Encounter (HOSPITAL_BASED_OUTPATIENT_CLINIC_OR_DEPARTMENT_OTHER): Payer: Self-pay | Admitting: Psychiatry

## 2023-05-10 ENCOUNTER — Other Ambulatory Visit: Payer: Self-pay

## 2023-05-10 DIAGNOSIS — E785 Hyperlipidemia, unspecified: Secondary | ICD-10-CM | POA: Diagnosis not present

## 2023-05-10 DIAGNOSIS — Z87891 Personal history of nicotine dependence: Secondary | ICD-10-CM | POA: Diagnosis not present

## 2023-05-10 DIAGNOSIS — C519 Malignant neoplasm of vulva, unspecified: Secondary | ICD-10-CM

## 2023-05-10 DIAGNOSIS — Z7985 Long-term (current) use of injectable non-insulin antidiabetic drugs: Secondary | ICD-10-CM | POA: Insufficient documentation

## 2023-05-10 DIAGNOSIS — E119 Type 2 diabetes mellitus without complications: Secondary | ICD-10-CM | POA: Diagnosis not present

## 2023-05-10 DIAGNOSIS — E079 Disorder of thyroid, unspecified: Secondary | ICD-10-CM | POA: Insufficient documentation

## 2023-05-10 DIAGNOSIS — Z79890 Hormone replacement therapy (postmenopausal): Secondary | ICD-10-CM | POA: Diagnosis not present

## 2023-05-10 DIAGNOSIS — M797 Fibromyalgia: Secondary | ICD-10-CM | POA: Insufficient documentation

## 2023-05-10 DIAGNOSIS — K219 Gastro-esophageal reflux disease without esophagitis: Secondary | ICD-10-CM | POA: Insufficient documentation

## 2023-05-10 DIAGNOSIS — Z7984 Long term (current) use of oral hypoglycemic drugs: Secondary | ICD-10-CM | POA: Insufficient documentation

## 2023-05-10 DIAGNOSIS — I10 Essential (primary) hypertension: Secondary | ICD-10-CM | POA: Insufficient documentation

## 2023-05-10 DIAGNOSIS — Z6832 Body mass index (BMI) 32.0-32.9, adult: Secondary | ICD-10-CM | POA: Diagnosis not present

## 2023-05-10 DIAGNOSIS — Z01818 Encounter for other preprocedural examination: Secondary | ICD-10-CM

## 2023-05-10 HISTORY — PX: VULVECTOMY: SHX1086

## 2023-05-10 LAB — POCT I-STAT, CHEM 8
BUN: 14 mg/dL (ref 8–23)
Calcium, Ion: 1.24 mmol/L (ref 1.15–1.40)
Chloride: 101 mmol/L (ref 98–111)
Creatinine, Ser: 0.9 mg/dL (ref 0.44–1.00)
Glucose, Bld: 151 mg/dL — ABNORMAL HIGH (ref 70–99)
HCT: 30 % — ABNORMAL LOW (ref 36.0–46.0)
Hemoglobin: 10.2 g/dL — ABNORMAL LOW (ref 12.0–15.0)
Potassium: 4.2 mmol/L (ref 3.5–5.1)
Sodium: 138 mmol/L (ref 135–145)
TCO2: 24 mmol/L (ref 22–32)

## 2023-05-10 LAB — GLUCOSE, CAPILLARY: Glucose-Capillary: 124 mg/dL — ABNORMAL HIGH (ref 70–99)

## 2023-05-10 SURGERY — EXAM UNDER ANESTHESIA
Anesthesia: General | Site: Vulva

## 2023-05-10 MED ORDER — PROPOFOL 10 MG/ML IV BOLUS
INTRAVENOUS | Status: DC | PRN
  Administered 2023-05-10: 130 mg via INTRAVENOUS

## 2023-05-10 MED ORDER — LACTATED RINGERS IV SOLN: INTRAVENOUS | Status: DC

## 2023-05-10 MED ORDER — DEXAMETHASONE SODIUM PHOSPHATE 10 MG/ML IJ SOLN
INTRAMUSCULAR | Status: DC | PRN
  Administered 2023-05-10: 5 mg via INTRAVENOUS

## 2023-05-10 MED ORDER — PROPOFOL 10 MG/ML IV BOLUS
INTRAVENOUS | Status: AC
  Filled 2023-05-10: qty 20

## 2023-05-10 MED ORDER — DEXAMETHASONE SODIUM PHOSPHATE 10 MG/ML IJ SOLN: 4.0000 mg | INTRAMUSCULAR | Status: DC

## 2023-05-10 MED ORDER — ACETAMINOPHEN 160 MG/5ML PO SOLN: 325.0000 mg | ORAL | Status: DC | PRN

## 2023-05-10 MED ORDER — BUPIVACAINE HCL 0.25 % IJ SOLN
INTRAMUSCULAR | Status: DC | PRN
  Administered 2023-05-10: 20 mL

## 2023-05-10 MED ORDER — EPHEDRINE 5 MG/ML INJ
INTRAVENOUS | Status: AC
  Filled 2023-05-10: qty 5

## 2023-05-10 MED ORDER — SODIUM CHLORIDE 0.9 % IV SOLN: INTRAVENOUS | Status: DC | PRN

## 2023-05-10 MED ORDER — FENTANYL CITRATE (PF) 100 MCG/2ML IJ SOLN
INTRAMUSCULAR | Status: AC
  Filled 2023-05-10: qty 2

## 2023-05-10 MED ORDER — ONDANSETRON HCL 4 MG/2ML IJ SOLN
INTRAMUSCULAR | Status: DC | PRN
  Administered 2023-05-10: 4 mg via INTRAVENOUS

## 2023-05-10 MED ORDER — 0.9 % SODIUM CHLORIDE (POUR BTL) OPTIME
TOPICAL | Status: DC | PRN
  Administered 2023-05-10: 500 mL

## 2023-05-10 MED ORDER — FLUORESCEIN SODIUM 10 % IV SOLN
INTRAVENOUS | Status: AC
  Filled 2023-05-10: qty 5

## 2023-05-10 MED ORDER — LIDOCAINE 2% (20 MG/ML) 5 ML SYRINGE
INTRAMUSCULAR | Status: DC | PRN
  Administered 2023-05-10: 400 mg via INTRAVENOUS

## 2023-05-10 MED ORDER — ONDANSETRON HCL 4 MG/2ML IJ SOLN: 4.0000 mg | Freq: Once | INTRAMUSCULAR | Status: DC | PRN

## 2023-05-10 MED ORDER — STERILE WATER FOR IRRIGATION IR SOLN
Status: DC | PRN
  Administered 2023-05-10: 500 mL

## 2023-05-10 MED ORDER — LIDOCAINE HCL (PF) 2 % IJ SOLN
INTRAMUSCULAR | Status: AC
Start: 2023-05-10 — End: ?
  Filled 2023-05-10: qty 5

## 2023-05-10 MED ORDER — HEPARIN SODIUM (PORCINE) 5000 UNIT/ML IJ SOLN
INTRAMUSCULAR | Status: AC
Start: 2023-05-10 — End: ?
  Filled 2023-05-10: qty 1

## 2023-05-10 MED ORDER — FENTANYL CITRATE (PF) 100 MCG/2ML IJ SOLN
INTRAMUSCULAR | Status: DC | PRN
  Administered 2023-05-10: 25 ug via INTRAVENOUS
  Administered 2023-05-10: 50 ug via INTRAVENOUS
  Administered 2023-05-10: 25 ug via INTRAVENOUS
  Administered 2023-05-10 (×2): 50 ug via INTRAVENOUS

## 2023-05-10 MED ORDER — SODIUM CHLORIDE 0.9 % IV SOLN: INTRAVENOUS | Status: DC

## 2023-05-10 MED ORDER — MIDAZOLAM HCL 2 MG/2ML IJ SOLN
INTRAMUSCULAR | Status: AC
Start: 2023-05-10 — End: ?
  Filled 2023-05-10: qty 2

## 2023-05-10 MED ORDER — EPHEDRINE SULFATE-NACL 50-0.9 MG/10ML-% IV SOSY
PREFILLED_SYRINGE | INTRAVENOUS | Status: DC | PRN
  Administered 2023-05-10 (×2): 5 mg via INTRAVENOUS

## 2023-05-10 MED ORDER — ACETAMINOPHEN 500 MG PO TABS
1000.0000 mg | ORAL_TABLET | ORAL | Status: AC
  Administered 2023-05-10: 1000 mg via ORAL

## 2023-05-10 MED ORDER — HEPARIN SODIUM (PORCINE) 5000 UNIT/ML IJ SOLN
5000.0000 [IU] | INTRAMUSCULAR | Status: AC
  Administered 2023-05-10: 5000 [IU] via SUBCUTANEOUS

## 2023-05-10 MED ORDER — ACETAMINOPHEN 325 MG PO TABS: 325.0000 mg | ORAL_TABLET | ORAL | Status: DC | PRN

## 2023-05-10 MED ORDER — ACETAMINOPHEN 500 MG PO TABS
ORAL_TABLET | ORAL | Status: AC
Start: 2023-05-10 — End: ?
  Filled 2023-05-10: qty 2

## 2023-05-10 MED ORDER — MEPERIDINE HCL 25 MG/ML IJ SOLN: 6.2500 mg | INTRAMUSCULAR | Status: DC | PRN

## 2023-05-10 MED ORDER — MIDAZOLAM HCL 2 MG/2ML IJ SOLN
INTRAMUSCULAR | Status: DC | PRN
  Administered 2023-05-10: 2 mg via INTRAVENOUS

## 2023-05-10 MED ORDER — OXYCODONE HCL 5 MG PO TABS: 5.0000 mg | ORAL_TABLET | Freq: Once | ORAL | Status: DC | PRN

## 2023-05-10 MED ORDER — OXYCODONE HCL 5 MG/5ML PO SOLN: 5.0000 mg | Freq: Once | ORAL | Status: DC | PRN

## 2023-05-10 MED ORDER — FENTANYL CITRATE (PF) 100 MCG/2ML IJ SOLN: 25.0000 ug | INTRAMUSCULAR | Status: DC | PRN

## 2023-05-10 SURGICAL SUPPLY — 35 items
BLADE CLIPPER SENSICLIP SURGIC (BLADE) IMPLANT
BLADE SURG 11 STRL SS (BLADE) IMPLANT
BLADE SURG 15 STRL LF DISP TIS (BLADE) ×2 IMPLANT
CATH ROBINSON RED A/P 16FR (CATHETERS) ×2 IMPLANT
DRSG TELFA 3X8 NADH STRL (GAUZE/BANDAGES/DRESSINGS) IMPLANT
GAUZE 4X4 16PLY ~~LOC~~+RFID DBL (SPONGE) IMPLANT
GLOVE BIO SURGEON STRL SZ 6 (GLOVE) ×4 IMPLANT
GLOVE BIOGEL PI IND STRL 6.5 (GLOVE) ×2 IMPLANT
GOWN STRL REUS W/TWL LRG LVL3 (GOWN DISPOSABLE) ×2 IMPLANT
HEMOSTAT SURGICEL 2X14 (HEMOSTASIS) IMPLANT
HIBICLENS CHG 4% 4OZ BTL (MISCELLANEOUS) ×2 IMPLANT
KIT TURNOVER CYSTO (KITS) ×2 IMPLANT
LEGGING LITHOTOMY PAIR STRL (DRAPES) ×2 IMPLANT
NDL HYPO 25X1 1.5 SAFETY (NEEDLE) IMPLANT
NEEDLE HYPO 25X1 1.5 SAFETY (NEEDLE) ×2
NS IRRIG 500ML POUR BTL (IV SOLUTION) IMPLANT
PACK PERINEAL COLD (PAD) ×2 IMPLANT
PACK VAGINAL WOMENS (CUSTOM PROCEDURE TRAY) ×2 IMPLANT
PUNCH BIOPSY DERMAL 3 (INSTRUMENTS) IMPLANT
PUNCH BIOPSY DERMAL 4MM (INSTRUMENTS) IMPLANT
SLEEVE SCD COMPRESS KNEE MED (STOCKING) ×2 IMPLANT
SPIKE FLUID TRANSFER (MISCELLANEOUS) IMPLANT
SUT VIC AB 0 SH 27 (SUTURE) ×2 IMPLANT
SUT VIC AB 2-0 CT1 TAPERPNT 27 (SUTURE) IMPLANT
SUT VIC AB 2-0 CT2 27 (SUTURE) IMPLANT
SUT VIC AB 2-0 SH 27XBRD (SUTURE) IMPLANT
SUT VIC AB 3-0 SH 27X BRD (SUTURE) ×2 IMPLANT
SUT VIC AB 4-0 PS2 18 (SUTURE) ×2 IMPLANT
SUT VIC AB 4-0 SH 27XANBCTRL (SUTURE) IMPLANT
SWAB OB GYN 8IN STERILE 2PK (MISCELLANEOUS) IMPLANT
SYR 3ML 23GX1 SAFETY (SYRINGE) ×2 IMPLANT
SYR BULB IRRIG 60ML STRL (SYRINGE) ×2 IMPLANT
TOWEL OR 17X24 6PK STRL BLUE (TOWEL DISPOSABLE) ×2 IMPLANT
WATER STERILE IRR 1000ML POUR (IV SOLUTION) ×2 IMPLANT
WATER STERILE IRR 500ML POUR (IV SOLUTION) IMPLANT

## 2023-05-10 NOTE — Op Note (Signed)
GYNECOLOGIC ONCOLOGY OPERATIVE NOTE  Date of Service: 05/10/2023  Preoperative Diagnosis: Paget's disease of the vulva  Postoperative Diagnosis: Same  Procedures: Simple partial vulvectomy  Surgeon: Clide Cliff, MD  Assistants: None  Anesthesia: General  Estimated Blood Loss: 100 mL  Fluids: 350 ml, crystalloid  Urine Output: 30ml, clear yellow  Findings: External genitalia with area of whitening and thickening of the majority of the right labia majora, extending towards the introitus.  Prior biopsy sites visible and healing.  No additional findings.  Specimens:  ID Type Source Tests Collected by Time Destination  1 : Right Vulva stitch at 12  o'clock Tissue PATH Gyn biopsy SURGICAL PATHOLOGY Clide Cliff, MD 05/10/2023 1045     Complications:  None  Indications for Procedure: Joan Vargas is a 67 y.o. woman with Paget's disease of the vulva.  Prior to the procedure, all risks, benefits, and alternatives were discussed and informed surgical consent was signed.  Procedure: Patient was taken to the operating room where general anesthesia was achieved.  She was positioned in dorsal lithotomy and prepped and draped.   A thorough exam of the vulva was completed with the findings as noted above. The area to be removed was outlined with a marking pen.  The prior scouting biopsies at the anterior, lateral, and posterior were negative for Paget's disease.  The medial margin was positive.  Outline extended medial of the prior biopsy site to try to obtain a negative margin.  A scalpel was used to incise around the lesion. Allis clamps are used to grasp the lesion and a skinning vulvectomy was performed in the standard fashion with aid of cautery. After the lesion was removed, the wound bed was made hemostatic with cautery. Several deep 2-0 and 3-0 vicryl suture was placed to bring the wound edges together followed by 4-0 vicryl on the skin edges in vertical mattress fashion to  re-appoximate the wound. Hemostasis was noted at the end of the procedure.  0.25% percent Marcaine was injected.  The urethra was cleansed with Betadine and an In-N-Out catheterization was performed.  Patient tolerated the procedure well. Sponge, lap, and instrument counts were correct.  No perioperative antibiotics were indicated for this procedure.  Patient was extubated and taken to the PACU in stable condition.  Clide Cliff, MD Gynecologic Oncology

## 2023-05-10 NOTE — Progress Notes (Signed)
Very alert and oriented, mae x 4, ambulated to restroom without difficulty, min assistance required, voided without difficulty x 2

## 2023-05-10 NOTE — Transfer of Care (Signed)
Immediate Anesthesia Transfer of Care Note  Patient: Joan Vargas  Procedure(s) Performed: Procedure(s) (LRB): EXAM UNDER ANESTHESIA (N/A) WIDE EXCISION VULVECTOMY (N/A)  Patient Location: PACU  Anesthesia Type: General  Level of Consciousness: awake, oriented, sedated and patient cooperative  Airway & Oxygen Therapy: Patient Spontanous Breathing and Patient connected to face mask oxygen  Post-op Assessment: Report given to PACU RN and Post -op Vital signs reviewed and stable  Post vital signs: Reviewed and stable  Complications: No apparent anesthesia complications Last Vitals:  Vitals Value Taken Time  BP    Temp    Pulse 78 05/10/23 1127  Resp 19 05/10/23 1127  SpO2 100 % 05/10/23 1127  Vitals shown include unfiled device data.  Last Pain:  Vitals:   05/10/23 0852  TempSrc: Oral  PainSc: 3       Patients Stated Pain Goal: 3 (05/10/23 9811)  Complications: No notable events documented.

## 2023-05-10 NOTE — Anesthesia Procedure Notes (Signed)
Procedure Name: LMA Insertion Date/Time: 05/10/2023 10:23 AM  Performed by: Francie Massing, CRNAPre-anesthesia Checklist: Patient identified, Emergency Drugs available, Suction available and Patient being monitored Patient Re-evaluated:Patient Re-evaluated prior to induction Oxygen Delivery Method: Circle system utilized Preoxygenation: Pre-oxygenation with 100% oxygen Induction Type: IV induction Ventilation: Mask ventilation without difficulty LMA: LMA inserted LMA Size: 4.0 Number of attempts: 1 Airway Equipment and Method: Bite block Placement Confirmation: positive ETCO2 Tube secured with: Tape Dental Injury: Teeth and Oropharynx as per pre-operative assessment

## 2023-05-10 NOTE — Anesthesia Preprocedure Evaluation (Signed)
Anesthesia Evaluation  Patient identified by MRN, date of birth, ID band Patient awake    Reviewed: Allergy & Precautions, H&P , NPO status , Patient's Chart, lab work & pertinent test results  History of Anesthesia Complications (+) PONV and history of anesthetic complications  Airway Mallampati: I  TM Distance: >3 FB Neck ROM: Full    Dental  (+) Dental Advisory Given, Edentulous Upper, Edentulous Lower   Pulmonary former smoker   breath sounds clear to auscultation       Cardiovascular hypertension, Pt. on medications  Rhythm:Regular Rate:Normal     Neuro/Psych  Headaches PSYCHIATRIC DISORDERS Anxiety Depression     Neuromuscular disease    GI/Hepatic ,GERD  Medicated and Controlled,,  Endo/Other  diabetes, Well Controlled, Type 2, Oral Hypoglycemic AgentsHypothyroidism  Class 3 obesity  Renal/GU      Musculoskeletal  (+) Arthritis ,  Fibromyalgia -  Abdominal   Peds  Hematology  (+) Blood dyscrasia, anemia   Anesthesia Other Findings   Reproductive/Obstetrics                             Anesthesia Physical Anesthesia Plan  ASA: 3  Anesthesia Plan: General   Post-op Pain Management: Minimal or no pain anticipated   Induction: Intravenous  PONV Risk Score and Plan: 4 or greater and Ondansetron, Dexamethasone and Treatment may vary due to age or medical condition  Airway Management Planned: LMA and Mask  Additional Equipment: None  Intra-op Plan:   Post-operative Plan: Extubation in OR  Informed Consent: I have reviewed the patients History and Physical, chart, labs and discussed the procedure including the risks, benefits and alternatives for the proposed anesthesia with the patient or authorized representative who has indicated his/her understanding and acceptance.     Dental advisory given  Plan Discussed with: CRNA, Anesthesiologist and Surgeon  Anesthesia Plan  Comments: ( )        Anesthesia Quick Evaluation

## 2023-05-10 NOTE — Interval H&P Note (Signed)
History and Physical Interval Note:  05/10/2023 9:39 AM  Joan Vargas  has presented today for surgery, with the diagnosis of PAGETS OF THE VULVA.  The various methods of treatment have been discussed with the patient and family. After consideration of risks, benefits and other options for treatment, the patient has consented to  Procedure(s): EXAM UNDER ANESTHESIA (N/A) WIDE EXCISION VULVECTOMY (N/A) as a surgical intervention.  The patient's history has been reviewed, patient examined, no change in status, stable for surgery.  I have reviewed the patient's chart and labs.  Questions were answered to the patient's satisfaction.     [name]

## 2023-05-10 NOTE — Anesthesia Postprocedure Evaluation (Signed)
Anesthesia Post Note  Patient: Joan Vargas  Procedure(s) Performed: Francia Greaves UNDER ANESTHESIA (Perineum) WIDE EXCISION VULVECTOMY (Vulva)     Patient location during evaluation: PACU Anesthesia Type: General Level of consciousness: awake and alert Pain management: pain level controlled Vital Signs Assessment: post-procedure vital signs reviewed and stable Respiratory status: spontaneous breathing, nonlabored ventilation, respiratory function stable and patient connected to nasal cannula oxygen Cardiovascular status: blood pressure returned to baseline and stable Postop Assessment: no apparent nausea or vomiting Anesthetic complications: no  No notable events documented.  Last Vitals:  Vitals:   05/10/23 1123 05/10/23 1145  BP: 101/67   Pulse: 76   Resp: 20   Temp: 36.6 C 36.6 C  SpO2: 100%     Last Pain:  Vitals:   05/10/23 1145  TempSrc:   PainSc: 0-No pain                 Lavada Langsam

## 2023-05-10 NOTE — Discharge Instructions (Addendum)
AFTER SURGERY INSTRUCTIONS   Return to work:  variable based on occupation  We recommend purchasing several bags of frozen green peas and dividing them into ziploc bags. You will want to keep these in the freezer and have them ready to use as ice packs to the vulvar incision. Once the ice pack is no longer cold, you can get another from the freezer. The frozen peas mold to your body better than a regular ice pack.    Activity: 1. Be up and out of the bed during the day.  Take a nap if needed.  You may walk up steps but be careful and use the hand rail.  Stair climbing will tire you more than you think, you may need to stop part way and rest.   2. No lifting or straining for 2 weeks over 10 pounds, ideally 4 weeks. No pushing, pulling, straining for 2 weeks.   3. No driving for minimum 24 hours after surgery but this is usually longer.  Do not drive if you are taking narcotic pain medicine and make sure that your reaction time has returned.    4. You can shower as soon as the next day after surgery. Shower daily. No tub baths or submerging your body in water until cleared by your surgeon. If you have the soap that was given to you by pre-surgical testing that was used before surgery, you do not need to use it afterwards because this can irritate your incisions.    5. No sexual activity and nothing in the vagina for 4 weeks.   6. You may experience vulvar spotting and discharge after surgery.  The spotting is normal but if you experience heavy bleeding, call our office.   7. Take Tylenol or ibuprofen first for pain if you are able to take these medications and only use narcotic pain medication for severe pain not relieved by the Tylenol or Ibuprofen.  Monitor your Tylenol intake to a max of 4,000 mg in a 24 hour period. You can alternate these medications after surgery.  8. You can resume your aspirin tomorrow (11/27).  9. Do not apply clobetasol cream to the vulvar wound.   Diet: 1. Low  sodium Heart Healthy Diet is recommended but you are cleared to resume your normal (before surgery) diet after your procedure.   2. It is safe to use a laxative, such as Miralax or Colace, if you have difficulty moving your bowels. You have been prescribed Sennakot at bedtime every evening to keep bowel movements regular and to prevent constipation.     Wound Care: 1. Keep clean and dry.  Shower daily.   Reasons to call the Doctor: Fever - Oral temperature greater than 100.4 degrees Fahrenheit Foul-smelling vaginal discharge Difficulty urinating Nausea and vomiting Increased pain at the site of the incision that is unrelieved with pain medicine. Difficulty breathing with or without chest pain New calf pain especially if only on one side Sudden, continuing increased vaginal bleeding with or without clots.   Contacts: For questions or concerns you should contact:   Dr. Clide Cliff at [phone]   Warner Mccreedy, NP at [phone]   After Hours: call [phone] and have the GYN Oncologist paged/contacted (after 5 pm or on the weekends).   Messages sent via mychart are for non-urgent matters and are not responded to after hours so for urgent needs, please call the after hours number.

## 2023-05-10 NOTE — Anesthesia Postprocedure Evaluation (Signed)
Anesthesia Post Note  Patient: Joan Vargas  Procedure(s) Performed: Francia Greaves UNDER ANESTHESIA (Perineum) WIDE EXCISION VULVECTOMY (Vulva)     Patient location during evaluation: PACU Anesthesia Type: General Level of consciousness: awake and alert Pain management: pain level controlled Vital Signs Assessment: post-procedure vital signs reviewed and stable Respiratory status: spontaneous breathing, nonlabored ventilation, respiratory function stable and patient connected to nasal cannula oxygen Cardiovascular status: blood pressure returned to baseline and stable Postop Assessment: no apparent nausea or vomiting Anesthetic complications: no  No notable events documented.  Last Vitals:  Vitals:   05/10/23 1123 05/10/23 1145  BP: 101/67   Pulse: 76   Resp: 20   Temp: 36.6 C 36.6 C  SpO2: 100%     Last Pain:  Vitals:   05/10/23 1145  TempSrc:   PainSc: 0-No pain                 Gar Glance

## 2023-05-11 ENCOUNTER — Telehealth: Payer: Self-pay | Admitting: *Deleted

## 2023-05-11 NOTE — Telephone Encounter (Signed)
Spoke with Joan Vargas this morning. She states she is eating, drinking and urinating well. She has not had a BM yet but is passing gas. She is taking senokot as prescribed and encouraged her to drink plenty of water. She denies fever or chills. Incisions are dry and intact. She rates her pain 3/10. Her pain is controlled with tylenol.    Instructed to call office with any fever, chills, purulent drainage, uncontrolled pain or any other questions or concerns. Patient verbalizes understanding.   Pt aware of post op appointments as well as the office number [phone] and after hours number [phone] to call if she has any questions or concerns

## 2023-05-16 ENCOUNTER — Encounter (HOSPITAL_BASED_OUTPATIENT_CLINIC_OR_DEPARTMENT_OTHER): Payer: Self-pay | Admitting: Psychiatry

## 2023-05-18 LAB — SURGICAL PATHOLOGY

## 2023-05-19 DIAGNOSIS — H524 Presbyopia: Secondary | ICD-10-CM | POA: Diagnosis not present

## 2023-05-23 ENCOUNTER — Inpatient Hospital Stay: Payer: Medicare Other

## 2023-05-23 ENCOUNTER — Inpatient Hospital Stay: Payer: Medicare Other | Attending: Psychiatry | Admitting: Psychiatry

## 2023-05-23 ENCOUNTER — Telehealth: Payer: Self-pay | Admitting: Psychiatry

## 2023-05-23 VITALS — BP 126/60 | HR 87 | Temp 98.7°F | Resp 20 | Wt 172.0 lb

## 2023-05-23 DIAGNOSIS — Z7189 Other specified counseling: Secondary | ICD-10-CM

## 2023-05-23 DIAGNOSIS — Z9079 Acquired absence of other genital organ(s): Secondary | ICD-10-CM | POA: Diagnosis not present

## 2023-05-23 DIAGNOSIS — C519 Malignant neoplasm of vulva, unspecified: Secondary | ICD-10-CM | POA: Insufficient documentation

## 2023-05-23 DIAGNOSIS — C4499 Other specified malignant neoplasm of skin, unspecified: Secondary | ICD-10-CM

## 2023-05-23 DIAGNOSIS — R300 Dysuria: Secondary | ICD-10-CM | POA: Insufficient documentation

## 2023-05-23 DIAGNOSIS — G8918 Other acute postprocedural pain: Secondary | ICD-10-CM | POA: Insufficient documentation

## 2023-05-23 LAB — URINALYSIS, COMPLETE (UACMP) WITH MICROSCOPIC
Bilirubin Urine: NEGATIVE
Glucose, UA: NEGATIVE mg/dL
Hgb urine dipstick: NEGATIVE
Ketones, ur: NEGATIVE mg/dL
Nitrite: NEGATIVE
Protein, ur: 30 mg/dL — AB
Specific Gravity, Urine: 1.012 (ref 1.005–1.030)
WBC, UA: >50 WBC/hpf (ref 0–5)
pH: 5 (ref 5.0–8.0)

## 2023-05-23 MED ORDER — OXYCODONE HCL 5 MG PO TABS: 5.0000 mg | ORAL_TABLET | ORAL | 0 refills | Status: DC | PRN

## 2023-05-23 MED ORDER — NITROFURANTOIN MONOHYD MACRO 100 MG PO CAPS: 100.0000 mg | ORAL_CAPSULE | Freq: Two times a day (BID) | ORAL | 0 refills | Status: AC

## 2023-05-23 NOTE — Progress Notes (Signed)
Gynecologic Oncology Return Clinic Visit  Date of Service: 05/23/2023 Referring Provider: Langston Reusing, MD   Assessment & Plan: Joan Vargas is a 67 y.o. woman with noninvasive extramammary Paget's disease of the vulva who is s/p simple partial vulvectomy on 05/10/23, with negative margins.  Postop: - Pt recovering well from surgery and healing appropriately postoperatively - Intraoperative findings and pathology results reviewed. - Ongoing postoperative expectations and precautions reviewed.  - Few additional tabs of oxycodone sent for pain control. - Okay to perform sitz baths at this time.  Dysuria: - UA sent.  - Positive leukocytes, >50 WBC, many bacteria - Rx for macrobid sent. If no improvement, may need to submit urine culture for sensitivities.   Paget's disease of the vulva: - Pathology results reviewed in detail - No invasion, negative margins. - Recommend observation. - Signs/symptoms of recurrence reviewed. - Surveillance reviewed. Follow-up q6 months x2 years then yearly.   RTC 1 month.  Clide Cliff, MD Gynecologic Oncology   Medical Decision Making I personally spent  TOTAL 20 minutes face-to-face and non-face-to-face in the care of this patient, which includes all pre, intra, and post visit time on the date of service. The discussion of diagnosis and management of paget's disease of the vulva is beyond the scope of routine postoperative care.   ----------------------- Reason for Visit: Postop/Treatment Counseling  Treatment History: Oncology History  Paget's disease of vulva (HCC)  03/28/2023 Initial Biopsy   1. Skin, right labia majora :       EXTRAMAMMARY PAGET'S DISEASE, SEE DESCRIPTION  MICROSCOPIC DESCRIPTION 1. Sections show a punch biopsy of skin within the epidermis there is a proliferation of large epithelioid cells with abundant basophilic cytoplasm. The nuclei is large with an open chromatin.  There is extension of these cells to the  follicular epithelium.  The dermis shows a perivascular predominately lymphocytic inflammatory infiltrate and fibroplasia.  Immunohistochemistry stains for CK7 highlight these cells.  The CK20, S100 and Melan-A stains are negative.  Multiple sections have been examined.   (MM:gt, 03/30/23)    03/28/2023 Initial Diagnosis   Paget's disease of vulva (HCC)   05/10/2023 Surgery   Procedures: Simple partial vulvectomy  Findings: External genitalia with area of whitening and thickening of the majority of the right labia majora, extending towards the introitus.  Prior biopsy sites visible and healing.  No additional findings.    05/10/2023 Pathology Results   A. VULVA, RIGHT, VULVECTOMY:      Extramammary Paget disease.      Surgical margins of resection are negative for Paget disease.      See oncology table.  ONCOLOGY TABLE:  VULVA, CARCINOMA: Resection  Procedure: Vulvectomy Tumor Focality: Unifocal Tumor Site: Vulva, not otherwise specified Tumor Size: Cannot be determined Histologic Type: Extramammary Paget disease Histologic Grade: NA Depth of Invasion:      Specify depth of invasion (mm): NA, no invasion identified Other Tissue/ Organ Involvement: Not applicable Lymphovascular Invasion: Not identified Margins:      Margins Involved by Invasive Carcinoma: All margins negative for Paget disease      Margin Status for HSIL or dVIN: NA Regional Lymph Nodes Not applicable (no lymph nodes submitted or found)]           Lymph Nodes Examined:                                   [0] Sentinel                                   [  0] Non-sentinel                                   [0] Total           Number of Nodes with Metastasis 5 mm or Greater: NA           Number of Nodes with Metastasis Less than 5 mm (excludes isolated tumor cells): NA           Number of Nodes with Isolated Tumor Cells (0.2 mm or less): NA           Additional Lymph Node Findings: NA Distant Metastasis:       Distant Site(s) Involved: Not applicable Pathologic Stage Classification (pTNM, AJCC 8th Edition): pTis, pNx Ancillary Studies: Immunohistochemical stain for CK7 on A1 is negative. Representative Tumor Block: A3, A6 Comment(s): None      Interval History: Pt reports that she is recovering well from surgery. She is using tylenol for pain. She is eating and drinking well. She is voiding without issue and having regular bowel movements. Some pain with urination, some urinary frequency (unchanged since surgery). Wants something stronger than tramadol. Some spotting.  Past Medical/Surgical History: Past Medical History:  Diagnosis Date   Allergy    Anemia    Anxiety    Arthritis    knees and thumbs    Depression    Diabetes mellitus    diet controlled not on meds    Fibromyalgia    GERD (gastroesophageal reflux disease)    Headache(784.0)    Hyperlipidemia    Hypertension    PONV (postoperative nausea and vomiting)    Restless leg    Thyroid disease     Past Surgical History:  Procedure Laterality Date   ABDOMINAL HYSTERECTOMY  06/14/1984   APPENDECTOMY  06/14/1954   BREAST BIOPSY Left 06/23/2004   CESAREAN SECTION  1984, 1986   KNEE ARTHROSCOPY  06/14/1998   right   OOPHORECTOMY Right    possible cyst on ovary   THYROIDECTOMY  11/04/2011   Procedure: THYROIDECTOMY;  Surgeon: Velora Heckler, MD;  Location: [facility];  Service: General;  Laterality: N/A;  Total Thyroidectomy   TOTAL KNEE ARTHROPLASTY Right 11/09/2013   Procedure: RIGHT TOTAL KNEE ARTHROPLASTY;  Surgeon: Verlee Rossetti, MD;  Location: [facility] OR;  Service: Orthopedics;  Laterality: Right;   VULVECTOMY N/A 05/10/2023   Procedure: WIDE EXCISION VULVECTOMY;  Surgeon: Clide Cliff, MD;  Location: [facility];  Service: Gynecology;  Laterality: N/A;    Family History  Problem Relation Age of Onset   Diabetes Mother    Colon cancer Neg Hx    Stomach cancer Neg Hx    Esophageal cancer Neg Hx     Rectal cancer Neg Hx    Colon polyps Neg Hx    Breast cancer Neg Hx    Ovarian cancer Neg Hx    Endometrial cancer Neg Hx     Social History   Socioeconomic History   Marital status: Married    Spouse name: Not on file   Number of children: Not on file   Years of education: Not on file   Highest education level: Not on file  Occupational History   Not on file  Tobacco Use   Smoking status: Former    Current packs/day: 0.00    Average packs/day: 0.5 packs/day for 5.0 years (2.5 ttl pk-yrs)  Types: Cigarettes    Start date: 06/14/1976    Quit date: 06/14/1981    Years since quitting: 41.9   Smokeless tobacco: Never  Vaping Use   Vaping status: Never Used  Substance and Sexual Activity   Alcohol use: No    Comment: occasional   Drug use: No   Sexual activity: Not Currently  Other Topics Concern   Not on file  Social History Narrative   Not on file   Social Drivers of Health   Financial Resource Strain: Not on file  Food Insecurity: No Food Insecurity (04/21/2023)   Hunger Vital Sign    Worried About Running Out of Food in the Last Year: Never true    Ran Out of Food in the Last Year: Never true  Transportation Needs: No Transportation Needs (04/21/2023)   PRAPARE - Administrator, Civil Service (Medical): No    Lack of Transportation (Non-Medical): No  Physical Activity: Not on file  Stress: Not on file  Social Connections: Not on file    Current Medications:  Current Outpatient Medications:    aspirin 81 MG tablet, Take 81 mg by mouth at bedtime. , Disp: , Rfl:    Continuous Glucose Sensor (DEXCOM G7 SENSOR) MISC, CHANGE SENSOR EVERY 10 DAYS, Disp: , Rfl:    D2000 ULTRA STRENGTH 50 MCG (2000 UT) CAPS, Take 1 capsule by mouth at bedtime., Disp: , Rfl:    esomeprazole (NEXIUM) 20 MG capsule, Take 40 mg by mouth daily., Disp: , Rfl:    fluticasone (FLONASE) 50 MCG/ACT nasal spray, Place 1 spray into both nostrils at bedtime. , Disp: , Rfl:    gabapentin  (NEURONTIN) 300 MG capsule, Take 300 mg by mouth at bedtime., Disp: , Rfl:    Glucosamine-Chondroitin (MOVE FREE PO), Take 1 capsule by mouth every morning., Disp: , Rfl:    levothyroxine (SYNTHROID) 150 MCG tablet, Take 150 mcg by mouth daily., Disp: , Rfl:    metFORMIN (GLUCOPHAGE) 1000 MG tablet, Take 1,000 mg by mouth 2 (two) times daily with a meal., Disp: , Rfl:    metoprolol succinate (TOPROL-XL) 50 MG 24 hr tablet, Take 50 mg by mouth every morning. Take with or immediately following a meal., Disp: , Rfl:    montelukast (SINGULAIR) 10 MG tablet, Take 10 mg by mouth at bedtime., Disp: , Rfl:    Multiple Vitamin (MULTIVITAMIN) tablet, Take 1 tablet by mouth every morning. , Disp: , Rfl:    olmesartan-hydrochlorothiazide (BENICAR HCT) 40-12.5 MG tablet, Take 1 tablet by mouth daily., Disp: , Rfl:    OVER THE COUNTER MEDICATION, [employer] Allergy 25 mg. One tablet at night., Disp: , Rfl:    OVER THE COUNTER MEDICATION, Tumeric two tablets one time daily., Disp: , Rfl:    OVER THE COUNTER MEDICATION, Biotin 10, 000 mcg one tablet daily., Disp: , Rfl:    oxyCODONE (OXY IR/ROXICODONE) 5 MG immediate release tablet, Take 1 tablet (5 mg total) by mouth every 4 (four) hours as needed for severe pain (pain score 7-10)., Disp: 15 tablet, Rfl: 0   OZEMPIC, 0.25 OR 0.5 MG/DOSE, 2 MG/3ML SOPN, SMARTSIG:0.45 Milligram(s) SUB-Q Once a Week, Disp: , Rfl:    pramipexole (MIRAPEX) 0.25 MG tablet, Take 0.25 mg by mouth at bedtime., Disp: , Rfl:    Probiotic Product (PROBIOTIC DAILY PO), Take by mouth daily., Disp: , Rfl:    rosuvastatin (CRESTOR) 5 MG tablet, Take 5 mg by mouth every other day., Disp: , Rfl:  senna-docusate (SENOKOT-S) 8.6-50 MG tablet, Take 2 tablets by mouth at bedtime. For AFTER surgery, do not take if having diarrhea, Disp: 30 tablet, Rfl: 0   tiZANidine (ZANAFLEX) 4 MG tablet, Take 4 mg by mouth every 6 (six) hours as needed for muscle spasms., Disp: , Rfl:    traMADol (ULTRAM) 50 MG  tablet, Take 1 tablet (50 mg total) by mouth every 6 (six) hours as needed for severe pain (pain score 7-10). For AFTER surgery only, do not take and drive, Disp: 10 tablet, Rfl: 0   Vibegron (GEMTESA) 75 MG TABS, Take by mouth at bedtime., Disp: , Rfl:    clobetasol cream (TEMOVATE) 0.05 %, Apply twice daily to affected areas for 2 weeks then as needed only. (Patient not taking: Reported on 05/17/2023), Disp: 60 g, Rfl: 2   diclofenac sodium (VOLTAREN) 1 % GEL, Apply 1 application  topically 2 (two) times daily. Pain for thumb and knees (Patient not taking: Reported on 05/17/2023), Disp: , Rfl:    estradiol (ESTRACE) 0.1 MG/GM vaginal cream, Place vaginally every Saturday.  (Patient not taking: Reported on 05/17/2023), Disp: , Rfl:    tacrolimus (PROTOPIC) 0.1 % ointment, Apply twice daily to affected areas in groin. (Patient not taking: Reported on 05/17/2023), Disp: 60 g, Rfl: 2  Review of Symptoms: Complete 10-system review is for: Pelvic pain, back pain, headaches, ringing in ears  Physical Exam: BP 126/60 (BP Location: Left Arm, Patient Position: Sitting)   Pulse 87   Temp 98.7 F (37.1 C) (Oral)   Resp 20   Wt 172 lb (78 kg)   SpO2 100%   BMI 32.50 kg/m  General: Alert, oriented, no acute distress. HEENT: Normocephalic, atraumatic. Neck symmetric without masses. Sclera anicteric.  Chest: Normal work of breathing. Clear to auscultation bilaterally.   Cardiovascular: Regular rate and rhythm, no murmurs. Abdomen: Soft, nontender.  Normoactive bowel sounds.  No masses appreciated.   Extremities: Grossly normal range of motion.  Warm, well perfused.  No edema bilaterally. Skin: No rashes or lesions noted. GU: External genitalia with well healing right vulvectomy site with intact sutures. No erythema or induration. Small exudate rinse off. No drainage. Exam chaperoned by Kimberly [place], CMA   Laboratory & Radiologic Studies: Surgical pathology (05/10/23): FINAL MICROSCOPIC  DIAGNOSIS:  A. VULVA, RIGHT, VULVECTOMY:      Extramammary Paget disease.      Surgical margins of resection are negative for Paget disease.      See oncology table.  ONCOLOGY TABLE:  VULVA, CARCINOMA: Resection  Procedure: Vulvectomy Tumor Focality: Unifocal Tumor Site: Vulva, not otherwise specified Tumor Size: Cannot be determined Histologic Type: Extramammary Paget disease Histologic Grade: NA Depth of Invasion:      Specify depth of invasion (mm): NA, no invasion identified Other Tissue/ Organ Involvement: Not applicable Lymphovascular Invasion: Not identified Margins:      Margins Involved by Invasive Carcinoma: All margins negative for Paget disease      Margin Status for HSIL or dVIN: NA Regional Lymph Nodes Not applicable (no lymph nodes submitted or found)]           Lymph Nodes Examined:                                   [0] Sentinel                                   [  0] Non-sentinel                                   [0] Total           Number of Nodes with Metastasis 5 mm or Greater: NA           Number of Nodes with Metastasis Less than 5 mm (excludes isolated tumor cells): NA           Number of Nodes with Isolated Tumor Cells (0.2 mm or less): NA           Additional Lymph Node Findings: NA Distant Metastasis:      Distant Site(s) Involved: Not applicable Pathologic Stage Classification (pTNM, AJCC 8th Edition): pTis, pNx Ancillary Studies: Immunohistochemical stain for CK7 on A1 is negative. Representative Tumor Block: A3, A6 Comment(s): None

## 2023-05-23 NOTE — Telephone Encounter (Signed)
Rx for macrobid sent

## 2023-05-23 NOTE — Patient Instructions (Signed)
It was a pleasure to see you in clinic today. - Healing well overall - You can do sitz baths once daily or every other day - Return visit planned for 1 month  Thank you very much for allowing me to provide care for you today.  I appreciate your confidence in choosing our Gynecologic Oncology team at [facility]  If you have any questions about your visit today please call our office or send [place] a MyChart message and we will get back to you as soon as possible.

## 2023-05-31 ENCOUNTER — Encounter: Payer: Self-pay | Admitting: Psychiatry

## 2023-06-09 ENCOUNTER — Inpatient Hospital Stay: Payer: Medicare Other

## 2023-06-09 ENCOUNTER — Other Ambulatory Visit: Payer: Self-pay | Admitting: *Deleted

## 2023-06-09 ENCOUNTER — Telehealth: Payer: Self-pay | Admitting: *Deleted

## 2023-06-09 DIAGNOSIS — Z9079 Acquired absence of other genital organ(s): Secondary | ICD-10-CM | POA: Diagnosis not present

## 2023-06-09 DIAGNOSIS — R300 Dysuria: Secondary | ICD-10-CM | POA: Diagnosis not present

## 2023-06-09 DIAGNOSIS — C519 Malignant neoplasm of vulva, unspecified: Secondary | ICD-10-CM | POA: Diagnosis not present

## 2023-06-09 DIAGNOSIS — G8918 Other acute postprocedural pain: Secondary | ICD-10-CM | POA: Diagnosis not present

## 2023-06-09 NOTE — Telephone Encounter (Signed)
Spoke with Joan Vargas this morning who called the office wanting to leave a message for Dr. Alvester Morin. Pt reports still complaining of urinary frequency, urgency and sharp pains after making only a small stream of urine. Pt denies fever, chills and blood in urine. Pt also reports lower back pain that started with symptoms. Pt feels like she is not emptying her bladder and completed her macrobid Rx that she started on 12/10 and feels the antibiotics have not worked. Advised patient that her message would be relayed to provider and the office would call back with recommendations.

## 2023-06-09 NOTE — Telephone Encounter (Signed)
Spoke with Ms. Parkey and relayed message from Dr. Alvester Morin. Provider ordered a urine culture today stat and patient was made a lab appointment for 1400. Pt agreed to appt. Time.

## 2023-06-10 ENCOUNTER — Other Ambulatory Visit: Payer: Self-pay | Admitting: Gynecologic Oncology

## 2023-06-10 DIAGNOSIS — N3000 Acute cystitis without hematuria: Secondary | ICD-10-CM

## 2023-06-10 MED ORDER — AMOXICILLIN-POT CLAVULANATE 250-125 MG PO TABS: 1.0000 | ORAL_TABLET | Freq: Three times a day (TID) | ORAL | 0 refills | Status: DC

## 2023-06-10 NOTE — Telephone Encounter (Signed)
Spoke with Joan Vargas and relayed results of urine culture that came back e.coli. Reinforced to patient of proper hygiene after urination. And Warner Mccreedy, NP sent in a Rx for Augmentin to pt's pharmacy. Also advised patient to call the office on Monday to let us know if symptoms have improved. Pt verbalized understanding and thanked the office for calling.

## 2023-06-10 NOTE — Progress Notes (Signed)
See RN note. Patient has taken course was macrobid with no relief in symptoms. Urine culture with >100, 000 colonies E coli. Awaiting susceptibilities, pt is symptomatic. Allergy to sulfa. Augmentin prescribed.

## 2023-06-11 LAB — URINE CULTURE: Culture: >=100000 — AB

## 2023-06-13 NOTE — Telephone Encounter (Signed)
Spoke with Ms. Biehler who states her urinary symptoms are getting a little better. She states she is going to the bathroom less frequently. Pt states someone from the office called her yesterday to check in? She doesn't remember who. Advised patient to continue taking antibiotics are prescribed and reinforced proper hygiene after urination. Also advised patient to call the office if urinary symptoms worsen or with any new symptoms. Pt verbalized understanding and reminded of her appointment with Dr. Alvester Morin on Monday, January 6th at 2 pm.

## 2023-06-14 ENCOUNTER — Other Ambulatory Visit: Payer: Self-pay | Admitting: Geriatric Medicine

## 2023-06-14 DIAGNOSIS — Z1231 Encounter for screening mammogram for malignant neoplasm of breast: Secondary | ICD-10-CM

## 2023-06-20 ENCOUNTER — Inpatient Hospital Stay: Payer: Medicare Other | Attending: Psychiatry | Admitting: Psychiatry

## 2023-06-20 ENCOUNTER — Encounter: Payer: Self-pay | Admitting: Psychiatry

## 2023-06-20 VITALS — BP 110/77 | HR 84 | Temp 97.9°F | Resp 18 | Wt 177.2 lb

## 2023-06-20 DIAGNOSIS — Z9079 Acquired absence of other genital organ(s): Secondary | ICD-10-CM

## 2023-06-20 DIAGNOSIS — C4499 Other specified malignant neoplasm of skin, unspecified: Secondary | ICD-10-CM

## 2023-06-20 DIAGNOSIS — C519 Malignant neoplasm of vulva, unspecified: Secondary | ICD-10-CM

## 2023-06-20 NOTE — Patient Instructions (Signed)
 It was a pleasure to see you in clinic today. - Everything is now well healed. You do not need to continue with the sitz baths - Return visit planned for 6 months  Thank you very much for allowing me to provide care for you today.  I appreciate your confidence in choosing our Gynecologic Oncology team at [facility].  If you have any questions about your visit today please call our office or send us  a MyChart message and we will get back to you as soon as possible.

## 2023-06-20 NOTE — Progress Notes (Signed)
 Gynecologic Oncology Return Clinic Visit  Date of Service: 06/20/2023 Referring Provider: Delon Lenis, MD   Assessment & Plan: Joan Vargas is a 68 y.o. woman with noninvasive extramammary Paget's disease of the vulva who is s/p simple partial vulvectomy on 05/10/23, with negative margins, presents for follow-up.  Postop: - now well healed. Can discontinue sitz baths.   Paget's disease of the vulva: - Pathology results reviewed in detail - No invasion, negative margins. - Recommend observation. - Signs/symptoms of recurrence reviewed. - Surveillance reviewed. Follow-up q6 months x2 years then yearly.   RTC 63mo.  Hoy Masters, MD Gynecologic Oncology   ----------------------- Reason for Visit: Follow-up  Treatment History: Oncology History  Paget's disease of vulva (HCC)  03/28/2023 Initial Biopsy   1. Skin, right labia majora :       EXTRAMAMMARY PAGET'S DISEASE, SEE DESCRIPTION  MICROSCOPIC DESCRIPTION 1. Sections show a punch biopsy of skin within the epidermis there is a proliferation of large epithelioid cells with abundant basophilic cytoplasm. The nuclei is large with an open chromatin.  There is extension of these cells to the follicular epithelium.  The dermis shows a perivascular predominately lymphocytic inflammatory infiltrate and fibroplasia.  Immunohistochemistry stains for CK7 highlight these cells.  The CK20, S100 and Melan-A stains are negative.  Multiple sections have been examined.   (MM:gt, 03/30/23)    03/28/2023 Initial Diagnosis   Paget's disease of vulva (HCC)   05/10/2023 Surgery   Procedures: Simple partial vulvectomy  Findings: External genitalia with area of whitening and thickening of the majority of the right labia majora, extending towards the introitus.  Prior biopsy sites visible and healing.  No additional findings.    05/10/2023 Pathology Results   A. VULVA, RIGHT, VULVECTOMY:      Extramammary Paget disease.      Surgical  margins of resection are negative for Paget disease.      See oncology table.  ONCOLOGY TABLE:  VULVA, CARCINOMA: Resection  Procedure: Vulvectomy Tumor Focality: Unifocal Tumor Site: Vulva, not otherwise specified Tumor Size: Cannot be determined Histologic Type: Extramammary Paget disease Histologic Grade: NA Depth of Invasion:      Specify depth of invasion (mm): NA, no invasion identified Other Tissue/ Organ Involvement: Not applicable Lymphovascular Invasion: Not identified Margins:      Margins Involved by Invasive Carcinoma: All margins negative for Paget disease      Margin Status for HSIL or dVIN: NA Regional Lymph Nodes Not applicable (no lymph nodes submitted or found)]           Lymph Nodes Examined:                                   [0] Sentinel                                   [0] Non-sentinel                                   [0] Total           Number of Nodes with Metastasis 5 mm or Greater: NA           Number of Nodes with Metastasis Less than 5 mm (excludes isolated tumor cells): NA  Number of Nodes with Isolated Tumor Cells (0.2 mm or less): NA           Additional Lymph Node Findings: NA Distant Metastasis:      Distant Site(s) Involved: Not applicable Pathologic Stage Classification (pTNM, AJCC 8th Edition): pTis, pNx Ancillary Studies: Immunohistochemical stain for CK7 on A1 is negative. Representative Tumor Block: A3, A6 Comment(s): None      Interval History: Patient feels that she has improved healing for her vulva.  She has been continue with sitz bath's in the meantime.  Her pain has improved.  She does occasionally report pressure in the area with a shooting pain but this is not all the time.  No more spotting.  Reports that she was up frequently for the bathroom last night but did have improvement in her dysuria and urinary frequency after treatment with antibiotics for UTI.   Past Medical/Surgical History: Past Medical History:   Diagnosis Date   Allergy    Anemia    Anxiety    Arthritis    knees and thumbs    Depression    Diabetes mellitus    diet controlled not on meds    Fibromyalgia    GERD (gastroesophageal reflux disease)    Headache(784.0)    Hyperlipidemia    Hypertension    PONV (postoperative nausea and vomiting)    Restless leg    Thyroid  disease     Past Surgical History:  Procedure Laterality Date   ABDOMINAL HYSTERECTOMY  06/14/1984   APPENDECTOMY  06/14/1954   BREAST BIOPSY Left 06/23/2004   CESAREAN SECTION  1984, 1986   KNEE ARTHROSCOPY  06/14/1998   right   OOPHORECTOMY Right    possible cyst on ovary   THYROIDECTOMY  11/04/2011   Procedure: THYROIDECTOMY;  Surgeon: Krystal CHRISTELLA Spinner, MD;  Location: [facility];  Service: General;  Laterality: N/A;  Total Thyroidectomy   TOTAL KNEE ARTHROPLASTY Right 11/09/2013   Procedure: RIGHT TOTAL KNEE ARTHROPLASTY;  Surgeon: Elspeth JONELLE Her, MD;  Location: [facility] OR;  Service: Orthopedics;  Laterality: Right;   VULVECTOMY N/A 05/10/2023   Procedure: WIDE EXCISION VULVECTOMY;  Surgeon: Eldonna Mays, MD;  Location: [facility];  Service: Gynecology;  Laterality: N/A;    Family History  Problem Relation Age of Onset   Diabetes Mother    Colon cancer Neg Hx    Stomach cancer Neg Hx    Esophageal cancer Neg Hx    Rectal cancer Neg Hx    Colon polyps Neg Hx    Breast cancer Neg Hx    Ovarian cancer Neg Hx    Endometrial cancer Neg Hx     Social History   Socioeconomic History   Marital status: Married    Spouse name: Not on file   Number of children: Not on file   Years of education: Not on file   Highest education level: Not on file  Occupational History   Not on file  Tobacco Use   Smoking status: Former    Current packs/day: 0.00    Average packs/day: 0.5 packs/day for 5.0 years (2.5 ttl pk-yrs)    Types: Cigarettes    Start date: 06/14/1976    Quit date: 06/14/1981    Years since quitting: 42.0   Smokeless tobacco:  Never  Vaping Use   Vaping status: Never Used  Substance and Sexual Activity   Alcohol  use: No    Comment: occasional   Drug use: No   Sexual activity: Not Currently  Other Topics Concern   Not on file  Social History Narrative   Not on file   Social Drivers of Health   Financial Resource Strain: Not on file  Food Insecurity: No Food Insecurity (04/21/2023)   Hunger Vital Sign    Worried About Running Out of Food in the Last Year: Never true    Ran Out of Food in the Last Year: Never true  Transportation Needs: No Transportation Needs (04/21/2023)   PRAPARE - Administrator, Civil Service (Medical): No    Lack of Transportation (Non-Medical): No  Physical Activity: Not on file  Stress: Not on file  Social Connections: Not on file    Current Medications:  Current Outpatient Medications:    aspirin  81 MG tablet, Take 81 mg by mouth at bedtime. , Disp: , Rfl:    Continuous Glucose Sensor (DEXCOM G7 SENSOR) MISC, CHANGE SENSOR EVERY 10 DAYS, Disp: , Rfl:    D2000 ULTRA STRENGTH 50 MCG (2000 UT) CAPS, Take 1 capsule by mouth at bedtime., Disp: , Rfl:    esomeprazole  (NEXIUM ) 20 MG capsule, Take 40 mg by mouth daily., Disp: , Rfl:    estradiol  (ESTRACE ) 0.1 MG/GM vaginal cream, Place vaginally every Saturday., Disp: , Rfl:    fluticasone  (FLONASE ) 50 MCG/ACT nasal spray, Place 1 spray into both nostrils at bedtime. , Disp: , Rfl:    gabapentin  (NEURONTIN ) 300 MG capsule, Take 300 mg by mouth at bedtime., Disp: , Rfl:    Glucosamine-Chondroitin (MOVE FREE PO), Take 1 capsule by mouth every morning., Disp: , Rfl:    levothyroxine  (SYNTHROID ) 125 MCG tablet, Take 125 mcg by mouth every morning., Disp: , Rfl:    metFORMIN  (GLUCOPHAGE ) 1000 MG tablet, Take 1,000 mg by mouth 2 (two) times daily with a meal., Disp: , Rfl:    metoprolol  succinate (TOPROL -XL) 50 MG 24 hr tablet, Take 50 mg by mouth every morning. Take with or immediately following a meal., Disp: , Rfl:     montelukast  (SINGULAIR ) 10 MG tablet, Take 10 mg by mouth at bedtime., Disp: , Rfl:    Multiple Vitamin (MULTIVITAMIN) tablet, Take 1 tablet by mouth every morning. , Disp: , Rfl:    olmesartan -hydrochlorothiazide  (BENICAR  HCT) 40-12.5 MG tablet, Take 1 tablet by mouth daily., Disp: , Rfl:    OVER THE COUNTER MEDICATION, [employer] Allergy 25 mg. One tablet at night., Disp: , Rfl:    OVER THE COUNTER MEDICATION, Tumeric two tablets one time daily., Disp: , Rfl:    OVER THE COUNTER MEDICATION, Biotin 10, 000 mcg one tablet daily., Disp: , Rfl:    OZEMPIC, 0.25 OR 0.5 MG/DOSE, 2 MG/3ML SOPN, SMARTSIG:0.45 Milligram(s) SUB-Q Once a Week, Disp: , Rfl:    pramipexole  (MIRAPEX ) 0.25 MG tablet, Take 0.25 mg by mouth at bedtime., Disp: , Rfl:    Probiotic Product (PROBIOTIC DAILY PO), Take by mouth daily., Disp: , Rfl:    rosuvastatin  (CRESTOR ) 5 MG tablet, Take 5 mg by mouth every other day., Disp: , Rfl:    tiZANidine (ZANAFLEX) 4 MG tablet, Take 4 mg by mouth every 6 (six) hours as needed for muscle spasms., Disp: , Rfl:   Review of Symptoms: Complete 10-system review is for: none  Physical Exam: BP 110/77 (BP Location: Right Arm, Patient Position: Sitting)   Pulse 84   Temp 97.9 F (36.6 C) (Tympanic)   Resp 18   Wt 177 lb 3.2 oz (80.4 kg)   SpO2 100%   BMI 33.48 kg/m  General: Alert, oriented, no acute distress. HEENT: Normocephalic, atraumatic. Neck symmetric without masses. Sclera anicteric.  Chest: Normal work of breathing.  Extremities: Grossly normal range of motion.  Warm, well perfused.  No edema bilaterally. Skin: No rashes or lesions noted. GU: External genitalia with complete healing of vulva from vulvectomy.  Exam chaperoned by Rosaline Gunner, NT    Laboratory & Radiologic Studies: None

## 2023-06-21 DIAGNOSIS — R45851 Suicidal ideations: Secondary | ICD-10-CM | POA: Diagnosis not present

## 2023-06-21 DIAGNOSIS — E039 Hypothyroidism, unspecified: Secondary | ICD-10-CM | POA: Diagnosis not present

## 2023-06-21 DIAGNOSIS — G2581 Restless legs syndrome: Secondary | ICD-10-CM | POA: Diagnosis not present

## 2023-06-21 DIAGNOSIS — E1140 Type 2 diabetes mellitus with diabetic neuropathy, unspecified: Secondary | ICD-10-CM | POA: Diagnosis not present

## 2023-06-21 DIAGNOSIS — I700 Atherosclerosis of aorta: Secondary | ICD-10-CM | POA: Diagnosis not present

## 2023-06-21 DIAGNOSIS — Z7984 Long term (current) use of oral hypoglycemic drugs: Secondary | ICD-10-CM | POA: Diagnosis not present

## 2023-06-21 DIAGNOSIS — E559 Vitamin D deficiency, unspecified: Secondary | ICD-10-CM | POA: Diagnosis not present

## 2023-06-21 DIAGNOSIS — Z8544 Personal history of malignant neoplasm of other female genital organs: Secondary | ICD-10-CM | POA: Diagnosis not present

## 2023-06-21 DIAGNOSIS — E11649 Type 2 diabetes mellitus with hypoglycemia without coma: Secondary | ICD-10-CM | POA: Diagnosis not present

## 2023-06-21 DIAGNOSIS — E1169 Type 2 diabetes mellitus with other specified complication: Secondary | ICD-10-CM | POA: Diagnosis not present

## 2023-06-29 ENCOUNTER — Ambulatory Visit: Payer: Medicare Other | Admitting: Dermatology

## 2023-07-15 DIAGNOSIS — Z9079 Acquired absence of other genital organ(s): Secondary | ICD-10-CM | POA: Diagnosis not present

## 2023-07-15 DIAGNOSIS — N390 Urinary tract infection, site not specified: Secondary | ICD-10-CM | POA: Diagnosis not present

## 2023-07-15 DIAGNOSIS — Z8544 Personal history of malignant neoplasm of other female genital organs: Secondary | ICD-10-CM | POA: Diagnosis not present

## 2023-08-01 ENCOUNTER — Ambulatory Visit
Admission: RE | Admit: 2023-08-01 | Discharge: 2023-08-01 | Disposition: A | Discharge status: Home / Self Care | Payer: Medicare Other | Source: Ambulatory Visit | Attending: Geriatric Medicine | Admitting: Geriatric Medicine

## 2023-08-01 DIAGNOSIS — Z1231 Encounter for screening mammogram for malignant neoplasm of breast: Secondary | ICD-10-CM

## 2023-08-12 DIAGNOSIS — Z8544 Personal history of malignant neoplasm of other female genital organs: Secondary | ICD-10-CM | POA: Diagnosis not present

## 2023-08-12 DIAGNOSIS — J069 Acute upper respiratory infection, unspecified: Secondary | ICD-10-CM | POA: Diagnosis not present

## 2023-08-12 DIAGNOSIS — L900 Lichen sclerosus et atrophicus: Secondary | ICD-10-CM | POA: Diagnosis not present

## 2023-08-12 DIAGNOSIS — E1169 Type 2 diabetes mellitus with other specified complication: Secondary | ICD-10-CM | POA: Diagnosis not present

## 2023-08-12 DIAGNOSIS — N761 Subacute and chronic vaginitis: Secondary | ICD-10-CM | POA: Diagnosis not present

## 2023-08-12 DIAGNOSIS — N771 Vaginitis, vulvitis and vulvovaginitis in diseases classified elsewhere: Secondary | ICD-10-CM | POA: Diagnosis not present

## 2023-08-12 DIAGNOSIS — N763 Subacute and chronic vulvitis: Secondary | ICD-10-CM | POA: Diagnosis not present

## 2023-09-08 ENCOUNTER — Encounter: Payer: Self-pay | Admitting: Dermatology

## 2023-09-08 ENCOUNTER — Ambulatory Visit: Payer: Medicare Other | Admitting: Dermatology

## 2023-09-08 VITALS — BP 115/76

## 2023-09-08 DIAGNOSIS — C4499 Other specified malignant neoplasm of skin, unspecified: Secondary | ICD-10-CM

## 2023-09-08 DIAGNOSIS — L905 Scar conditions and fibrosis of skin: Secondary | ICD-10-CM

## 2023-09-08 DIAGNOSIS — M8888 Osteitis deformans of other bones: Secondary | ICD-10-CM

## 2023-09-08 NOTE — Progress Notes (Signed)
   Follow-Up Visit   Subjective  Joan Vargas is a 68 y.o. female who presents for the following: Paget's disease of vulva   Patient present today for follow up visit. Patient was last evaluated on 04/13/23 when b The patient is status post vulvectomy for Paget's disease of the vulva. She reports ongoing discomfort during urination, describing a sensation of pressure and having to consciously initiate voiding. The patient also experiences difficulty with personal hygiene, noting that the skin feels overworked when wiping. She describes the vulvar area as feeling dry at times. The patient admits to discontinuing her prescribed estradiol cream post-surgery due to forgetfulness.  Additionally, the patient has a history of cyst excision on the left posterior shoulder, performed by Dr. Pasi.   The following portions of the chart were reviewed this encounter and updated as appropriate: medications, allergies, medical history  Review of Systems:  No other skin or systemic complaints except as noted in HPI or Assessment and Plan.  Objective  Well appearing patient in no apparent distress; mood and affect are within normal limits.   A focused examination was performed of the following areas: genital area   Relevant exam findings are noted in the Assessment and Plan.    Assessment & Plan    1. Vulvar Paget's Disease, status post vulvectomy - Assessment: Patient underwent vulvectomy for biopsy-confirmed vulvar Paget's disease. Surgical margins were clear. Right labia removed, left side intact. Surgical site has healed well with some scar tissue present. Patient reports dryness, discomfort during urination, and difficulty with wiping. Symptoms may be related to post-surgical changes and/or estrogen deficiency due to menopause. - Plan:    Resume estradiol cream application     Gave sample of Strata MGT cream for dryness, to be applied daily    If Strata MGT is ineffective, switch to tacrolimus  cream once daily    Recommend blotting instead of aggressive wiping after urination    Suggest use of water wipes (saline wipes without perfume or preservatives) for cleaning    Follow up with surgeon every 6 months for 2 years as per their recommendation     Follow up with dermatologist in 8 months    Advise patient to report urinary symptoms to surgeon for further evaluation  2. Left posterior shoulder cyst excision - Assessment: Well-healed scar from a cyst excision on the left posterior shoulder performed by Dr. Pasi. - Plan: No specific interventions required at this time  Follow-up with dermatologist in 8 months for reassessment of vulvar Paget's disease and overall skin health.   No follow-ups on file.    Documentation: I have reviewed the above documentation for accuracy and completeness, and I agree with the above.  I, Shirron Marcha Solders, CMA, am acting as scribe for [employer], DO.   Langston Reusing, DO

## 2023-09-08 NOTE — Patient Instructions (Addendum)
 Dear Joan Vargas,  Thank you for visiting today. Here is a summary of the key instructions:  - Medications:   - Resume using estradiol cream as prescribed   - Use Strata MGT cream once daily for dryness   - If Strata MGT is not preferred, use tacrolimus cream once daily instead  - Hygiene Instructions:   - When using the bathroom, blot instead of wiping aggressively   - Use water wipes (saline wipes with no perfume or preservatives) instead of Vagisil or Cottonelle wipes   - Find water wipes in the baby aisle at [facility]  - Follow-up Care:   - See the surgeon every six months for two years   - Next appointment with surgeon on July 7th   - Follow-up appointment with Dr. Onalee Hua in 8 months (in the fall)  - Additional Instructions:   - Inform the surgeon about any urination issues at your next appointment  If you have any questions or concerns before your next visit, please do not hesitate to contact our office.  Warm regards,  Dr. Langston Reusing [facility]         Important Information  Due to recent changes in healthcare laws, you may see results of your pathology and/or laboratory studies on MyChart before the doctors have had a chance to review them. We understand that in some cases there may be results that are confusing or concerning to you. Please understand that not all results are received at the same time and often the doctors may need to interpret multiple results in order to provide you with the best plan of care or course of treatment. Therefore, we ask that you please give [place] 2 business days to thoroughly review all your results before contacting the office for clarification. Should we see a critical lab result, you will be contacted sooner.   If You Need Anything After Your Visit  If you have any questions or concerns for your doctor, please call our main line at [phone] If no one answers, please leave a voicemail as directed and we will return your call as soon as  possible. Messages left after 4 pm will be answered the following business day.   You may also send [place] a message via MyChart. We typically respond to MyChart messages within 1-2 business days.  For prescription refills, please ask your pharmacy to contact our office. Our fax number is [phone].  If you have an urgent issue when the clinic is closed that cannot wait until the next business day, you can page your doctor at the number below.    Please note that while we do our best to be available for urgent issues outside of office hours, we are not available 24/7.   If you have an urgent issue and are unable to reach [place], you may choose to seek medical care at your doctor's office, retail clinic, urgent care center, or emergency room.  If you have a medical emergency, please immediately call 911 or go to the emergency department. In the event of inclement weather, please call our main line at [phone] for an update on the status of any delays or closures.  Dermatology Medication Tips: Please keep the boxes that topical medications come in in order to help keep track of the instructions about where and how to use these. Pharmacies typically print the medication instructions only on the boxes and not directly on the medication tubes.   If your medication is too expensive, please contact our  office at [phone] or send [place] a message through MyChart.   We are unable to tell what your co-pay for medications will be in advance as this is different depending on your insurance coverage. However, we may be able to find a substitute medication at lower cost or fill out paperwork to get insurance to cover a needed medication.   If a prior authorization is required to get your medication covered by your insurance company, please allow [place] 1-2 business days to complete this process.  Drug prices often vary depending on where the prescription is filled and some pharmacies may offer cheaper  prices.  The website www.goodrx.com contains coupons for medications through different pharmacies. The prices here do not account for what the cost may be with help from insurance (it may be cheaper with your insurance), but the website can give you the price if you did not use any insurance.  - You can print the associated coupon and take it with your prescription to the pharmacy.  - You may also stop by our office during regular business hours and pick up a GoodRx coupon card.  - If you need your prescription sent electronically to a different pharmacy, notify our office through [facility] or by phone at [phone]

## 2023-09-15 DIAGNOSIS — N3020 Other chronic cystitis without hematuria: Secondary | ICD-10-CM | POA: Diagnosis not present

## 2023-09-15 DIAGNOSIS — R102 Pelvic and perineal pain: Secondary | ICD-10-CM | POA: Diagnosis not present

## 2023-10-18 DIAGNOSIS — E1169 Type 2 diabetes mellitus with other specified complication: Secondary | ICD-10-CM | POA: Diagnosis not present

## 2023-10-18 DIAGNOSIS — E039 Hypothyroidism, unspecified: Secondary | ICD-10-CM | POA: Diagnosis not present

## 2023-10-25 DIAGNOSIS — M1990 Unspecified osteoarthritis, unspecified site: Secondary | ICD-10-CM | POA: Diagnosis not present

## 2023-10-25 DIAGNOSIS — G43909 Migraine, unspecified, not intractable, without status migrainosus: Secondary | ICD-10-CM | POA: Diagnosis not present

## 2023-11-15 DIAGNOSIS — R350 Frequency of micturition: Secondary | ICD-10-CM | POA: Diagnosis not present

## 2023-11-15 DIAGNOSIS — N3020 Other chronic cystitis without hematuria: Secondary | ICD-10-CM | POA: Diagnosis not present

## 2023-11-15 DIAGNOSIS — N3941 Urge incontinence: Secondary | ICD-10-CM | POA: Diagnosis not present

## 2023-11-25 DIAGNOSIS — R3121 Asymptomatic microscopic hematuria: Secondary | ICD-10-CM | POA: Diagnosis not present

## 2023-11-29 DIAGNOSIS — R102 Pelvic and perineal pain: Secondary | ICD-10-CM | POA: Diagnosis not present

## 2023-11-29 DIAGNOSIS — K4020 Bilateral inguinal hernia, without obstruction or gangrene, not specified as recurrent: Secondary | ICD-10-CM | POA: Diagnosis not present

## 2023-11-29 DIAGNOSIS — N281 Cyst of kidney, acquired: Secondary | ICD-10-CM | POA: Diagnosis not present

## 2023-11-29 DIAGNOSIS — R160 Hepatomegaly, not elsewhere classified: Secondary | ICD-10-CM | POA: Diagnosis not present

## 2023-11-29 DIAGNOSIS — R3121 Asymptomatic microscopic hematuria: Secondary | ICD-10-CM | POA: Diagnosis not present

## 2023-12-12 DIAGNOSIS — N3020 Other chronic cystitis without hematuria: Secondary | ICD-10-CM | POA: Diagnosis not present

## 2023-12-12 DIAGNOSIS — R102 Pelvic and perineal pain: Secondary | ICD-10-CM | POA: Diagnosis not present

## 2023-12-19 ENCOUNTER — Encounter: Payer: Self-pay | Admitting: Psychiatry

## 2023-12-19 ENCOUNTER — Inpatient Hospital Stay: Payer: Medicare Other | Attending: Psychiatry | Admitting: Psychiatry

## 2023-12-19 VITALS — BP 108/63 | HR 83 | Temp 97.7°F | Resp 20 | Wt 171.6 lb

## 2023-12-19 DIAGNOSIS — Z8544 Personal history of malignant neoplasm of other female genital organs: Secondary | ICD-10-CM | POA: Insufficient documentation

## 2023-12-19 DIAGNOSIS — Z9079 Acquired absence of other genital organ(s): Secondary | ICD-10-CM | POA: Insufficient documentation

## 2023-12-19 DIAGNOSIS — C4499 Other specified malignant neoplasm of skin, unspecified: Secondary | ICD-10-CM

## 2023-12-19 NOTE — Patient Instructions (Signed)
 It was a pleasure to see you in clinic today. - Normal exam today - Return visit planned for 6 months - Let us  know what creams your dermatologist prescribed.  Thank you very much for allowing me to provide care for you today.  I appreciate your confidence in choosing our Gynecologic Oncology team at [facility].  If you have any questions about your visit today please call our office or send us  a MyChart message and we will get back to you as soon as possible.

## 2023-12-19 NOTE — Progress Notes (Signed)
 Gynecologic Oncology Return Clinic Visit  Date of Service: 12/19/2023 Referring Provider: Delon Lenis, DO   Assessment & Plan: Joan Vargas is a 68 y.o. woman with noninvasive extramammary Paget's disease of the vulva who is s/p simple partial vulvectomy on 05/10/23, with negative margins, presents for follow-up.  Paget's disease of the vulva: - No invasion, negative margins. - Recommend observation. - NED on exam today. - Signs/symptoms of recurrence reviewed. - Surveillance reviewed. Follow-up q6 months x2 years then yearly.   RTC 28mo.  Hoy Masters, MD Gynecologic Oncology   ----------------------- Reason for Visit: Follow-up  Treatment History: Oncology History  Paget's disease of vulva (HCC)  03/28/2023 Initial Biopsy   1. Skin, right labia majora :       EXTRAMAMMARY PAGET'S DISEASE, SEE DESCRIPTION  MICROSCOPIC DESCRIPTION 1. Sections show a punch biopsy of skin within the epidermis there is a proliferation of large epithelioid cells with abundant basophilic cytoplasm. The nuclei is large with an open chromatin.  There is extension of these cells to the follicular epithelium.  The dermis shows a perivascular predominately lymphocytic inflammatory infiltrate and fibroplasia.  Immunohistochemistry stains for CK7 highlight these cells.  The CK20, S100 and Melan-A stains are negative.  Multiple sections have been examined.   (MM:gt, 03/30/23)    03/28/2023 Initial Diagnosis   Paget's disease of vulva (HCC)   05/10/2023 Surgery   Procedures: Simple partial vulvectomy  Findings: External genitalia with area of whitening and thickening of the majority of the right labia majora, extending towards the introitus.  Prior biopsy sites visible and healing.  No additional findings.    05/10/2023 Pathology Results   A. VULVA, RIGHT, VULVECTOMY:      Extramammary Paget disease.      Surgical margins of resection are negative for Paget disease.      See oncology  table.  ONCOLOGY TABLE:  VULVA, CARCINOMA: Resection  Procedure: Vulvectomy Tumor Focality: Unifocal Tumor Site: Vulva, not otherwise specified Tumor Size: Cannot be determined Histologic Type: Extramammary Paget disease Histologic Grade: NA Depth of Invasion:      Specify depth of invasion (mm): NA, no invasion identified Other Tissue/ Organ Involvement: Not applicable Lymphovascular Invasion: Not identified Margins:      Margins Involved by Invasive Carcinoma: All margins negative for Paget disease      Margin Status for HSIL or dVIN: NA Regional Lymph Nodes Not applicable (no lymph nodes submitted or found)]           Lymph Nodes Examined:                                   [0] Sentinel                                   [0] Non-sentinel                                   [0] Total           Number of Nodes with Metastasis 5 mm or Greater: NA           Number of Nodes with Metastasis Less than 5 mm (excludes isolated tumor cells): NA           Number of Nodes with Isolated Tumor Cells (0.2  mm or less): NA           Additional Lymph Node Findings: NA Distant Metastasis:      Distant Site(s) Involved: Not applicable Pathologic Stage Classification (pTNM, AJCC 8th Edition): pTis, pNx Ancillary Studies: Immunohistochemical stain for CK7 on A1 is negative. Representative Tumor Block: A3, A6 Comment(s): None      Interval History: Following with Dr. Marykay for pain with urination. Reports they put her on trimethoprim and azo to help.  Denies vulvar irritation, redness, lesions or itching. Does note occasional pain when she sits down. Reports that her dermatologist gave her a cream to try.  She isn't sure the name.    Past Medical/Surgical History: Past Medical History:  Diagnosis Date   Allergy    Anemia    Anxiety    Arthritis    knees and thumbs    Depression    Diabetes mellitus    diet controlled not on meds    Fibromyalgia    GERD (gastroesophageal reflux  disease)    Headache(784.0)    Hyperlipidemia    Hypertension    PONV (postoperative nausea and vomiting)    Restless leg    Thyroid  disease     Past Surgical History:  Procedure Laterality Date   ABDOMINAL HYSTERECTOMY  06/14/1984   APPENDECTOMY  06/14/1954   BREAST BIOPSY Left 06/23/2004   CESAREAN SECTION  1984, 1986   KNEE ARTHROSCOPY  06/14/1998   right   OOPHORECTOMY Right    possible cyst on ovary   THYROIDECTOMY  11/04/2011   Procedure: THYROIDECTOMY;  Surgeon: Krystal CHRISTELLA Spinner, MD;  Location: [facility];  Service: General;  Laterality: N/A;  Total Thyroidectomy   TOTAL KNEE ARTHROPLASTY Right 11/09/2013   Procedure: RIGHT TOTAL KNEE ARTHROPLASTY;  Surgeon: Elspeth JONELLE Her, MD;  Location: [facility];  Service: Orthopedics;  Laterality: Right;   VULVECTOMY N/A 05/10/2023   Procedure: WIDE EXCISION VULVECTOMY;  Surgeon: Eldonna Mays, MD;  Location: [facility];  Service: Gynecology;  Laterality: N/A;    Family History  Problem Relation Age of Onset   Diabetes Mother    Colon cancer Neg Hx    Stomach cancer Neg Hx    Esophageal cancer Neg Hx    Rectal cancer Neg Hx    Colon polyps Neg Hx    Breast cancer Neg Hx    Ovarian cancer Neg Hx    Endometrial cancer Neg Hx     Social History   Socioeconomic History   Marital status: Married    Spouse name: Not on file   Number of children: Not on file   Years of education: Not on file   Highest education level: Not on file  Occupational History   Not on file  Tobacco Use   Smoking status: Former    Current packs/day: 0.00    Average packs/day: 0.5 packs/day for 5.0 years (2.5 ttl pk-yrs)    Types: Cigarettes    Start date: 06/14/1976    Quit date: 06/14/1981    Years since quitting: 42.5   Smokeless tobacco: Never  Vaping Use   Vaping status: Never Used  Substance and Sexual Activity   Alcohol  use: No    Comment: occasional   Drug use: No   Sexual activity: Not Currently  Other Topics Concern   Not on  file  Social History Narrative   Not on file   Social Drivers of Health   Financial Resource Strain: Not on file  Food Insecurity:  No Food Insecurity (04/21/2023)   Hunger Vital Sign    Worried About Running Out of Food in the Last Year: Never true    Ran Out of Food in the Last Year: Never true  Transportation Needs: No Transportation Needs (04/21/2023)   PRAPARE - Administrator, Civil Service (Medical): No    Lack of Transportation (Non-Medical): No  Physical Activity: Not on file  Stress: Not on file  Social Connections: Not on file    Current Medications:  Current Outpatient Medications:    aspirin  81 MG tablet, Take 81 mg by mouth at bedtime. , Disp: , Rfl:    Continuous Glucose Sensor (DEXCOM G7 SENSOR) MISC, CHANGE SENSOR EVERY 10 DAYS, Disp: , Rfl:    D2000 ULTRA STRENGTH 50 MCG (2000 UT) CAPS, Take 1 capsule by mouth at bedtime., Disp: , Rfl:    esomeprazole (NEXIUM) 20 MG capsule, Take 40 mg by mouth daily., Disp: , Rfl:    estradiol  (ESTRACE ) 0.1 MG/GM vaginal cream, Place vaginally every Saturday., Disp: , Rfl:    fluticasone  (FLONASE ) 50 MCG/ACT nasal spray, Place 1 spray into both nostrils at bedtime. , Disp: , Rfl:    gabapentin (NEURONTIN) 300 MG capsule, Take 300 mg by mouth at bedtime., Disp: , Rfl:    Glucosamine-Chondroitin (MOVE FREE PO), Take 1 capsule by mouth every morning., Disp: , Rfl:    levothyroxine  (SYNTHROID ) 125 MCG tablet, Take 125 mcg by mouth every morning., Disp: , Rfl:    metFORMIN  (GLUCOPHAGE ) 1000 MG tablet, Take 1,000 mg by mouth 2 (two) times daily with a meal., Disp: , Rfl:    metoprolol  succinate (TOPROL -XL) 50 MG 24 hr tablet, Take 50 mg by mouth every morning. Take with or immediately following a meal., Disp: , Rfl:    montelukast  (SINGULAIR ) 10 MG tablet, Take 10 mg by mouth at bedtime., Disp: , Rfl:    Multiple Vitamin (MULTIVITAMIN) tablet, Take 1 tablet by mouth every morning. , Disp: , Rfl:     olmesartan-hydrochlorothiazide  (BENICAR HCT) 40-12.5 MG tablet, Take 1 tablet by mouth daily., Disp: , Rfl:    OVER THE COUNTER MEDICATION, Wal[employer]ryl Allergy 25 mg. One tablet at night., Disp: , Rfl:    OVER THE COUNTER MEDICATION, Tumeric two tablets one time daily., Disp: , Rfl:    OVER THE COUNTER MEDICATION, Biotin 10, 000 mcg one tablet daily., Disp: , Rfl:    OZEMPIC, 0.25 OR 0.5 MG/DOSE, 2 MG/3ML SOPN, SMARTSIG:0.45 Milligram(s) SUB-Q Once a Week, Disp: , Rfl:    phenazopyridine (PYRIDIUM) 97 MG tablet, Take 97 mg by mouth 3 (three) times daily as needed for pain., Disp: , Rfl:    pramipexole  (MIRAPEX ) 0.25 MG tablet, Take 0.25 mg by mouth at bedtime., Disp: , Rfl:    Probiotic Product (PROBIOTIC DAILY PO), Take by mouth daily., Disp: , Rfl:    Pumpkin Seed-Soy Germ (AZO BLADDER CONTROL/GO-LESS) CAPS, Take by mouth., Disp: , Rfl:    rosuvastatin (CRESTOR) 5 MG tablet, Take 5 mg by mouth every other day., Disp: , Rfl:    tiZANidine (ZANAFLEX) 4 MG tablet, Take 4 mg by mouth every 6 (six) hours as needed for muscle spasms., Disp: , Rfl:    trimethoprim (TRIMPEX) 100 MG tablet, trimethoprim 100 mg tabs, Disp: , Rfl:   Review of Symptoms: Complete 10-system review is for: urinary problems  Physical Exam: BP 108/63 (BP Location: Left Arm, Patient Position: Sitting)   Pulse 83   Temp 97.7 F (36.5 C) (Oral)  Resp 20   Wt 171 lb 9.6 oz (77.8 kg)   SpO2 100%   BMI 32.42 kg/m  General: Alert, oriented, no acute distress. HEENT: Normocephalic, atraumatic. Neck symmetric without masses. Sclera anicteric.  Chest: Normal work of breathing. Clear to auscultation bilaterally.   Cardiovascular: Regular rate and rhythm, no murmurs. Abdomen: Soft, nontender.  Normoactive bowel sounds.  No masses appreciated.  Extremities: Grossly normal range of motion.  Warm, well perfused.  No edema bilaterally. Skin: No rashes or lesions noted. Lymphatics: No cervical, supraclavicular, or inguinal  adenopathy. GU: Normal appearing external genitalia without erythema, excoriation, or lesions. Well healed right vulva with evidence of prior right vulvectomy. Speculum exam reveals atrophic vaginal mucosa without lesion.  Bimanual exam reveals smooth vaginal cuff.  Exam chaperoned by Kimberly Swaziland, CMA    Laboratory & Radiologic Studies: None

## 2023-12-21 ENCOUNTER — Encounter: Payer: Self-pay | Admitting: Family Medicine

## 2023-12-21 ENCOUNTER — Ambulatory Visit: Admitting: Family Medicine

## 2023-12-21 VITALS — BP 116/74 | HR 76 | Temp 98.1°F | Ht 61.0 in | Wt 170.0 lb

## 2023-12-21 DIAGNOSIS — K219 Gastro-esophageal reflux disease without esophagitis: Secondary | ICD-10-CM | POA: Diagnosis not present

## 2023-12-21 DIAGNOSIS — M797 Fibromyalgia: Secondary | ICD-10-CM

## 2023-12-21 DIAGNOSIS — E119 Type 2 diabetes mellitus without complications: Secondary | ICD-10-CM | POA: Diagnosis not present

## 2023-12-21 DIAGNOSIS — I10 Essential (primary) hypertension: Secondary | ICD-10-CM | POA: Diagnosis not present

## 2023-12-21 DIAGNOSIS — E785 Hyperlipidemia, unspecified: Secondary | ICD-10-CM | POA: Diagnosis not present

## 2023-12-21 DIAGNOSIS — J309 Allergic rhinitis, unspecified: Secondary | ICD-10-CM | POA: Diagnosis not present

## 2023-12-21 DIAGNOSIS — G2581 Restless legs syndrome: Secondary | ICD-10-CM

## 2023-12-21 DIAGNOSIS — E890 Postprocedural hypothyroidism: Secondary | ICD-10-CM

## 2023-12-21 DIAGNOSIS — Z7689 Persons encountering health services in other specified circumstances: Secondary | ICD-10-CM

## 2023-12-21 DIAGNOSIS — M545 Low back pain, unspecified: Secondary | ICD-10-CM

## 2023-12-21 LAB — COMPREHENSIVE METABOLIC PANEL WITH GFR
ALT: 24 U/L (ref 0–35)
AST: 19 U/L (ref 0–37)
Albumin: 4.9 g/dL (ref 3.5–5.2)
Alkaline Phosphatase: 45 U/L (ref 39–117)
BUN: 24 mg/dL — ABNORMAL HIGH (ref 6–23)
CO2: 28 meq/L (ref 19–32)
Calcium: 10.1 mg/dL (ref 8.4–10.5)
Chloride: 100 meq/L (ref 96–112)
Creatinine, Ser: 1.3 mg/dL — ABNORMAL HIGH (ref 0.40–1.20)
GFR: 41.68 mL/min — ABNORMAL LOW (ref >60.00)
Glucose, Bld: 124 mg/dL — ABNORMAL HIGH (ref 70–99)
Potassium: 4.5 meq/L (ref 3.5–5.1)
Sodium: 135 meq/L (ref 135–145)
Total Bilirubin: 0.5 mg/dL (ref 0.2–1.2)
Total Protein: 7.6 g/dL (ref 6.0–8.3)

## 2023-12-21 LAB — MICROALBUMIN / CREATININE URINE RATIO
Creatinine,U: 73.1 mg/dL
Microalb Creat Ratio: 108.3 mg/g — ABNORMAL HIGH (ref 0.0–30.0)
Microalb, Ur: 7.9 mg/dL — ABNORMAL HIGH (ref 0.0–1.9)

## 2023-12-21 MED ORDER — FLUTICASONE PROPIONATE 50 MCG/ACT NA SUSP: 2.0000 | Freq: Every morning | NASAL | 6 refills | Status: AC

## 2023-12-21 NOTE — Progress Notes (Signed)
 New Patient Office Visit  Subjective   Patient ID: Joan Vargas, female    DOB: Sep 22, 1955  Age: 68 y.o. MRN: [id number]  CC:  Chief Complaint  Patient presents with   Establish Care    HPI Joan Vargas presents to establish care with new provider.   Patients previous primary care provider: [facility] with Dr. Isaiah Cones  Specialist: [facility]- Dr. Hoy Masters  [facility] with Dr. Delon Lenis  [facility]  [facility]   Diabetes: Chronic. Patient is taking Metformin  2000mg  daily, Ozempic 0.5mg  injection weekly,  Based on patient, A1c 5.6, completed in May. Uses Dexcom 7 to monitor blood sugars, fasting reading has been 84-120.   GERD: Chronic. Patient is prescribed Esomeprazole 20mg  daily. Effective.   Allergic Rhinitis: Chronic. Patient is prescribed Fluticasone  nasal spray 2 sprays every morning. Effective.   Fibromyalgia/Restless leg: Chronic. Patient is prescribed Gabapentin 600mg  at bedtime.   Hypothyroidism: Chronic. Patient is prescribed Levothyroxine  125mcg daily. Had thyroid  surgery.  Last TSH was normal in May according patient.   HTN: Chronic. Patient is prescribed Metoprolol  50mg  daily and Olmesartan/HCTZ 40-12.5mg  daily. Denies monitoring blood pressure at home. Denies CP, SHOB.   Hyperlipidemia: Chronic. Patient is prescribed Rosuvastatin 5mg  every other night.   Lumbar muscle spasms: Chronic. Patient is prescribed Tizanidine 4mg  every 6 hours PRN. Take it rarely. It has been at least 2 months since she took it.  Outpatient Encounter Medications as of 12/21/2023  Medication Sig   acetaminophen  (TYLENOL ) 500 MG tablet Take 500 mg by mouth every 4 (four) hours as needed.   aspirin  81 MG tablet Take 81 mg by mouth at bedtime.    Cholecalciferol (VITAMIN D3) 50 MCG (2000 UT) capsule Take 2,000 Units by mouth daily.   Continuous Glucose Sensor (DEXCOM G7 SENSOR) MISC  CHANGE SENSOR EVERY 10 DAYS   D2000 ULTRA STRENGTH 50 MCG (2000 UT) CAPS Take 1 capsule by mouth at bedtime.   esomeprazole (NEXIUM) 20 MG capsule Take 40 mg by mouth daily.   estradiol  (ESTRACE ) 0.1 MG/GM vaginal cream Place vaginally every Saturday.   gabapentin (NEURONTIN) 300 MG capsule Take 600 mg by mouth at bedtime.   Glucosamine-Chondroitin (MOVE FREE PO) Take 1 capsule by mouth every morning.   levothyroxine  (SYNTHROID ) 125 MCG tablet Take 125 mcg by mouth every morning.   Loratadine  (ALLERGY RELIEF 24-HR PO) Take by mouth.   Menthol , Topical Analgesic, (ICY HOT EX) Apply topically.   metFORMIN  (GLUCOPHAGE ) 1000 MG tablet Take 1,000 mg by mouth 2 (two) times daily with a meal.   metoprolol  succinate (TOPROL -XL) 50 MG 24 hr tablet Take 50 mg by mouth every morning. Take with or immediately following a meal.   montelukast  (SINGULAIR ) 10 MG tablet Take 10 mg by mouth at bedtime.   Multiple Vitamin (MULTIVITAMIN) tablet Take 1 tablet by mouth every morning.    olmesartan-hydrochlorothiazide  (BENICAR HCT) 40-12.5 MG tablet Take 1 tablet by mouth daily.   OVER THE COUNTER MEDICATION Wal-Dryl Allergy 25 mg. One tablet at night.   OVER THE COUNTER MEDICATION Tumeric two tablets one time daily.   OZEMPIC, 0.25 OR 0.5 MG/DOSE, 2 MG/3ML SOPN SMARTSIG:0.45 Milligram(s) SUB-Q Once a Week   Probiotic Product (PROBIOTIC DAILY PO) Take by mouth daily.   Pumpkin Seed-Soy Germ (AZO BLADDER CONTROL/GO-LESS) CAPS Take by mouth.   rosuvastatin (CRESTOR) 5 MG tablet Take 5 mg by mouth every other day.   SM CINNAMON PO  Take 1,000 mg by mouth in the morning and at bedtime.   tiZANidine (ZANAFLEX) 4 MG tablet Take 4 mg by mouth every 6 (six) hours as needed for muscle spasms.   trimethoprim (TRIMPEX) 100 MG tablet trimethoprim 100 mg tabs   Turmeric (QC TUMERIC COMPLEX PO) Take by mouth.   [DISCONTINUED] Diclofenac  Sodium (VOLTAREN  PO) Take by mouth.   [DISCONTINUED] fluticasone  (FLONASE ) 50 MCG/ACT nasal  spray Place 2 sprays into both nostrils in the morning.   [DISCONTINUED] OVER THE COUNTER MEDICATION Biotin 10, 000 mcg one tablet daily.   [DISCONTINUED] phenazopyridine (PYRIDIUM) 97 MG tablet Take 97 mg by mouth 3 (three) times daily as needed for pain.   [DISCONTINUED] pramipexole  (MIRAPEX ) 0.25 MG tablet Take 0.25 mg by mouth at bedtime.   fluticasone  (FLONASE ) 50 MCG/ACT nasal spray Place 2 sprays into both nostrils in the morning.   No facility-administered encounter medications on file as of 12/21/2023.    Past Medical History:  Diagnosis Date   Allergy    Anemia    Anxiety    Arthritis    knees and thumbs    Depression    Diabetes mellitus    diet controlled not on meds    Fibromyalgia    GERD (gastroesophageal reflux disease)    Headache(784.0)    Hyperlipidemia    Hypertension    PONV (postoperative nausea and vomiting)    Restless leg    Thyroid  disease     Past Surgical History:  Procedure Laterality Date   ABDOMINAL HYSTERECTOMY  06/14/1984   APPENDECTOMY  06/14/1954   BLADDER SURGERY     Biopsy with Dr. Sherwood Edison on 02/03/2022   BREAST BIOPSY Left 06/23/2004   CESAREAN SECTION  1984, 1986   KNEE ARTHROSCOPY  06/14/1998   right   OOPHORECTOMY Right    possible cyst on ovary   SHOULDER SURGERY Right    Torn rotary cuff repair 03/02/2022 with Dr. Eva Herring   skin repair     on left shoulder with Dr. Christina Holy on 04/13/2023   THYROIDECTOMY  11/04/2011   Procedure: THYROIDECTOMY;  Surgeon: Krystal CHRISTELLA Spinner, MD;  Location: [facility];  Service: General;  Laterality: N/A;  Total Thyroidectomy   TONSILLECTOMY  1960   TOTAL KNEE ARTHROPLASTY Right 11/09/2013   Procedure: RIGHT TOTAL KNEE ARTHROPLASTY;  Surgeon: Elspeth JONELLE Her, MD;  Location: [facility];  Service: Orthopedics;  Laterality: Right;   UMBILICAL HERNIA REPAIR  1955   VULVECTOMY N/A 05/10/2023   Procedure: WIDE EXCISION VULVECTOMY;  Surgeon: Eldonna Mays, MD;  Location: [facility];   Service: Gynecology;  Laterality: N/A;   WRIST SURGERY     Ganglia Removal    Family History  Problem Relation Age of Onset   Diabetes Mother    Hearing loss Mother    Miscarriages / [place] Mother    Obesity Mother    Arthritis Father    Cancer Father    Early death Father    ADD / ADHD Son    Colon cancer Neg Hx    Stomach cancer Neg Hx    Esophageal cancer Neg Hx    Rectal cancer Neg Hx    Colon polyps Neg Hx    Breast cancer Neg Hx    Ovarian cancer Neg Hx    Endometrial cancer Neg Hx     Social History   Socioeconomic History   Marital status: Married    Spouse name: Not on file   Number of  children: 2   Years of education: Not on file   Highest education level: Associate degree: occupational, technical, or vocational program  Occupational History   Not on file  Tobacco Use   Smoking status: Former    Current packs/day: 0.00    Average packs/day: 0.5 packs/day for 5.0 years (2.5 ttl pk-yrs)    Types: Cigarettes    Start date: 06/14/1976    Quit date: 06/14/1981    Years since quitting: 42.5   Smokeless tobacco: Never  Vaping Use   Vaping status: Never Used  Substance and Sexual Activity   Alcohol  use: Yes    Comment: once every 2-3 months   Drug use: No   Sexual activity: Not Currently    Birth control/protection: None  Other Topics Concern   Not on file  Social History Narrative   Not on file   Social Drivers of Health   Financial Resource Strain: Medium Risk (12/20/2023)   Overall Financial Resource Strain (CARDIA)    Difficulty of Paying Living Expenses: Somewhat hard  Food Insecurity: No Food Insecurity (12/20/2023)   Hunger Vital Sign    Worried About Running Out of Food in the Last Year: Never true    Ran Out of Food in the Last Year: Never true  Transportation Needs: No Transportation Needs (12/20/2023)   PRAPARE - Administrator, Civil Service (Medical): No    Lack of Transportation (Non-Medical): No  Physical Activity: Inactive  (12/20/2023)   Exercise Vital Sign    Days of Exercise per Week: 0 days    Minutes of Exercise per Session: Not on file  Stress: No Stress Concern Present (12/20/2023)   [employer] of Occupational Health - Occupational Stress Questionnaire    Feeling of Stress: Only a little  Social Connections: Moderately Integrated (12/21/2023)   Social Connection and Isolation Panel    Frequency of Communication with Friends and Family: Once a week    Frequency of Social Gatherings with Friends and Family: Once a week    Attends Religious Services: More than 4 times per year    Active Member of [employer] or Organizations: Yes    Attends Engineer, structural: More than 4 times per year    Marital Status: Married  Catering manager Violence: Not At Risk (04/21/2023)   Humiliation, Afraid, Rape, and Kick questionnaire    Fear of Current or Ex-Partner: No    Emotionally Abused: No    Physically Abused: No    Sexually Abused: No    ROS See HPI above    Objective  BP 116/74   Pulse 76   Temp 98.1 F (36.7 C) (Oral)   Ht 5' 1 (1.549 m)   Wt 170 lb (77.1 kg)   SpO2 98%   BMI 32.12 kg/m   Physical Exam    Assessment & Plan:  Encounter to establish care  Gastroesophageal reflux disease without esophagitis  Allergic rhinitis, unspecified seasonality, unspecified trigger -     Fluticasone  Propionate; Place 2 sprays into both nostrils in the morning.  Dispense: 9.9 mL; Refill: 6  Restless leg  Fibromyalgia  Hypothyroidism, postsurgical  HYPERTENSION -     Comprehensive metabolic panel with GFR  Type 2 diabetes mellitus without complication, without long-term current use of insulin  (HCC) -     Microalbumin / creatinine urine ratio -     Comprehensive metabolic panel with GFR  Hyperlipidemia, unspecified hyperlipidemia type -     Comprehensive metabolic panel with GFR  Lumbar pain  1.Review health maintenance:  -Covid booster: UTD, last Fall at [facility]  -Tdap booster:  UTD, [facility]  -Hep C screening: request records with [facility] -Ophthalmology exam: 05/19/2023, requesting records  -PNA vaccine: [facility], request records  -Zoster vaccine: [facility] [address]  2.Recommend to continue all medications. Refilled Flonase . 3. Ordered labs (CMP) based on diabetes, HTN, and hyperlipidemia and microalbumin/creatinine for diabetes. Office will call with results and be available MyChart. All other labs completed in May with previous PCP. Will request records.  4.Advised to please be fasting at next appointment.  -Follow up in 2 months and please be fasting.  Return in about 2 months (around 02/21/2024) for chronic management.   Briar Witherspoon, NP

## 2023-12-21 NOTE — Patient Instructions (Addendum)
-  It was nice to meet you and look forward to taking care of you. -Recommend to continue all medications. Refilled Flonase . -Ordered labs and urine. Office will call with results and be available MyChart.  -Follow up in 2 months and please be fasting.

## 2023-12-26 ENCOUNTER — Encounter: Payer: Self-pay | Admitting: Psychiatry

## 2023-12-26 ENCOUNTER — Ambulatory Visit: Payer: Self-pay | Admitting: Family Medicine

## 2023-12-26 DIAGNOSIS — Z794 Long term (current) use of insulin: Secondary | ICD-10-CM

## 2023-12-26 NOTE — Addendum Note (Signed)
 Addended by: Rimas Gilham B on: 12/26/2023 12:58 PM   Modules accepted: Orders

## 2023-12-27 MED ORDER — EMPAGLIFLOZIN 10 MG PO TABS: 10.0000 mg | ORAL_TABLET | Freq: Every day | ORAL | 1 refills | Status: AC

## 2024-01-26 ENCOUNTER — Ambulatory Visit (INDEPENDENT_AMBULATORY_CARE_PROVIDER_SITE_OTHER): Admitting: Family Medicine

## 2024-01-26 VITALS — BP 106/64

## 2024-01-26 DIAGNOSIS — Z0000 Encounter for general adult medical examination without abnormal findings: Secondary | ICD-10-CM

## 2024-01-26 NOTE — Progress Notes (Signed)
 PATIENT CHECK-IN and HEALTH RISK ASSESSMENT QUESTIONNAIRE:  -completed by phone/video for upcoming Medicare Preventive Visit   Pre-Visit Check-in: 1)Vitals (height, wt, BP, etc) - record in vitals section for visit on day of visit Request home vitals (wt, BP, etc.) and enter into vitals, THEN update Vital Signs SmartPhrase below at the top of the HPI. See below.  2)Review and Update Medications, Allergies PMH, Surgeries, Social history in Epic 3)Hospitalizations in the last year with date/reason? n  4)Review and Update Care Team (patient's specialists) in Epic 5) Complete PHQ9 in Epic  6) Complete Fall Screening in Epic 7)Review all Health Maintenance Due and order if not done.  Medicare Wellness Patient Questionnaire:  Answer theses question about your habits: How often do you have a drink containing alcohol ?less than monthly How many drinks containing alcohol  do you have on a typical day when you are drinking?1-2 How often do you have six or more drinks on one occasion?never Have you ever smoked?yes Quit date if applicable? Quit in her 20w  How many packs a day do/did you smoke? na Do you use smokeless tobacco?n Do you use an illicit drugs?n On average, how many days per week do you engage in moderate to strenuous exercise (like a brisk walk)?no On average, how many minutes do you engage in exercise at this level?na Typical diet: admits doesn't eat very healthy, husband is on coumadin  so avoid greens and doesn't eat much veggies, tries to avoid eating too much bread  Beverages: water , some juice, occ soda  Answer theses question about your everyday activities: Can you perform most household chores?y Are you deaf or have significant trouble hearing?n Do you feel that you have a problem with memory?n Do you feel safe at home?y Last dentist visit?has denture 8. Do you have any difficulty performing your everyday activities?n Are you having any difficulty walking, taking  medications on your own, and or difficulty managing daily home needs?n Do you have difficulty walking or climbing stairs?n Do you have difficulty dressing or bathing?n Do you have difficulty doing errands alone such as visiting a doctor's office or shopping?n Do you currently have any difficulty preparing food and eating?n Do you currently have any difficulty using the toilet?n Do you have any difficulty managing your finances?n Do you have any difficulties with housekeeping of managing your housekeeping?n   Do you have Advanced Directives in place (Living Will, Healthcare Power or Attorney)? y   Last eye Exam and location?[facility], 12/5 24   Do you currently use prescribed or non-prescribed narcotic or opioid pain medications?n  Do you have a history or close family history of breast, ovarian, tubal or peritoneal cancer or a family member with BRCA (breast cancer susceptibility 1 and 2) gene mutations? See PMH/FH     ----------------------------------------------------------------------------------------------------------------------------------------------------------------------------------------------------------------------  Because this visit was a virtual/telehealth visit, some criteria may be missing or patient reported. Any vitals not documented were not able to be obtained and vitals that have been documented are patient reported.    MEDICARE ANNUAL PREVENTIVE VISIT WITH PROVIDER: (Welcome to [facility], initial annual wellness or annual wellness exam)  Virtual Visit via Video Note  I connected with Joan Vargas on 01/26/24 by a video enabled telemedicine application and verified that I am speaking with the correct person using two identifiers.  Location patient: home Location provider:work or home office Persons participating in the virtual visit: patient, provider  Concerns and/or follow up today: sometimes feels a little dizzy when goes from from bending over  to standing. Reports BS has been good. BP a little low on check. Recently started Jardiance . Denies CP, SOB, syncope or other symptoms.    See HM section in Epic for other details of completed HM.    ROS: negative for report of fevers, unintentional weight loss, vision changes, vision loss, hearing loss or change, chest pain, sob, hemoptysis, melena, hematochezia(except with a hemorrhoid), hematuria, falls, bleeding or bruising, thoughts of suicide or self harm, memory loss  Patient-completed extensive health risk assessment - reviewed and discussed with the patient: See Health Risk Assessment completed with patient prior to the visit either above or in recent phone note. This was reviewed in detailed with the patient today and appropriate recommendations, orders and referrals were placed as needed per Summary below and patient instructions.   Review of Medical History: -PMH, PSH, Family History and current specialty and care providers reviewed and updated and listed below   Patient Care Team: Billy Philippe SAUNDERS, NP as PCP - General (Family Medicine)   Past Medical History:  Diagnosis Date   Allergy    Anemia    Anxiety    Arthritis    knees and thumbs    Depression    Diabetes mellitus    diet controlled not on meds    Fibromyalgia    GERD (gastroesophageal reflux disease)    Headache(784.0)    Hyperlipidemia    Hypertension    PONV (postoperative nausea and vomiting)    Restless leg    Thyroid  disease     Past Surgical History:  Procedure Laterality Date   ABDOMINAL HYSTERECTOMY  06/14/1984   APPENDECTOMY  06/14/1954   BLADDER SURGERY     Biopsy with Dr. Sherwood Edison on 02/03/2022   BREAST BIOPSY Left 06/23/2004   CESAREAN SECTION  1984, 1986   KNEE ARTHROSCOPY  06/14/1998   right   OOPHORECTOMY Right    possible cyst on ovary   SHOULDER SURGERY Right    Torn rotary cuff repair 03/02/2022 with Dr. Eva Herring   skin repair     on left shoulder with Dr.  Christina Holy on 04/13/2023   THYROIDECTOMY  11/04/2011   Procedure: THYROIDECTOMY;  Surgeon: Krystal CHRISTELLA Spinner, MD;  Location: [facility];  Service: General;  Laterality: N/A;  Total Thyroidectomy   TONSILLECTOMY  1960   TOTAL KNEE ARTHROPLASTY Right 11/09/2013   Procedure: RIGHT TOTAL KNEE ARTHROPLASTY;  Surgeon: Elspeth SAUNDERS Her, MD;  Location: [facility];  Service: Orthopedics;  Laterality: Right;   UMBILICAL HERNIA REPAIR  1955   VULVECTOMY N/A 05/10/2023   Procedure: WIDE EXCISION VULVECTOMY;  Surgeon: Eldonna Mays, MD;  Location: [facility] [place];  Service: Gynecology;  Laterality: N/A;   WRIST SURGERY     Ganglia Removal    Social History   Socioeconomic History   Marital status: Married    Spouse name: Not on file   Number of children: 2   Years of education: Not on file   Highest education level: Associate degree: occupational, Scientist, product/process development, or vocational program  Occupational History   Not on file  Tobacco Use   Smoking status: Former    Current packs/day: 0.00    Average packs/day: 0.5 packs/day for 5.0 years (2.5 ttl pk-yrs)    Types: Cigarettes    Start date: 06/14/1976    Quit date: 06/14/1981    Years since quitting: 42.6   Smokeless tobacco: Never  Vaping Use   Vaping status: Never Used  Substance and Sexual Activity  Alcohol use: Yes    Comment: once every 2-3 months   Drug use: No   Sexual activity: Not Currently    Birth control/protection: None  Other Topics Concern   Not on file  Social History Narrative   Not on file   Social Drivers of Health   Financial Resource Strain: Medium Risk (12/20/2023)   Overall Financial Resource Strain (CARDIA)    Difficulty of Paying Living Expenses: Somewhat hard  Food Insecurity: No Food Insecurity (12/20/2023)   Hunger Vital Sign    Worried About Running Out of Food in the Last Year: Never true    Ran Out of Food in the Last Year: Never true  Transportation Needs: No Transportation Needs (12/20/2023)   PRAPARE -  Administrator, Civil Service (Medical): No    Lack of Transportation (Non-Medical): No  Physical Activity: Inactive (12/20/2023)   Exercise Vital Sign    Days of Exercise per Week: 0 days    Minutes of Exercise per Session: Not on file  Stress: No Stress Concern Present (12/20/2023)   [employer] of Occupational Health - Occupational Stress Questionnaire    Feeling of Stress: Only a little  Social Connections: Moderately Integrated (12/21/2023)   Social Connection and Isolation Panel    Frequency of Communication with Friends and Family: Once a week    Frequency of Social Gatherings with Friends and Family: Once a week    Attends Religious Services: More than 4 times per year    Active Member of [employer] or Organizations: Yes    Attends Engineer, structural: More than 4 times per year    Marital Status: Married  Catering manager Violence: Not At Risk (04/21/2023)   Humiliation, Afraid, Rape, and Kick questionnaire    Fear of Current or Ex-Partner: No    Emotionally Abused: No    Physically Abused: No    Sexually Abused: No    Family History  Problem Relation Age of Onset   Diabetes Mother    Hearing loss Mother    Miscarriages / [place] Mother    Obesity Mother    Arthritis Father    Cancer Father    Early death Father    ADD / ADHD Son    Colon cancer Neg Hx    Stomach cancer Neg Hx    Esophageal cancer Neg Hx    Rectal cancer Neg Hx    Colon polyps Neg Hx    Breast cancer Neg Hx    Ovarian cancer Neg Hx    Endometrial cancer Neg Hx     Current Outpatient Medications on File Prior to Visit  Medication Sig Dispense Refill   acetaminophen (TYLENOL) 500 MG tablet Take 500 mg by mouth every 4 (four) hours as needed.     aspirin 81 MG tablet Take 81 mg by mouth at bedtime.      Cholecalciferol (VITAMIN D3) 50 MCG (2000 UT) capsule Take 2,000 Units by mouth daily.     Continuous Glucose Sensor (DEXCOM G7 SENSOR) MISC CHANGE SENSOR EVERY 10 DAYS      D2000 ULTRA STRENGTH 50 MCG (2000 UT) CAPS Take 1 capsule by mouth at bedtime.     empagliflozin (JARDIANCE) 10 MG TABS tablet Take 1 tablet (10 mg total) by mouth daily before breakfast. 30 tablet 1   esomeprazole (NEXIUM) 20 MG capsule Take 40 mg by mouth daily.     estradiol (ESTRACE) 0.1 MG/GM vaginal cream Place vaginally every Saturday.  fluticasone  (FLONASE ) 50 MCG/ACT nasal spray Place 2 sprays into both nostrils in the morning. 9.9 mL 6   gabapentin (NEURONTIN) 300 MG capsule Take 600 mg by mouth at bedtime.     Glucosamine-Chondroitin (MOVE FREE PO) Take 1 capsule by mouth every morning.     levothyroxine  (SYNTHROID ) 125 MCG tablet Take 125 mcg by mouth every morning.     Loratadine  (ALLERGY RELIEF 24-HR PO) Take by mouth.     Menthol , Topical Analgesic, (ICY HOT EX) Apply topically.     metFORMIN  (GLUCOPHAGE ) 1000 MG tablet Take 1,000 mg by mouth 2 (two) times daily with a meal.     metoprolol  succinate (TOPROL -XL) 50 MG 24 hr tablet Take 50 mg by mouth every morning. Take with or immediately following a meal.     montelukast  (SINGULAIR ) 10 MG tablet Take 10 mg by mouth at bedtime.     Multiple Vitamin (MULTIVITAMIN) tablet Take 1 tablet by mouth every morning.      olmesartan-hydrochlorothiazide  (BENICAR HCT) 40-12.5 MG tablet Take 1 tablet by mouth daily.     OVER THE COUNTER MEDICATION Wal-Dryl Allergy 25 mg. One tablet at night.     OVER THE COUNTER MEDICATION Tumeric two tablets one time daily.     OZEMPIC, 0.25 OR 0.5 MG/DOSE, 2 MG/3ML SOPN SMARTSIG:0.45 Milligram(s) SUB-Q Once a Week     Probiotic Product (PROBIOTIC DAILY PO) Take by mouth daily.     Pumpkin Seed-Soy Germ (AZO BLADDER CONTROL/GO-LESS) CAPS Take by mouth.     rosuvastatin (CRESTOR) 5 MG tablet Take 5 mg by mouth every other day.     SM CINNAMON PO Take 1,000 mg by mouth in the morning and at bedtime.     tacrolimus  (PROTOPIC ) 0.1 % ointment tacrolimus  topical 0.1% ointment     tiZANidine (ZANAFLEX) 4 MG  tablet Take 4 mg by mouth every 6 (six) hours as needed for muscle spasms.     trimethoprim (TRIMPEX) 100 MG tablet trimethoprim 100 mg tabs     Turmeric (QC TUMERIC COMPLEX PO) Take by mouth.     No current facility-administered medications on file prior to visit.    Allergies  Allergen Reactions   Aspirin  Nausea And Vomiting    REACTION: stomach upset   Sulfa Antibiotics Itching       Physical Exam Vitals requested from patient and listed below if patient had equipment and was able to obtain at home for this virtual visit: Vitals:   01/26/24 1745  BP: 106/64   Estimated body mass index is 32.12 kg/m as calculated from the following:   Height as of 12/21/23: 5' 1 (1.549 m).   Weight as of 12/21/23: 170 lb (77.1 kg).  EKG (optional): deferred due to virtual visit  GENERAL: alert, oriented, no acute distress detected, full vision exam deferred due to pandemic and/or virtual encounter  HEENT: atraumatic, conjunttiva clear, no obvious abnormalities on inspection of external nose and ears  NECK: normal movements of the head and neck  LUNGS: on inspection no signs of respiratory distress, breathing rate appears normal, no obvious gross SOB, gasping or wheezing  CV: no obvious cyanosis  MS: moves all visible extremities without noticeable abnormality  PSYCH/NEURO: pleasant and cooperative, no obvious depression or anxiety, speech and thought processing grossly intact, Cognitive function grossly intact  Flowsheet Row Office Visit from 12/21/2023 in [facility] [place]  PHQ-9 Total Score 3        01/26/2024    5:50 PM 12/21/2023  10:07 AM 04/21/2023   10:34 AM  Depression screen PHQ 2/9  Decreased Interest 0 0 0  Down, Depressed, Hopeless 0 1 0  PHQ - 2 Score 0 1 0  Altered sleeping  1   Tired, decreased energy  1   Change in appetite  0   Feeling bad or failure about yourself   0   Trouble concentrating  0   Moving slowly or fidgety/restless  0    Suicidal thoughts  0   PHQ-9 Score  3   Difficult doing work/chores  Somewhat difficult        01/11/2019    5:19 PM 01/08/2020   12:59 PM 12/21/2023   10:07 AM 01/23/2024    3:20 PM 01/26/2024    5:49 PM  Fall Risk  Falls in the past year? 1  0 0 0 0  Number of falls in past year - Comments Emmi Telephone Survey Actual Response = 20       Was there an injury with Fall? 0  0 0 0  Fall Risk Category Calculator 2  0 0  0  Fall Risk Category (Retired) Moderate       Patient at Risk for Falls Due to   No Fall Risks    Fall risk Follow up   Falls evaluation completed  Falls evaluation completed;Education provided     Patient-reported   Data saved with a previous flowsheet row definition     SUMMARY AND PLAN:  Encounter for Medicare annual wellness exam   Discussed applicable health maintenance/preventive health measures and advised and referred or ordered per patient preferences: She recently changed PCP and reports immunizations, bone density and foot exam are all uptodate. She is working on getting records transferred form prior PCP.  Health Maintenance  Topic Date Due   FOOT EXAM  Never done   Hepatitis C Screening  Never done   DTaP/Tdap/Td (1 - Tdap) Never done   Pneumococcal Vaccine: 50+ Years (1 of 2 - PCV) Never done   Zoster Vaccines- Shingrix (1 of 2) Never done   COVID-19 Vaccine (2 - Moderna risk series) 04/11/2023   INFLUENZA VACCINE  01/13/2024   HEMOGLOBIN A1C  04/19/2024   OPHTHALMOLOGY EXAM  05/18/2024   Colonoscopy  08/12/2024   Diabetic kidney evaluation - eGFR measurement  12/20/2024   Diabetic kidney evaluation - Urine ACR  12/20/2024   Medicare Annual Wellness (AWV)  01/25/2025   MAMMOGRAM  07/31/2025   DEXA SCAN  Completed   HPV VACCINES  Aged Out   Meningococcal B Vaccine  Aged [employer] and counseling on the following was provided based on the above review of health and a plan/checklist for the patient, along with additional information  discussed, was provided for the patient in the patient instructions :   -Provided counseling and plan for difficulty hearing  -Provided counseling and plan for increased risk of falling if applicable per above screening. -Advised to use great caution with changes in positions and to schedule in office visit for evaluation. Advised to monitor BP at home and bring BP log to the visit. She agrees to call to schedule - also sent message to schedulers to assist. Provided safe balance exercises that can be done at home to improve balance and discussed exercise guidelines for adults with include balance exercises at least 3 days per week - see patient instructions.  -Advised and counseled on a healthy lifestyle  -Reviewed patient's current diet.  Advised and counseled on a whole foods based healthy diet. A summary of a healthy diet was provided in the Patient Instructions.  -reviewed patient's current physical activity level and discussed exercise guidelines for adults. Discussed community resources and ideas for safe exercise at home to assist in meeting exercise guideline recommendations in a safe and healthy way.  -Advise yearly regular eye exams   Follow up: see patient instructions     Patient Instructions  I really enjoyed getting to talk with you today! I am available on Tuesdays and Thursdays for virtual visits if you have any questions or concerns, or if I can be of any further assistance.   CHECKLIST FROM ANNUAL WELLNESS VISIT:  -Follow up (please call to schedule if not scheduled after visit):   -please schedule in office visit in the next 1 week and bring blood pressue log   -yearly for annual wellness visit with primary care office  Here is a list of your preventive care/health maintenance measures and the plan for each if any are due:  PLAN For any measures below that may be due:    1. Waiting on records so that we can update the following.   Health Maintenance  Topic Date Due    FOOT EXAM  Never done   Hepatitis C Screening  Never done   DTaP/Tdap/Td (1 - Tdap) Never done   Pneumococcal Vaccine: 50+ Years (1 of 2 - PCV) Never done   Zoster Vaccines- Shingrix (1 of 2) Never done   COVID-19 Vaccine (2 - Moderna risk series) 04/11/2023   INFLUENZA VACCINE  01/13/2024   HEMOGLOBIN A1C  04/19/2024   OPHTHALMOLOGY EXAM  05/18/2024   Colonoscopy  08/12/2024   Diabetic kidney evaluation - eGFR measurement  12/20/2024   Diabetic kidney evaluation - Urine ACR  12/20/2024   Medicare Annual Wellness (AWV)  01/25/2025   MAMMOGRAM  07/31/2025   DEXA SCAN  Completed   HPV VACCINES  Aged Out   Meningococcal B Vaccine  Aged Out    -See a dentist at least yearly  -Get your eyes checked and then per your eye specialist's recommendations  -Other issues addressed today:   -I have included below further information regarding a healthy whole foods based diet, physical activity guidelines for adults, stress management and opportunities for social connections. I hope you find this information useful.   -----------------------------------------------------------------------------------------------------------------------------------------------------------------------------------------------------------------------------------------------------------    NUTRITION: -eat real food: lots of colorful vegetables (half the plate) and fruits -5-7 servings of vegetables and fruits per day (fresh or steamed is best), exp. 2 servings of vegetables with lunch and dinner and 2 servings of fruit per day. Berries and greens such as kale and collards are great choices.  -consume on a regular basis:  fresh fruits, fresh veggies, fish, nuts, seeds, healthy oils (such as olive oil, avocado oil), whole grains (make sure for bread/pasta/crackers/etc., that the first ingredient on label contains the word whole), legumes. -can eat small amounts of dairy and lean meat (no larger than the palm of  your hand), but avoid processed meats such as ham, bacon, lunch meat, etc. -drink water  -try to avoid fast food and pre-packaged foods, processed meat, ultra processed foods/beverages (donuts, candy, etc.) -most experts advise limiting sodium to < 2300mg  per day, should limit further is any chronic conditions such as high blood pressure, heart disease, diabetes, etc. The American Heart Association advised that < 1500mg  is is ideal -try to avoid foods/beverages that contain any ingredients with names you do  not recognize  -try to avoid foods/beverages  with added sugar or sweeteners/sweets  -try to avoid sweet drinks (including diet drinks): soda, juice, Gatorade, sweet tea, power drinks, diet drinks -try to avoid white rice, white bread, pasta (unless whole grain)  EXERCISE GUIDELINES FOR ADULTS: -if you wish to increase your physical activity, do so gradually and with the approval of your doctor -STOP and seek medical care immediately if you have any chest pain, chest discomfort or trouble breathing when starting or increasing exercise  -move and stretch your body, legs, feet and arms when sitting for long periods -Physical activity guidelines for optimal health in adults: -get at least 150 minutes per week of moderate exercise (can talk, but not sing); this is about 20-30 minutes of sustained activity 5-7 days per week or two 10-15 minute episodes of sustained activity 5-7 days per week -do some muscle building/resistance training/strength training at least 2 days per week  -balance exercises 3+ days per week:   Stand somewhere where you have something sturdy to hold onto if you lose balance    1) lift up on toes, then back down, start with 5x per day and work up to 20x   2) stand and lift one leg straight out to the side so that foot is a few inches of the floor, start with 5x each side and work up to 20x each side   3) stand on one foot, start with 5 seconds each side and work up to 20  seconds on each side  If you need ideas or help with getting more active:  -Silver sneakers https://tools.silversneakers.com  -Walk with a Doc: http://www.duncan-williams.com/  -try to include resistance (weight lifting/strength building) and balance exercises twice per week: or the following link for ideas: http://castillo-powell.com/  BuyDucts.dk  STRESS MANAGEMENT: -can try meditating, or just sitting quietly with deep breathing while intentionally relaxing all parts of your body for 5 minutes daily -if you need further help with stress, anxiety or depression please follow up with your primary doctor or contact the wonderful folks at [facility]: [phone]  SOCIAL CONNECTIONS: -options in [place] if you wish to engage in more social and exercise related activities:  -Silver sneakers https://tools.silversneakers.com  -Walk with a Doc: http://www.duncan-williams.com/  -Check out the [facility] Active Adults 50+ section on the [place] of [employer] (hiking clubs, book clubs, cards and games, chess, exercise classes, aquatic classes and much more) - see the website for details: https://www.[place].gov/departments/parks-recreation/active-adults50  -YouTube has lots of exercise videos for different ages and abilities as well  -[facility] (a variety of indoor and outdoor inperson activities for adults). [phone]. [address]  -Virtual Online Classes (a variety of topics): see seniorplanet.org or call [phone]  -consider volunteering at a school, hospice center, church, senior center or elsewhere            Chiquita JONELLE Cramp, DO

## 2024-01-26 NOTE — Patient Instructions (Signed)
 I really enjoyed getting to talk with you today! I am available on Tuesdays and Thursdays for virtual visits if you have any questions or concerns, or if I can be of any further assistance.   CHECKLIST FROM ANNUAL WELLNESS VISIT:  -Follow up (please call to schedule if not scheduled after visit):   -please schedule in office visit in the next 1 week and bring blood pressue log   -yearly for annual wellness visit with primary care office  Here is a list of your preventive care/health maintenance measures and the plan for each if any are due:  PLAN For any measures below that may be due:    1. Waiting on records so that we can update the following.   Health Maintenance  Topic Date Due   FOOT EXAM  Never done   Hepatitis C Screening  Never done   DTaP/Tdap/Td (1 - Tdap) Never done   Pneumococcal Vaccine: 50+ Years (1 of 2 - PCV) Never done   Zoster Vaccines- Shingrix (1 of 2) Never done   COVID-19 Vaccine (2 - Moderna risk series) 04/11/2023   INFLUENZA VACCINE  01/13/2024   HEMOGLOBIN A1C  04/19/2024   OPHTHALMOLOGY EXAM  05/18/2024   Colonoscopy  08/12/2024   Diabetic kidney evaluation - eGFR measurement  12/20/2024   Diabetic kidney evaluation - Urine ACR  12/20/2024   Medicare Annual Wellness (AWV)  01/25/2025   MAMMOGRAM  07/31/2025   DEXA SCAN  Completed   HPV VACCINES  Aged Out   Meningococcal B Vaccine  Aged Out    -See a dentist at least yearly  -Get your eyes checked and then per your eye specialist's recommendations  -Other issues addressed today:   -I have included below further information regarding a healthy whole foods based diet, physical activity guidelines for adults, stress management and opportunities for social connections. I hope you find this information useful.    -----------------------------------------------------------------------------------------------------------------------------------------------------------------------------------------------------------------------------------------------------------    NUTRITION: -eat real food: lots of colorful vegetables (half the plate) and fruits -5-7 servings of vegetables and fruits per day (fresh or steamed is best), exp. 2 servings of vegetables with lunch and dinner and 2 servings of fruit per day. Berries and greens such as kale and collards are great choices.  -consume on a regular basis:  fresh fruits, fresh veggies, fish, nuts, seeds, healthy oils (such as olive oil, avocado oil), whole grains (make sure for bread/pasta/crackers/etc., that the first ingredient on label contains the word whole), legumes. -can eat small amounts of dairy and lean meat (no larger than the palm of your hand), but avoid processed meats such as ham, bacon, lunch meat, etc. -drink water  -try to avoid fast food and pre-packaged foods, processed meat, ultra processed foods/beverages (donuts, candy, etc.) -most experts advise limiting sodium to < 2300mg  per day, should limit further is any chronic conditions such as high blood pressure, heart disease, diabetes, etc. The American Heart Association advised that < 1500mg  is is ideal -try to avoid foods/beverages that contain any ingredients with names you do not recognize  -try to avoid foods/beverages  with added sugar or sweeteners/sweets  -try to avoid sweet drinks (including diet drinks): soda, juice, Gatorade, sweet tea, power drinks, diet drinks -try to avoid white rice, white bread, pasta (unless whole grain)  EXERCISE GUIDELINES FOR ADULTS: -if you wish to increase your physical activity, do so gradually and with the approval of your doctor -STOP and seek medical care immediately if you have any chest pain, chest discomfort  or trouble breathing when starting or  increasing exercise  -move and stretch your body, legs, feet and arms when sitting for long periods -Physical activity guidelines for optimal health in adults: -get at least 150 minutes per week of moderate exercise (can talk, but not sing); this is about 20-30 minutes of sustained activity 5-7 days per week or two 10-15 minute episodes of sustained activity 5-7 days per week -do some muscle building/resistance training/strength training at least 2 days per week  -balance exercises 3+ days per week:   Stand somewhere where you have something sturdy to hold onto if you lose balance    1) lift up on toes, then back down, start with 5x per day and work up to 20x   2) stand and lift one leg straight out to the side so that foot is a few inches of the floor, start with 5x each side and work up to 20x each side   3) stand on one foot, start with 5 seconds each side and work up to 20 seconds on each side  If you need ideas or help with getting more active:  -Silver sneakers https://tools.silversneakers.com  -Walk with a Doc: http://www.duncan-williams.com/  -try to include resistance (weight lifting/strength building) and balance exercises twice per week: or the following link for ideas: http://castillo-powell.com/  BuyDucts.dk  STRESS MANAGEMENT: -can try meditating, or just sitting quietly with deep breathing while intentionally relaxing all parts of your body for 5 minutes daily -if you need further help with stress, anxiety or depression please follow up with your primary doctor or contact the wonderful folks at [facility]: [phone]  SOCIAL CONNECTIONS: -options in [place] if you wish to engage in more social and exercise related activities:  -Silver sneakers https://tools.silversneakers.com  -Walk with a Doc: http://www.duncan-williams.com/  -Check out the [facility] Active Adults 50+  section on the [place] [employer] (hiking clubs, book clubs, cards and games, chess, exercise classes, aquatic classes and much more) - see the website for details: https://www.[place].gov/departments/parks-recreation/active-adults50  -YouTube has lots of exercise videos for different ages and abilities as well  -[facility] (a variety of indoor and outdoor inperson activities for adults). [phone]. [address]  -Virtual Online Classes (a variety of topics): see seniorplanet.org or call [phone]  -consider volunteering at a school, hospice center, church, senior center or elsewhere

## 2024-02-01 ENCOUNTER — Encounter: Payer: Self-pay | Admitting: Family Medicine

## 2024-02-01 ENCOUNTER — Ambulatory Visit: Admitting: Family Medicine

## 2024-02-01 VITALS — BP 108/68 | HR 88 | Temp 98.4°F | Ht 61.0 in | Wt 170.0 lb

## 2024-02-01 DIAGNOSIS — R5383 Other fatigue: Secondary | ICD-10-CM | POA: Diagnosis not present

## 2024-02-01 DIAGNOSIS — Z862 Personal history of diseases of the blood and blood-forming organs and certain disorders involving the immune mechanism: Secondary | ICD-10-CM

## 2024-02-01 DIAGNOSIS — R519 Headache, unspecified: Secondary | ICD-10-CM

## 2024-02-01 DIAGNOSIS — R42 Dizziness and giddiness: Secondary | ICD-10-CM

## 2024-02-01 DIAGNOSIS — I152 Hypertension secondary to endocrine disorders: Secondary | ICD-10-CM

## 2024-02-01 DIAGNOSIS — Z7984 Long term (current) use of oral hypoglycemic drugs: Secondary | ICD-10-CM

## 2024-02-01 DIAGNOSIS — E1159 Type 2 diabetes mellitus with other circulatory complications: Secondary | ICD-10-CM

## 2024-02-01 LAB — CBC WITH DIFFERENTIAL/PLATELET
Basophils Absolute: 0 K/uL (ref 0.0–0.1)
Basophils Relative: 0.7 % (ref 0.0–3.0)
Eosinophils Absolute: 0.1 K/uL (ref 0.0–0.7)
Eosinophils Relative: 2.2 % (ref 0.0–5.0)
HCT: 30.1 % — ABNORMAL LOW (ref 36.0–46.0)
Hemoglobin: 10.2 g/dL — ABNORMAL LOW (ref 12.0–15.0)
Lymphocytes Relative: 26.9 % (ref 12.0–46.0)
Lymphs Abs: 1.4 K/uL (ref 0.7–4.0)
MCHC: 33.7 g/dL (ref 30.0–36.0)
MCV: 87.7 fl (ref 78.0–100.0)
Monocytes Absolute: 0.4 K/uL (ref 0.1–1.0)
Monocytes Relative: 6.9 % (ref 3.0–12.0)
Neutro Abs: 3.4 K/uL (ref 1.4–7.7)
Neutrophils Relative %: 63.3 % (ref 43.0–77.0)
Platelets: 173 K/uL (ref 150.0–400.0)
RBC: 3.44 Mil/uL — ABNORMAL LOW (ref 3.87–5.11)
RDW: 14.5 % (ref 11.5–15.5)
WBC: 5.3 K/uL (ref 4.0–10.5)

## 2024-02-01 LAB — IRON,TIBC AND FERRITIN PANEL
%SAT: 18 % (ref 16–45)
Ferritin: 308 ng/mL — ABNORMAL HIGH (ref 16–288)
Iron: 47 ug/dL (ref 45–160)
TIBC: 267 ug/dL (ref 250–450)

## 2024-02-01 LAB — COMPREHENSIVE METABOLIC PANEL WITH GFR
ALT: 30 U/L (ref 0–35)
AST: 22 U/L (ref 0–37)
Albumin: 4.5 g/dL (ref 3.5–5.2)
Alkaline Phosphatase: 37 U/L — ABNORMAL LOW (ref 39–117)
BUN: 16 mg/dL (ref 6–23)
CO2: 27 meq/L (ref 19–32)
Calcium: 9.9 mg/dL (ref 8.4–10.5)
Chloride: 105 meq/L (ref 96–112)
Creatinine, Ser: 1.15 mg/dL (ref 0.40–1.20)
GFR: 48.24 mL/min — ABNORMAL LOW (ref >60.00)
Glucose, Bld: 108 mg/dL — ABNORMAL HIGH (ref 70–99)
Potassium: 4.9 meq/L (ref 3.5–5.1)
Sodium: 140 meq/L (ref 135–145)
Total Bilirubin: 0.4 mg/dL (ref 0.2–1.2)
Total Protein: 7.2 g/dL (ref 6.0–8.3)

## 2024-02-01 LAB — TSH: TSH: 2.13 u[IU]/mL (ref 0.35–5.50)

## 2024-02-01 LAB — VITAMIN B12: Vitamin B-12: 260 pg/mL (ref 211–911)

## 2024-02-01 LAB — SEDIMENTATION RATE: Sed Rate: 12 mm/h (ref 0–30)

## 2024-02-01 LAB — VITAMIN D 25 HYDROXY (VIT D DEFICIENCY, FRACTURES): VITD: 85.92 ng/mL (ref 30.00–100.00)

## 2024-02-01 LAB — MAGNESIUM: Magnesium: 2 mg/dL (ref 1.5–2.5)

## 2024-02-01 NOTE — Patient Instructions (Addendum)
-  It was good to see you today.  -Recommend to monitor your blood pressure twice a day, once in the morning, and once in the evening. Also, monitor it when having a dizzy episode and note it on your log.  -Recommend to increase water  intake, at least 64 oz of water  a day. -Ordered labs for reason of symptoms. Office will call with lab results and will be available via MyChart.  -EKG completed today. No acute concerns.  -Orthostatics completed today with no concerns.  -Follow up as scheduled in September.

## 2024-02-01 NOTE — Progress Notes (Signed)
 Established Patient Office Visit   Subjective:  Patient ID: Joan Vargas, female    DOB: September 01, 1955  Age: 68 y.o. MRN: [id number]  Chief Complaint  Patient presents with   Hypotension    HPI Patient is following up on low blood pressure. She was advised to follow up from Dr. Luke when she had her AWV. Her blood pressure during that visit was 106/64. She has only monitored her blood pressure two other times, 102/54, and 115/70. Patient is complaining of dizziness, about 3 times a week. Usually becomes dizzy when bending over, but has had it occur when walking. Dizziness started months ago. Denies any SHOB or CP.   Patient does take Metoprolol  XL 50mg  daily and Olmesartan-HCTZ 40-12.5mg  daily. Not under the care of cardiology.   She is also complaining of headaches. Headaches are located in the occipital and vertex area. She started having headaches years ago, but changed in location and frequency recently. Having daily headaches. Sometimes will wake up with a headache. Usually last at least a couple of hours. She takes Tylenol  sometimes and it relieves the pain. Sometimes she will have dizziness. Occasional blurry vision, mostly at night time.  Based on chart review, she had a CT head scan back in 2012 with no acute findings with complaints of headache then. Not seen neurology.  She usually drinks about 50 oz a day of fluid that consist of water , coffee with a little creamer, and un sweet tea. Less water  than other fluids.  ROS See HPI above     Objective:   BP 108/68   Pulse 88   Temp 98.4 F (36.9 C) (Oral)   Ht 5' 1 (1.549 m)   Wt 170 lb (77.1 kg)   SpO2 98%   BMI 32.12 kg/m  BP Readings from Last 3 Encounters:  02/01/24 108/68  01/26/24 106/64  12/21/23 116/74     Physical Exam Vitals reviewed.  Constitutional:      General: She is not in acute distress.    Appearance: Normal appearance. She is obese. She is not ill-appearing, toxic-appearing or diaphoretic.  HENT:      Head: Normocephalic and atraumatic.     Right Ear: Tympanic membrane, ear canal and external ear normal. There is no impacted cerumen.     Left Ear: Tympanic membrane, ear canal and external ear normal. There is no impacted cerumen.  Eyes:     General:        Right eye: No discharge.        Left eye: No discharge.     Extraocular Movements:     Right eye: No nystagmus.     Left eye: No nystagmus.     Conjunctiva/sclera: Conjunctivae normal.     Pupils: Pupils are equal, round, and reactive to light.  Neck:     Thyroid : No thyromegaly.  Cardiovascular:     Rate and Rhythm: Normal rate and regular rhythm.     Heart sounds: Normal heart sounds. No murmur heard.    No friction rub. No gallop.  Pulmonary:     Effort: Pulmonary effort is normal. No respiratory distress.     Breath sounds: Normal breath sounds.  Abdominal:     General: Abdomen is flat. Bowel sounds are normal.     Palpations: Abdomen is soft.  Musculoskeletal:        General: Normal range of motion.     Cervical back: Normal range of motion.     Right lower leg:  No edema.     Left lower leg: No edema.  Lymphadenopathy:     Cervical: No cervical adenopathy.  Skin:    General: Skin is warm and dry.  Neurological:     General: No focal deficit present.     Mental Status: She is alert and oriented to person, place, and time. Mental status is at baseline.     Cranial Nerves: Cranial nerves 2-12 are intact.     Motor: No weakness.  Psychiatric:        Mood and Affect: Mood normal.        Behavior: Behavior normal.        Thought Content: Thought content normal.        Judgment: Judgment normal.   -EKG completed due to dizziness. NSR with no ST elevation, HR 83. Unable to compare to last EKG on 04/2023. EKG before that was on 11/22/2014 with sinus rhythm.     Assessment & Plan:  Increased frequency of headaches -     CBC with Differential/Platelet -     Comprehensive metabolic panel with GFR -     Magnesium -      TSH -     Sedimentation rate  Dizziness -     EKG 12-Lead -     Orthostatic vital signs  History of anemia -     CBC with Differential/Platelet -     Iron, TIBC and Ferritin Panel  Fatigue, unspecified type -     CBC with Differential/Platelet -     Iron, TIBC and Ferritin Panel -     VITAMIN D  25 Hydroxy (Vit-D Deficiency, Fractures) -     Vitamin B12  Hypertension associated with diabetes (HCC)  -Recommend to monitor blood pressure twice a day, once in the morning, and once in the evening. Also, monitor it when having a dizzy episode and note it on log. Would like to see how her blood pressure is ranging before changing blood pressure medication. Blood pressure medication could be a reason for her dizziness. Also, concerned about low fluid intake and any abnormal labs being the reason for dizziness and headache.  -Recommend to increase water  intake, at least 64 oz of water  a day. -Ordered labs (CBC, CMP, Iron panel, Vitamin D /B12, Magnesium, and Sed Rate) based on headaches, dizziness, history of anemia, and fatigue.  -EKG completed today. No acute concerns.  -Orthostatics completed today with no concerns.  -Follow up as scheduled in September.   Audel Coakley, NP

## 2024-02-02 ENCOUNTER — Ambulatory Visit: Payer: Self-pay | Admitting: Family Medicine

## 2024-02-14 ENCOUNTER — Other Ambulatory Visit: Payer: Self-pay

## 2024-02-14 DIAGNOSIS — E0822 Diabetes mellitus due to underlying condition with diabetic chronic kidney disease: Secondary | ICD-10-CM

## 2024-02-14 DIAGNOSIS — E119 Type 2 diabetes mellitus without complications: Secondary | ICD-10-CM

## 2024-02-14 MED ORDER — DEXCOM G7 SENSOR MISC: 0 refills | Status: DC

## 2024-02-21 ENCOUNTER — Encounter: Payer: Self-pay | Admitting: Family Medicine

## 2024-02-21 ENCOUNTER — Ambulatory Visit: Admitting: Family Medicine

## 2024-02-21 VITALS — BP 98/64 | HR 90 | Temp 97.9°F | Ht 61.0 in | Wt 162.0 lb

## 2024-02-21 DIAGNOSIS — R42 Dizziness and giddiness: Secondary | ICD-10-CM

## 2024-02-21 DIAGNOSIS — R809 Proteinuria, unspecified: Secondary | ICD-10-CM

## 2024-02-21 DIAGNOSIS — E1129 Type 2 diabetes mellitus with other diabetic kidney complication: Secondary | ICD-10-CM

## 2024-02-21 DIAGNOSIS — E1159 Type 2 diabetes mellitus with other circulatory complications: Secondary | ICD-10-CM | POA: Diagnosis not present

## 2024-02-21 DIAGNOSIS — Z23 Encounter for immunization: Secondary | ICD-10-CM | POA: Diagnosis not present

## 2024-02-21 DIAGNOSIS — I152 Hypertension secondary to endocrine disorders: Secondary | ICD-10-CM

## 2024-02-21 DIAGNOSIS — E785 Hyperlipidemia, unspecified: Secondary | ICD-10-CM | POA: Diagnosis not present

## 2024-02-21 DIAGNOSIS — R519 Headache, unspecified: Secondary | ICD-10-CM | POA: Diagnosis not present

## 2024-02-21 MED ORDER — METOPROLOL SUCCINATE ER 25 MG PO TB24: 25.0000 mg | ORAL_TABLET | Freq: Every day | ORAL | Status: DC

## 2024-02-21 MED ORDER — ROSUVASTATIN CALCIUM 5 MG PO TABS: 5.0000 mg | ORAL_TABLET | ORAL | 1 refills | Status: AC

## 2024-02-21 NOTE — Patient Instructions (Signed)
-  It was good to see you today.  -Discontinue Jardiance  10mg  daily to see if this helps with bladder pain.  -Headaches and dizziness have improved. Recommend to continue to drink 64-100oz of water  a day.  -Concern with still having some low blood pressures. Decreased Metoprolol  to 25mg  from 50mg  to see if this helps with blood pressure. Continue Olmesartan-Hydrochlorothiazide  40-12.5mg  daily. Continue to monitor blood pressure twice daily and bring in readings to next appointment.  -Refilled Rosuvastatin  for high cholesterol.  -Influenza vaccine given today.  -Follow up in 2 weeks.

## 2024-02-21 NOTE — Progress Notes (Unsigned)
 Established Patient Office Visit   Subjective:  Patient ID: Joan Vargas, female    DOB: May 12, 1956  Age: 68 y.o. MRN: [id number]  Chief Complaint  Patient presents with   Medical Management of Chronic Issues    2 week follow up     HPI Patient was started on Jardiance  right after lab results on 07/09 due to elevated microalbumin/creatine ratio. She reports since starting medication, she thinks she is having increased bladder pain. She is under the care of [facility]. Last appt was on June 30 and next appointment Sept 30 th. She has increased her water  intake since last appointment.   Patient has brought in her BP readings. Ranging 83-130/52-72. A majority of her BPs are over 100/60. Since last appointment, headaches have improved and only had one episode of dizziness. Reports this occurred during the night when getting up to urinate, unable to check her BP.   ROS See HPI above     Objective:   BP 98/64   Pulse 90   Temp 97.9 F (36.6 C) (Oral)   Ht 5' 1 (1.549 m)   Wt 162 lb (73.5 kg)   SpO2 99%   BMI 30.61 kg/m  Wt Readings from Last 3 Encounters:  02/21/24 162 lb (73.5 kg)  02/01/24 170 lb (77.1 kg)  12/21/23 170 lb (77.1 kg)      Physical Exam Vitals reviewed.  Constitutional:      General: She is not in acute distress.    Appearance: Normal appearance. She is obese. She is not ill-appearing, toxic-appearing or diaphoretic.  HENT:     Head: Normocephalic and atraumatic.  Eyes:     General:        Right eye: No discharge.        Left eye: No discharge.     Conjunctiva/sclera: Conjunctivae normal.  Cardiovascular:     Rate and Rhythm: Normal rate and regular rhythm.     Pulses:          Dorsalis pedis pulses are 2+ on the right side and 2+ on the left side.     Heart sounds: Normal heart sounds. No murmur heard.    No friction rub. No gallop.  Pulmonary:     Effort: Pulmonary effort is normal. No respiratory distress.     Breath sounds: Normal  breath sounds.  Musculoskeletal:        General: Normal range of motion.  Feet:     Right foot:     Protective Sensation: 10 sites tested.  10 sites sensed.     Skin integrity: Skin integrity normal.     Toenail Condition: Right toenails are normal.     Left foot:     Protective Sensation: 10 sites tested.  10 sites sensed.     Skin integrity: Skin integrity normal.     Toenail Condition: Left toenails are normal.  Skin:    General: Skin is warm and dry.  Neurological:     General: No focal deficit present.     Mental Status: She is alert and oriented to person, place, and time. Mental status is at baseline.  Psychiatric:        Mood and Affect: Mood normal.        Behavior: Behavior normal.        Thought Content: Thought content normal.        Judgment: Judgment normal.      Assessment & Plan:  Hyperlipidemia, unspecified hyperlipidemia type -  Rosuvastatin  Calcium ; Take 1 tablet (5 mg total) by mouth every other day.  Dispense: 90 tablet; Refill: 1  Hypertension associated with diabetes (HCC) -     Metoprolol  Succinate ER; Take 1 tablet (25 mg total) by mouth daily.  Dizziness  Increased frequency of headaches  Type 2 diabetes mellitus with diabetic microalbuminuria, without long-term current use of insulin  (HCC)    Return in about 2 weeks (around 03/06/2024) for follow-up.   Lamontae Ricardo, NP

## 2024-02-23 DIAGNOSIS — E785 Hyperlipidemia, unspecified: Secondary | ICD-10-CM | POA: Insufficient documentation

## 2024-02-23 NOTE — Assessment & Plan Note (Signed)
 Discontinue Jardiance  to see if this helps with bladder pain. Jardiance  was provided due to elevated microalbumin in urine. Still taking Ozempic for diabetes control. Foot exam completed today.

## 2024-02-23 NOTE — Assessment & Plan Note (Signed)
 Refilled Rosuvastatin  5mg  every other day.

## 2024-02-23 NOTE — Assessment & Plan Note (Signed)
-  Concern with still having some low blood pressures. Decreased Metoprolol  to 25mg  from 50mg  to see if this helps with blood pressure. Continue Olmesartan-Hydrochlorothiazide  40-12.5mg  daily. Continue to monitor blood pressure twice daily and bring in readings to next appointment.

## 2024-03-06 ENCOUNTER — Ambulatory Visit (INDEPENDENT_AMBULATORY_CARE_PROVIDER_SITE_OTHER): Admitting: Family Medicine

## 2024-03-06 ENCOUNTER — Encounter: Payer: Self-pay | Admitting: Family Medicine

## 2024-03-06 VITALS — BP 100/62 | HR 89 | Temp 98.1°F | Ht 61.0 in | Wt 162.0 lb

## 2024-03-06 DIAGNOSIS — E1129 Type 2 diabetes mellitus with other diabetic kidney complication: Secondary | ICD-10-CM | POA: Diagnosis not present

## 2024-03-06 DIAGNOSIS — E1159 Type 2 diabetes mellitus with other circulatory complications: Secondary | ICD-10-CM

## 2024-03-06 DIAGNOSIS — R809 Proteinuria, unspecified: Secondary | ICD-10-CM | POA: Diagnosis not present

## 2024-03-06 DIAGNOSIS — I152 Hypertension secondary to endocrine disorders: Secondary | ICD-10-CM | POA: Diagnosis not present

## 2024-03-06 NOTE — Progress Notes (Signed)
 Established Patient Office Visit   Subjective:  Patient ID: Joan Vargas, female    DOB: Mar 18, 1956  Age: 68 y.o. MRN: [id number]  Chief Complaint  Patient presents with   Medical Management of Chronic Issues    2 week follow up    HPI Patient is here for a 2 week follow up. On last appointment there was concern that she was still having some low blood pressures. Decreased Metoprolol  to 25mg  from 50mg  to see if this would help while continuing Olmesartan-HCTZ 40-12.5mg  daily. She has took medication this morning. She has brought in her readings today, ranging 69-107/47-97. HR 82-105. Had 6 episodes of lightheadedness in the last two weeks. Also, complaining of generalized fatigue.   Also, discontinued Jardiance  to see if this helps with bladder pain that was experiencing. Jardiance  was added to help with elevated microalbumin in urine. She reports her pain has improved very little, but has an appointment with [facility] on 09/30.  ROS See HPI above     Objective:   BP 100/62   Pulse 89   Temp 98.1 F (36.7 C) (Oral)   Ht 5' 1 (1.549 m)   Wt 162 lb (73.5 kg)   SpO2 98%   BMI 30.61 kg/m    Physical Exam Vitals reviewed.  Constitutional:      General: She is not in acute distress.    Appearance: Normal appearance. She is obese. She is not ill-appearing, toxic-appearing or diaphoretic.  HENT:     Head: Normocephalic and atraumatic.  Eyes:     General:        Right eye: No discharge.        Left eye: No discharge.     Conjunctiva/sclera: Conjunctivae normal.  Cardiovascular:     Rate and Rhythm: Normal rate and regular rhythm.     Heart sounds: Normal heart sounds. No murmur heard.    No friction rub. No gallop.  Pulmonary:     Effort: Pulmonary effort is normal. No respiratory distress.     Breath sounds: Normal breath sounds.  Musculoskeletal:        General: Normal range of motion.  Skin:    General: Skin is warm and dry.  Neurological:     General: No  focal deficit present.     Mental Status: She is alert and oriented to person, place, and time. Mental status is at baseline.  Psychiatric:        Mood and Affect: Mood normal.        Behavior: Behavior normal.        Thought Content: Thought content normal.        Judgment: Judgment normal.      Assessment & Plan:  Type 2 diabetes mellitus with diabetic microalbuminuria, without long-term current use of insulin  (HCC) Assessment & Plan: -Continue to not take Jardiance  until seen by urologist to inquire about his opinion with taking Jardiance .    Hypertension associated with diabetes (HCC) Assessment & Plan: -Blood pressure is still low. Discontinue Metoprolol . Continue to take Olmesartan-Hydrochlorothiazide  40-12.5mg  daily. Continue to monitor blood pressure twice a day and bring in reading in 2 weeks.  As for fatigue that she is having, HGB/HCT were slightly low, iron panel had a increase in ferritin levels. TSH, Vitamin B12, D were normal. May need to recheck these levels at next visit. Also, mentioned that she may need a further workup with cardiology.     Return in about 2 weeks (around 03/20/2024) for  follow-up.   Adeli Frost, NP

## 2024-03-06 NOTE — Patient Instructions (Addendum)
-  It was good to see you today.  -Blood pressure is still low. Discontinue Metoprolol . Continue to take Olmesartan-Hydrochlorothiazide  40-12.5mg  daily. Continue to monitor blood pressure twice a day and bring in reading in 2 weeks. -Continue to not take Jardiance  until seen by urologist to inquire about his opinion with taking Jardiance .  -Follow up in 2 weeks.

## 2024-03-06 NOTE — Assessment & Plan Note (Signed)
-  Continue to not take Jardiance  until seen by urologist to inquire about his opinion with taking Jardiance .

## 2024-03-06 NOTE — Assessment & Plan Note (Signed)
-  Blood pressure is still low. Discontinue Metoprolol . Continue to take Olmesartan-Hydrochlorothiazide  40-12.5mg  daily. Continue to monitor blood pressure twice a day and bring in reading in 2 weeks.  As for fatigue that she is having, HGB/HCT were slightly low, iron panel had a increase in ferritin levels. TSH, Vitamin B12, D were normal. May need to recheck these levels at next visit. Also, mentioned that she may need a further workup with cardiology.

## 2024-03-07 ENCOUNTER — Telehealth: Payer: Self-pay

## 2024-03-07 NOTE — Progress Notes (Signed)
   03/07/2024  Patient ID: Joan Vargas, female   DOB: 12/05/1955, 68 y.o.   MRN: [id number]  Received notice from PCP that patient is currently enrolled in [employer] [facility] for Ozempic 0.5mg . Has recently transferred care to our office and needs PCP info updated so that next refill ships to our office.  Submitted form via fax. Confirmation received. Routing to [facility] Med Assist team so she can be included in the re-enrollment process later this year.  Jon VEAR Lindau, PharmD Clinical Pharmacist [phone]

## 2024-03-09 ENCOUNTER — Telehealth: Payer: Self-pay

## 2024-03-09 NOTE — Telephone Encounter (Signed)
 Copied from CRM [id number]. Topic: Clinical - Medication Question >> Mar 09, 2024  8:52 AM Robinson H wrote: Reason for CRM: Patient is calling to speak with providers nurse regarding Ozempic 0.5mg , following up to see if she needs to come in and complete the forms for the medication or what's the status. Patient states she'll be in town today after 1 pm if she needs to stop by.  Davan [phone]

## 2024-03-09 NOTE — Telephone Encounter (Signed)
 Notified pt on form is completed

## 2024-03-13 DIAGNOSIS — N3020 Other chronic cystitis without hematuria: Secondary | ICD-10-CM | POA: Diagnosis not present

## 2024-03-13 DIAGNOSIS — R350 Frequency of micturition: Secondary | ICD-10-CM | POA: Diagnosis not present

## 2024-03-14 ENCOUNTER — Other Ambulatory Visit: Payer: Self-pay | Admitting: Urology

## 2024-03-14 ENCOUNTER — Telehealth: Payer: Self-pay | Admitting: *Deleted

## 2024-03-14 NOTE — Telephone Encounter (Signed)
 Copied from CRM #[id number]. Topic: Clinical - Medical Advice >> Mar 14, 2024 11:29 AM Mia F wrote: Reason for CRM: Pt would like to inform NP Philippe that she will be having surgery on 11/4. Not sure if she need anything. Pt would like to know if she has had a EKG done

## 2024-03-15 NOTE — Telephone Encounter (Signed)
 Called pt and pt sated her pre surgical work up will be at [facility] long and no appointment was needed pt see pcp in 1 week and will discuss and give copy of EKG.

## 2024-03-20 ENCOUNTER — Encounter: Payer: Self-pay | Admitting: Family Medicine

## 2024-03-20 ENCOUNTER — Ambulatory Visit (INDEPENDENT_AMBULATORY_CARE_PROVIDER_SITE_OTHER): Admitting: Family Medicine

## 2024-03-20 VITALS — BP 108/68 | HR 93 | Temp 98.0°F | Ht 61.0 in | Wt 161.0 lb

## 2024-03-20 DIAGNOSIS — E1159 Type 2 diabetes mellitus with other circulatory complications: Secondary | ICD-10-CM

## 2024-03-20 DIAGNOSIS — I959 Hypotension, unspecified: Secondary | ICD-10-CM

## 2024-03-20 DIAGNOSIS — R5383 Other fatigue: Secondary | ICD-10-CM | POA: Diagnosis not present

## 2024-03-20 DIAGNOSIS — I152 Hypertension secondary to endocrine disorders: Secondary | ICD-10-CM

## 2024-03-20 DIAGNOSIS — R42 Dizziness and giddiness: Secondary | ICD-10-CM | POA: Diagnosis not present

## 2024-03-20 MED ORDER — OLMESARTAN MEDOXOMIL 40 MG PO TABS: 40.0000 mg | ORAL_TABLET | Freq: Every day | ORAL | 0 refills | Status: DC

## 2024-03-20 NOTE — Patient Instructions (Addendum)
-  It was good to see you today. -Recommend to discontinue Hydrochlorothiazide  while continuing to take Olmesartan 40mg  daily. Sent in a new prescription of Olmesartan. Recommend to continue monitoring blood pressures for the next 2 weeks and follow up with readings. -Placed a referral to cardiology for the hypotension, dizziness, and fatigue.

## 2024-03-20 NOTE — Progress Notes (Signed)
 Established Patient Office Visit   Subjective:  Patient ID: Joan Vargas, female    DOB: Jan 06, 1956  Age: 68 y.o. MRN: [id number]  Chief Complaint  Patient presents with   Medical Management of Chronic Issues    2 week follow up     HPI Patient is following up on hypotension. At last visit, Metoprolol  was discontinued while still taking Olmesartan-HXTZ 40-12.5mg  daily. She has brought in readings, ranging 84-113/54-70. She reports she is still having episodes of dizziness and fatigue that started about a month ago. She was initially seen for hypotension on 08/20. At that visit, she was experiencing dizziness about 3 times a week, usually when bending over, but has occurred when ambulating. Dizziness started months ago. Denies CP and SHOB. However, fatigue was mentioned briefly at the end of the visit prior to this visit. She is uncertain if the fatigue is coming from not getting enough sleep. She is having to get up on a regular basis to urinate. She is scheduled for a cystoscopy/HOD of the bladder with possible biopsy on 11 at 14:00. Has post-op appointment at 9:30am on 11/18.   Labs were last collected on 08/20 with stable results as a reason for fatigue. Ferritin was up a little to 308. Kidney function was decreased. Still holding on Jardiance  for kidney protection with having pain and upcoming procedure.  ROS See HPI above     Objective:   BP 108/68   Pulse 93   Temp 98 F (36.7 C) (Oral)   Ht 5' 1 (1.549 m)   Wt 161 lb (73 kg)   SpO2 98%   BMI 30.42 kg/m    Physical Exam Vitals reviewed.  Constitutional:      General: She is not in acute distress.    Appearance: Normal appearance. She is obese. She is not ill-appearing, toxic-appearing or diaphoretic.  HENT:     Head: Normocephalic and atraumatic.  Eyes:     General:        Right eye: No discharge.        Left eye: No discharge.     Conjunctiva/sclera: Conjunctivae normal.  Cardiovascular:     Rate and Rhythm:  Normal rate and regular rhythm.     Heart sounds: Normal heart sounds. No murmur heard.    No friction rub. No gallop.  Pulmonary:     Effort: Pulmonary effort is normal. No respiratory distress.     Breath sounds: Normal breath sounds.  Musculoskeletal:        General: Normal range of motion.  Skin:    General: Skin is warm and dry.  Neurological:     General: No focal deficit present.     Mental Status: She is alert and oriented to person, place, and time. Mental status is at baseline.  Psychiatric:        Mood and Affect: Mood normal.        Behavior: Behavior normal.        Thought Content: Thought content normal.        Judgment: Judgment normal.      Assessment & Plan:  Hypertension associated with diabetes (HCC) -     Olmesartan Medoxomil; Take 1 tablet (40 mg total) by mouth daily.  Dispense: 30 tablet; Refill: 0  Dizziness -     Ambulatory referral to Cardiology  Fatigue, unspecified type -     Ambulatory referral to Cardiology  Hypotension, unspecified hypotension type -     Ambulatory referral to  Cardiology  -Recommend to discontinue Hydrochlorothiazide  while continuing to take Olmesartan 40mg  daily. Sent in a new prescription of Olmesartan. Recommend to continue monitoring blood pressures for the next 2 weeks and follow up with readings. -Placed a referral to cardiology for the hypotension, dizziness, and fatigue for further evaluation and rule out cardiology etiology.  Return in about 2 weeks (around 04/03/2024) for follow-up.:   Janiene Aarons, NP

## 2024-04-03 ENCOUNTER — Telehealth: Payer: Self-pay

## 2024-04-03 ENCOUNTER — Encounter: Payer: Self-pay | Admitting: Family Medicine

## 2024-04-03 ENCOUNTER — Ambulatory Visit: Admitting: Family Medicine

## 2024-04-03 VITALS — BP 98/60 | HR 100 | Temp 98.1°F | Ht 61.0 in | Wt 158.0 lb

## 2024-04-03 DIAGNOSIS — R42 Dizziness and giddiness: Secondary | ICD-10-CM | POA: Diagnosis not present

## 2024-04-03 DIAGNOSIS — E1159 Type 2 diabetes mellitus with other circulatory complications: Secondary | ICD-10-CM

## 2024-04-03 DIAGNOSIS — I152 Hypertension secondary to endocrine disorders: Secondary | ICD-10-CM | POA: Diagnosis not present

## 2024-04-03 DIAGNOSIS — Z7985 Long-term (current) use of injectable non-insulin antidiabetic drugs: Secondary | ICD-10-CM

## 2024-04-03 DIAGNOSIS — E1129 Type 2 diabetes mellitus with other diabetic kidney complication: Secondary | ICD-10-CM

## 2024-04-03 DIAGNOSIS — R809 Proteinuria, unspecified: Secondary | ICD-10-CM

## 2024-04-03 NOTE — Progress Notes (Unsigned)
 Established Patient Office Visit   Subjective:  Patient ID: Joan Vargas, female    DOB: 03/21/1956  Age: 68 y.o. MRN: [id number]  Chief Complaint  Patient presents with   Medical Management of Chronic Issues    2 week follow up     HPI Patient is following up on hypotension. At last appointment, discontinued HCTZ 12.5mg  while continuing to take Olmesartan 40mg . Two visits prior, discontinued Metoprolol . She has been monitoring her blood pressures at home. Ranging 90-113/57-72, with one systolic pressure of 83. Noticed her heart rate is increasing. Ranging 91-114. She contributes this to the pain she has been experiencing from her bladder. She is scheduled for a cystoscopy/HOD of the bladder with possible biopsy on 11/04.Still having episodes of dizziness and lightheaded, mostly when bending over. But, reports the episodes have not been as frequent and severe in the last two weeks. Denies CP and SHOB. She does report she is experiencing headaches, but they have been chronic for years. Headaches usually occur 4-5 times a week. Some mornings wake up with headaches other times it comes on gradually. Varies in location of the head. Described as pressure. Lightheadedness and dizzy usually when bending over. Usually take Tylenol , sometime rapid release seems to help. Denies any blurry vision. Denies headaches changing, have been about the same for years.   Patient reports she has not received her shipment of Ozempic that she receives from patient assistance.  ROS See HPI above     Objective:   BP 98/60   Pulse 100   Temp 98.1 F (36.7 C) (Oral)   Ht 5' 1 (1.549 m)   Wt 158 lb (71.7 kg)   SpO2 98%   BMI 29.85 kg/m  Wt Readings from Last 3 Encounters:  04/03/24 158 lb (71.7 kg)  03/20/24 161 lb (73 kg)  03/06/24 162 lb (73.5 kg)   Physical Exam Vitals reviewed.  Constitutional:      General: She is not in acute distress.    Appearance: Normal appearance. She is overweight. She is not  ill-appearing, toxic-appearing or diaphoretic.  HENT:     Head: Normocephalic and atraumatic.  Eyes:     General:        Right eye: No discharge.        Left eye: No discharge.     Conjunctiva/sclera: Conjunctivae normal.  Cardiovascular:     Rate and Rhythm: Normal rate and regular rhythm.     Heart sounds: Normal heart sounds. No murmur heard.    No friction rub. No gallop.  Pulmonary:     Effort: Pulmonary effort is normal. No respiratory distress.     Breath sounds: Normal breath sounds.  Musculoskeletal:        General: Normal range of motion.  Skin:    General: Skin is warm and dry.  Neurological:     General: No focal deficit present.     Mental Status: She is alert and oriented to person, place, and time. Mental status is at baseline.  Psychiatric:        Mood and Affect: Mood normal.        Behavior: Behavior normal.        Thought Content: Thought content normal.        Judgment: Judgment normal.      Assessment & Plan:  Hypertension associated with diabetes (HCC)  Dizziness  Type 2 diabetes mellitus with diabetic microalbuminuria, without long-term current use of insulin  (HCC)  -At last appointment, cardiology  referral had been made for dizziness, fatigue, and hypotension. Advised to please call the following office to schedule that appointment.  [facility] [address], [place], [place] [address]  [phone] -Blood pressures are improving with less dizziness and lightheaded. Through shared decision making decided to continue with Olmesartan 40mg  daily, but add back Metoprolol  12.5mg  (1/2 25mg  tablet) for heart rate control since heart rate is increasing again.  -Continue to monitor blood pressure and heart rate. Bring readings to next appointment in 1 month. -During visit, patient was provided a sample of Ozempic to take until shipment arrives. Jon, with pharmacy called to verify that medication will be  sent.  Return in about 1 month (around 05/04/2024) for follow-up.   Evarose Altland, NP

## 2024-04-03 NOTE — Progress Notes (Signed)
   04/03/2024  Patient ID: Joan Vargas, female   DOB: 1955/10/03, 68 y.o.   MRN: [id number]  Followed up with [employer] regarding patient's delayed shipment of Ozempic 0.5mg  (ordered on 03/07/24). Company reported a dose was not checked on the form.  Had representative re-check the form as a dose was selected. Confirmed 0.5mg  dose was selected. Order is in process, will take 10-14 business days to arrive. Patient was given a sample box while we await shipment.  Jon VEAR Lindau, PharmD Clinical Pharmacist [phone]

## 2024-04-04 NOTE — Patient Instructions (Addendum)
-  It was good to see you.  -At last appointment, cardiology referral had been made for dizziness, fatigue, and hypotension Please call the following office to schedule that appointment.  [facility] at [facility] [address], [place], [place] [address]  [phone] -Blood pressures are improving with less dizziness and lightheaded. Through shared decision making decided to continue with Olmesartan 40mg  daily, but add back Metoprolol  12.5mg  (1/2 25mg  tablet) for heart rate since heart rate is increasing again.  -Continue to monitor blood pressure and heart rate. Bring readings to your next appointment in 1 month. -During visit, patient was provided a sample of Ozempic to take until shipment arrives.  -I hope your upcoming surgery goes well.

## 2024-04-05 ENCOUNTER — Telehealth: Payer: Self-pay

## 2024-04-05 NOTE — Telephone Encounter (Signed)
 Call [facility] [facility] pt has not received refill on ozempic spoke with representative explain we need to submitted a new application on provider portion due to provider has change as a new provider and new address if provider had the same address as the old provider we can use the refill re-order form but since it is a new dr and new address it needs to be a new application they just need pgs 7,8.9.email to [facility] Angela.

## 2024-04-10 NOTE — Telephone Encounter (Signed)
 [employer] spoke with a representative explain they have mail out 2x to the previous provider and the office was closed both time,since the pt has switch provider [employer] needs a letter stating pt has swith provider since August and will make the change to the new provider. Other wise they wont deliver the medication.

## 2024-04-10 NOTE — Progress Notes (Addendum)
 Anesthesia Review:  PCP: philippe Slade  , NP LOV 04/03/24  Cardiologist : none   PPM/ ICD: Device Orders: Rep Notified:  Chest x-ray : EKG : 02/03/24  Echo : Stress test: Cardiac Cath :   Activity level: can do a flight of stairs without difficutly  Sleep Study/ CPAP : none  Fasting Blood Sugar :      / Checks Blood Sugar -- times a day:     DM- type 2- Dexcom monitor  Hgba1c-  04/13/24-  4.6  Jardiance - last dose 1.5 months ago per MD.  PT states MD states will restart after surgery  Metformin - none am of surgery  Ozempic- Last dose on 04/04/24   Blood Thinner/ Instructions /Last Dose: ASA / Instructions/ Last Dose :   81 mg aspirin   Copy of pt's blood pressure readings and pulse from home readings.    Med list also in back of chart.     CBC done 04/13/24 rotued to DR MacDiarmid on 04/13/24.

## 2024-04-10 NOTE — Patient Instructions (Addendum)
 SURGICAL WAITING ROOM VISITATION  Patients having surgery or a procedure may have no more than 2 support people in the waiting area - these visitors may rotate.    Children under the age of 82 must have an adult with them who is not the patient.  Visitors with respiratory illnesses are discouraged from visiting and should remain at home.  If the patient needs to stay at the hospital during part of their recovery, the visitor guidelines for inpatient rooms apply. Pre-op nurse will coordinate an appropriate time for 1 support person to accompany patient in pre-op.  This support person may not rotate.    Please refer to the [facility] website for the visitor guidelines for Inpatients (after your surgery is over and you are in a regular room).       Your procedure is scheduled on: 04/17/2024    Report to [facility] Main Entrance    Report to admitting at   1200 noon    Call this number if you have problems the morning of surgery [phone]   Do not eat food :After Midnight.   After Midnight you may have the following liquids until _ 1100_____ AM  DAY OF SURGERY  Water  Non-Citrus Juices (without pulp, NO RED-Apple, White grape, White cranberry) Black Coffee (NO MILK/CREAM OR CREAMERS, sugar ok)  Clear Tea (NO MILK/CREAM OR CREAMERS, sugar ok) regular and decaf                             Plain Jell-O (NO RED)                                           Fruit ices (not with fruit pulp, NO RED)                                     Popsicles (NO RED)                                                               Sports drinks like Gatorade (NO RED)                             If you have questions, please contact your surgeon's office.     Oral Hygiene is also important to reduce your risk of infection.                                    Remember - BRUSH YOUR TEETH THE MORNING OF SURGERY WITH YOUR REGULAR TOOTHPASTE  DENTURES WILL BE REMOVED PRIOR TO SURGERY PLEASE DO NOT  APPLY Poly grip OR ADHESIVES!!!   Do NOT smoke after Midnight   Stop all vitamins and herbal supplements 7 days before surgery.   Take these medicines the morning of surgery with A SIP OF WATER :  nexium, flonase , synthroid , toprol ,               Jardiance - hold  for 72 hours prior to surgery.  Last dose on               Metformin - none am of surgery              Ozempic- last dose on   DO NOT TAKE ANY ORAL DIABETIC MEDICATIONS DAY OF YOUR SURGERY  Bring CPAP mask and tubing day of surgery.                              You may not have any metal on your body including hair pins, jewelry, and body piercing             Do not wear make-up, lotions, powders, perfumes/cologne, or deodorant  Do not wear nail polish including gel and S&S, artificial/acrylic nails, or any other type of covering on natural nails including finger and toenails. If you have artificial nails, gel coating, etc. that needs to be removed by a nail salon please have this removed prior to surgery or surgery may need to be canceled/ delayed if the surgeon/ anesthesia feels like they are unable to be safely monitored.   Do not shave  48 hours prior to surgery.               Men may shave face and neck.   Do not bring valuables to the hospital. [facility] IS NOT             RESPONSIBLE   FOR VALUABLES.   Contacts, glasses, dentures or bridgework may not be worn into surgery.   Bring small overnight bag day of surgery.   DO NOT BRING YOUR HOME MEDICATIONS TO THE HOSPITAL. PHARMACY WILL DISPENSE MEDICATIONS LISTED ON YOUR MEDICATION LIST TO YOU DURING YOUR ADMISSION IN THE HOSPITAL!    Patients discharged on the day of surgery will not be allowed to drive home.  Someone NEEDS to stay with you for the first 24 hours after anesthesia.   Special Instructions: Bring a copy of your healthcare power of attorney and living will documents the day of surgery if you haven't scanned them before.              Please read over  the following fact sheets you were given: IF YOU HAVE QUESTIONS ABOUT YOUR PRE-OP INSTRUCTIONS PLEASE CALL [phone].   If you received a COVID test during your pre-op visit  it is requested that you wear a mask when out in public, stay away from anyone that may not be feeling well and notify your surgeon if you develop symptoms. If you test positive for Covid or have been in contact with anyone that has tested positive in the last 10 days please notify you surgeon.    [facility] - Preparing for Surgery Before surgery, you can play an important role.  Because skin is not sterile, your skin needs to be as free of germs as possible.  You can reduce the number of germs on your skin by washing with CHG (chlorahexidine gluconate) soap before surgery.  CHG is an antiseptic cleaner which kills germs and bonds with the skin to continue killing germs even after washing. Please DO NOT use if you have an allergy to CHG or antibacterial soaps.  If your skin becomes reddened/irritated stop using the CHG and inform your nurse when you arrive at Short Stay. Do not shave (including legs and underarms) for at least 48 hours prior to  the first CHG shower.  You may shave your face/neck. Please follow these instructions carefully:  1.  Shower with CHG Soap the night before surgery and the  morning of Surgery.  2.  If you choose to wash your hair, wash your hair first as usual with your  normal  shampoo.  3.  After you shampoo, rinse your hair and body thoroughly to remove the  shampoo.                           4.  Use CHG as you would any other liquid soap.  You can apply chg directly  to the skin and wash                       Gently with a scrungie or clean washcloth.  5.  Apply the CHG Soap to your body ONLY FROM THE NECK DOWN.   Do not use on face/ open                           Wound or open sores. Avoid contact with eyes, ears mouth and genitals (private parts).                       Wash face,  Genitals (private  parts) with your normal soap.             6.  Wash thoroughly, paying special attention to the area where your surgery  will be performed.  7.  Thoroughly rinse your body with warm water  from the neck down.  8.  DO NOT shower/wash with your normal soap after using and rinsing off  the CHG Soap.                9.  Pat yourself dry with a clean towel.            10.  Wear clean pajamas.            11.  Place clean sheets on your bed the night of your first shower and do not  sleep with pets. Day of Surgery : Do not apply any lotions/deodorants the morning of surgery.  Please wear clean clothes to the hospital/surgery center.  FAILURE TO FOLLOW THESE INSTRUCTIONS MAY RESULT IN THE CANCELLATION OF YOUR SURGERY PATIENT SIGNATURE_________________________________  NURSE SIGNATURE__________________________________  ________________________________________________________________________

## 2024-04-11 ENCOUNTER — Other Ambulatory Visit: Payer: Self-pay | Admitting: Dermatology

## 2024-04-11 ENCOUNTER — Other Ambulatory Visit: Payer: Self-pay | Admitting: Family Medicine

## 2024-04-11 DIAGNOSIS — I152 Hypertension secondary to endocrine disorders: Secondary | ICD-10-CM

## 2024-04-12 ENCOUNTER — Other Ambulatory Visit: Payer: Self-pay

## 2024-04-12 ENCOUNTER — Other Ambulatory Visit (HOSPITAL_COMMUNITY): Payer: Self-pay

## 2024-04-12 MED ORDER — ESOMEPRAZOLE MAGNESIUM 40 MG PO CPDR: 40.0000 mg | DELAYED_RELEASE_CAPSULE | Freq: Every day | ORAL | 1 refills | Status: AC

## 2024-04-12 NOTE — Telephone Encounter (Signed)
 Received letter from [employer] requesting to do new application pt has switch provider on Ozempic. Pap was done online [facility] [facility] for pt today will follow up in a few days.

## 2024-04-13 ENCOUNTER — Other Ambulatory Visit: Payer: Self-pay | Admitting: Dermatology

## 2024-04-13 ENCOUNTER — Encounter (HOSPITAL_COMMUNITY)
Admission: RE | Admit: 2024-04-13 | Discharge: 2024-04-13 | Disposition: A | Discharge status: Home / Self Care | Source: Ambulatory Visit | Attending: Urology | Admitting: Urology

## 2024-04-13 ENCOUNTER — Telehealth: Payer: Self-pay

## 2024-04-13 ENCOUNTER — Encounter (HOSPITAL_COMMUNITY): Payer: Self-pay

## 2024-04-13 ENCOUNTER — Other Ambulatory Visit: Payer: Self-pay

## 2024-04-13 VITALS — BP 96/61 | HR 103 | Temp 98.2°F | Resp 16 | Ht 61.0 in | Wt 154.0 lb

## 2024-04-13 DIAGNOSIS — Z01812 Encounter for preprocedural laboratory examination: Secondary | ICD-10-CM | POA: Insufficient documentation

## 2024-04-13 DIAGNOSIS — Z01818 Encounter for other preprocedural examination: Secondary | ICD-10-CM

## 2024-04-13 LAB — HEMOGLOBIN A1C
Hgb A1c MFr Bld: 4.6 % — ABNORMAL LOW (ref 4.8–5.6)
Mean Plasma Glucose: 85.32 mg/dL

## 2024-04-13 LAB — CBC
HCT: 32 % — ABNORMAL LOW (ref 36.0–46.0)
Hemoglobin: 10.3 g/dL — ABNORMAL LOW (ref 12.0–15.0)
MCH: 28.7 pg (ref 26.0–34.0)
MCHC: 32.2 g/dL (ref 30.0–36.0)
MCV: 89.1 fL (ref 80.0–100.0)
Platelets: 193 K/uL (ref 150–400)
RBC: 3.59 MIL/uL — ABNORMAL LOW (ref 3.87–5.11)
RDW: 13.4 % (ref 11.5–15.5)
WBC: 6.2 K/uL (ref 4.0–10.5)
nRBC: 0 % (ref 0.0–0.2)

## 2024-04-13 LAB — BASIC METABOLIC PANEL WITH GFR
Anion gap: 13 (ref 5–15)
BUN: 26 mg/dL — ABNORMAL HIGH (ref 8–23)
CO2: 20 mmol/L — ABNORMAL LOW (ref 22–32)
Calcium: 10.2 mg/dL (ref 8.9–10.3)
Chloride: 108 mmol/L (ref 98–111)
Creatinine, Ser: 1.29 mg/dL — ABNORMAL HIGH (ref 0.44–1.00)
GFR, Estimated: 44 mL/min — ABNORMAL LOW (ref >60)
Glucose, Bld: 100 mg/dL — ABNORMAL HIGH (ref 70–99)
Potassium: 4.5 mmol/L (ref 3.5–5.1)
Sodium: 140 mmol/L (ref 135–145)

## 2024-04-13 LAB — GLUCOSE, CAPILLARY: Glucose-Capillary: 96 mg/dL (ref 70–99)

## 2024-04-13 NOTE — Progress Notes (Signed)
   04/13/2024  Patient ID: Joan Vargas, female   DOB: February 20, 1956, 68 y.o.   MRN: [id number]  Contacted [employer] to follow up on status of patient's Ozempic refill. Rep confirmed refill is in process and should arrive at office in 10-14 business days.  Representative confirmed that application submitted on 04/12/24 will not be approved as medicare patients are not eligible for 2026.  Jon VEAR Lindau, PharmD Clinical Pharmacist [phone]

## 2024-04-16 ENCOUNTER — Other Ambulatory Visit: Payer: Self-pay

## 2024-04-16 ENCOUNTER — Other Ambulatory Visit (HOSPITAL_COMMUNITY): Payer: Self-pay

## 2024-04-16 NOTE — H&P (Signed)
 I reviewed the CT scan in detail and I felt that the mild bladder wall thickening was diffuse and similar throughout the bladder. I felt it was hyperemia of the submucosa in keeping with recurrent UTIs. Last culture was positive. Last 2 cultures were positive. Had a lengthy talk last time about bladder erythema and her cystitis and need for biopsies.   The patient still has burning in the urethral area when she voids and sometimes in the suprapubic area. She is on the trimethoprim   No significant prolapse. Modest atrophy   Patient underwent flexible cystoscopy. The majority of the urothelium looked normal. She had increased vascularity. Once again she has reddened areas almost like fingerprints in the posterior wall to the right and left of the midline and closer to the dome. No abnormal findings along the lateral walls of the bladder she did not have any significant bladder pain on bladder filling. Urine was aspirated and sent for culture   Over read Dr. Margot operative note and the findings look very similar. She understands that this can be due to bacterial cystitis or interstitial cystitis that I do not think she has cancer. Dr. Carolee noted that her pain had decreased after he had biopsied and fulgurated the 2 reddened areas and 2 smaller red areas. Biopsies were negative   Patient understands that she could have interstitial cystitis and these could represent Hunner's ulcers. Dr. Carolee did not want a hydrodistention as he was concentrating on safely doing biopsies and fulguration. A gentle dilation and inspection and then biopsies and fulguration was discussed with the patient. Risk of bladder perforation discussed. It may or may not help her pain. Watchful waiting with reasonable expectations was also discussed. Keep her on the trimethoprim because she does get recurrent positive cultures.   Patient and I had a long talk. She really understands the situation and she would rather try to manage the  symptoms and not do a biopsy and fulguration at this stage. She thinks it did help but we do not know for how long. She understands that she could have the interstitial cystitis. She is going to try Azo Standard or Cystex or d-mannose. Reassess in 3 months. Stay on trimethoprim. Gemtesa failed   Today  Frequency stable. Last culture negative  Patient still has the clinching feeling in the suprapubic area. She is on trimethoprim but in my opinion she has not had a UTI.  Urine reviewed and sent for culture  Patient and I discussed the above procedure again.  Patient would like to go ahead with a hydrodistention and/or biopsy and/or fulguration. Increased risk of perforation and sequelae discussed. Again I do not think she has cancer but this will be safest and hopefully we can help some of her discomfort which is significantly impacting her quality of life. She sometimes has a hard time to sit. Remembered our discussions last time very well     ALLERGIES: Aspirin  Sulfa-methoxazole - Itching    MEDICATIONS: Trimethoprim 100 MG Tablet 1 tablet PO Daily  Azo Bladder Control  Cinnamon  Dexcon G7  Estradiol  0.1 MG/GM Cream 1 PO Daily  Fluticasone  Propionate 50 MCG/ACT Suspension 1 ml PO Daily  Gabapentin  Glucosamine  GoodSense Headache PM 25-500 MG Tablet  Levothyroxine  Sodium 150 MCG Tablet 1 tablet PO Daily  metFORMIN  HCl 1000 MG Tablet 1 tablet PO BID  Montelukast  Sodium 10 MG Tablet 1 tablet PO Daily  Multivitamin  Olmesartan Medoxomil-HCTZ 40-12.5 MG Tablet 1 tablet PO Daily  Rosuvastatin  Calcium  5 MG Tablet 1 tablet PO Daily  Systane  tiZANidine HCl 4 MG Capsule  Vitamin D3  Voltaren  Arthritis Pain 1 % Gel     GU PSH: Cystoscopy - 12/12/2023, 09/15/2023, 2023 Cystoscopy TURBT 2-5 cm - 2023 Hysterectomy Locm 300-399Mg /Ml Iodine,1Ml - 11/29/2023 Ovary Removal Partial or Total, Right       PSH Notes: ganglis removal left wrist,    NON-GU PSH: Appendectomy C-Section Hernia  Repair Knee replacement Remove Knee Cartilage, Right Thyroidectomy Tonsillectomy Visit Complexity (formerly GPC1X) - 12/12/2023, 11/15/2023, 09/15/2023     GU PMH: Chronic cystitis (w/o hematuria) - 12/12/2023, - 11/15/2023, - 09/15/2023, - 2023, - 2023 Pelvic/perineal pain - 12/12/2023, - 11/29/2023, - 09/15/2023, - 09/15/2022, - 2024, - 2023, - 2023 Microscopic hematuria - 11/29/2023 Urge incontinence - 11/15/2023, - 2023 Subacute and chronic vulvitis - 09/15/2022, - 2024 Urinary Urgency - 2023 Bladder tumor/neoplasm - 2023 Nocturia - 2023 Urinary Frequency - 2023    NON-GU PMH: Arthritis Diabetes Type 2 Fibromyalgia Hypercholesterolemia Hypertension    FAMILY HISTORY: 2 sons - Son Deceased - Father, Mother   SOCIAL HISTORY: Marital Status: Married Preferred Language: English; Ethnicity: Not Hispanic Or Latino; Race: White Current Smoking Status: Patient does not smoke anymore. Has not smoked since 01/12/1982. Smoked for 5 years. Smoked 1 pack per day.   Tobacco Use Assessment Completed: Used Tobacco in last 30 days? Does not use smokeless tobacco. Drinks 1 drink per month.  Drinks 2 caffeinated drinks per day. Patient's occupation is/was retired.    REVIEW OF SYSTEMS:    GU Review Female:   Patient reports hard to postpone urination, burning /pain with urination, get up at night to urinate, trouble starting your stream, and have to strain to urinate. Patient denies frequent urination, leakage of urine, stream starts and stops, and being pregnant.  Gastrointestinal (Upper):   Patient denies nausea, vomiting, and indigestion/ heartburn.  Gastrointestinal (Lower):   Patient denies diarrhea and constipation.  Constitutional:   Patient denies fever, night sweats, weight loss, and fatigue.  Skin:   Patient denies itching and skin rash/ lesion.  Eyes:   Patient denies blurred vision and double vision.  Ears/ Nose/ Throat:   Patient denies sore throat and sinus problems.  Hematologic/Lymphatic:    Patient denies swollen glands and easy bruising.  Cardiovascular:   Patient denies leg swelling and chest pains.  Respiratory:   Patient denies cough and shortness of breath.  Endocrine:   Patient denies excessive thirst.  Musculoskeletal:   Patient denies back pain and joint pain.  Neurological:   Patient denies headaches and dizziness.  Psychologic:   Patient denies depression and anxiety.   VITAL SIGNS: None   PAST DATA REVIEW: None   PROCEDURES:          Visit Complexity - G2211          Urinalysis w/Scope Dipstick Dipstick Cont'd Micro  Color: Straw Bilirubin: Neg mg/dL WBC/hpf: 10 - 79/yeq  Appearance: Clear Ketones: Neg mg/dL RBC/hpf: 0 - 2/hpf  Specific Gravity: 1.010 Blood: Neg ery/uL Bacteria: Few (10-25/hpf)  pH: 7.5 Protein: Trace mg/dL Cystals: NS (Not Seen)  Glucose: Neg mg/dL Urobilinogen: 0.2 mg/dL Casts: NS (Not Seen)    Nitrites: Neg Trichomonas: Not Present    Leukocyte Esterase: 2+ leu/uL Mucous: Not Present      Epithelial Cells: 0 - 5/hpf      Yeast: NS (Not Seen)      Sperm: Not Present    ASSESSMENT:  ICD-10 Details  1 GU:   Chronic cystitis (w/o hematuria) - N30.20   2   Urinary Frequency - R35.0      PLAN:           Orders Labs Urine Culture          Schedule Return Visit/Planned Activity: Return PRN - Office Visit

## 2024-04-17 ENCOUNTER — Ambulatory Visit (HOSPITAL_COMMUNITY): Payer: Self-pay | Admitting: Physician Assistant

## 2024-04-17 ENCOUNTER — Ambulatory Visit (HOSPITAL_COMMUNITY): Admitting: Anesthesiology

## 2024-04-17 ENCOUNTER — Encounter (HOSPITAL_COMMUNITY)
Admission: RE | Disposition: A | Discharge status: Home / Self Care | Payer: Self-pay | Source: Ambulatory Visit | Attending: Urology

## 2024-04-17 ENCOUNTER — Ambulatory Visit (HOSPITAL_COMMUNITY)
Admission: RE | Admit: 2024-04-17 | Discharge: 2024-04-17 | Disposition: A | Discharge status: Home / Self Care | Source: Ambulatory Visit | Attending: Urology | Admitting: Urology

## 2024-04-17 ENCOUNTER — Encounter (HOSPITAL_COMMUNITY): Payer: Self-pay | Admitting: Urology

## 2024-04-17 DIAGNOSIS — Z87891 Personal history of nicotine dependence: Secondary | ICD-10-CM | POA: Diagnosis not present

## 2024-04-17 DIAGNOSIS — I10 Essential (primary) hypertension: Secondary | ICD-10-CM | POA: Insufficient documentation

## 2024-04-17 DIAGNOSIS — M797 Fibromyalgia: Secondary | ICD-10-CM | POA: Diagnosis not present

## 2024-04-17 DIAGNOSIS — R3989 Other symptoms and signs involving the genitourinary system: Secondary | ICD-10-CM | POA: Diagnosis not present

## 2024-04-17 DIAGNOSIS — R3981 Functional urinary incontinence: Secondary | ICD-10-CM | POA: Diagnosis not present

## 2024-04-17 DIAGNOSIS — N3020 Other chronic cystitis without hematuria: Secondary | ICD-10-CM

## 2024-04-17 DIAGNOSIS — K219 Gastro-esophageal reflux disease without esophagitis: Secondary | ICD-10-CM | POA: Diagnosis not present

## 2024-04-17 DIAGNOSIS — E039 Hypothyroidism, unspecified: Secondary | ICD-10-CM | POA: Diagnosis not present

## 2024-04-17 DIAGNOSIS — E1165 Type 2 diabetes mellitus with hyperglycemia: Secondary | ICD-10-CM | POA: Insufficient documentation

## 2024-04-17 DIAGNOSIS — N734 Female chronic pelvic peritonitis: Secondary | ICD-10-CM | POA: Diagnosis not present

## 2024-04-17 DIAGNOSIS — N3090 Cystitis, unspecified without hematuria: Secondary | ICD-10-CM | POA: Diagnosis not present

## 2024-04-17 DIAGNOSIS — Z01818 Encounter for other preprocedural examination: Secondary | ICD-10-CM

## 2024-04-17 HISTORY — PX: BLADDER INSTILLATION: SHX6893

## 2024-04-17 HISTORY — PX: CYSTOSCOPY WITH BIOPSY: SHX5122

## 2024-04-17 HISTORY — PX: CYSTO WITH HYDRODISTENSION: SHX5453

## 2024-04-17 LAB — GLUCOSE, CAPILLARY
Glucose-Capillary: 107 mg/dL — ABNORMAL HIGH (ref 70–99)
Glucose-Capillary: 93 mg/dL (ref 70–99)
Glucose-Capillary: 133 mg/dL — ABNORMAL HIGH (ref 70–99)

## 2024-04-17 SURGERY — CYSTOSCOPY, WITH BLADDER HYDRODISTENSION
Anesthesia: General | Site: Bladder

## 2024-04-17 MED ORDER — FENTANYL CITRATE (PF) 100 MCG/2ML IJ SOLN
INTRAMUSCULAR | Status: DC | PRN
  Administered 2024-04-17 (×2): 50 ug via INTRAVENOUS

## 2024-04-17 MED ORDER — LACTATED RINGERS IV SOLN: INTRAVENOUS | Status: DC | PRN

## 2024-04-17 MED ORDER — ORAL CARE MOUTH RINSE: 15.0000 mL | Freq: Once | OROMUCOSAL | Status: AC

## 2024-04-17 MED ORDER — FENTANYL CITRATE (PF) 50 MCG/ML IJ SOSY
25.0000 ug | PREFILLED_SYRINGE | INTRAMUSCULAR | Status: DC | PRN
  Administered 2024-04-17 (×3): 50 ug via INTRAVENOUS

## 2024-04-17 MED ORDER — SODIUM CHLORIDE 0.9 % IR SOLN
Status: DC | PRN
  Administered 2024-04-17: 1000 mL

## 2024-04-17 MED ORDER — PHENYLEPHRINE HCL (PRESSORS) 10 MG/ML IV SOLN
INTRAVENOUS | Status: DC | PRN
  Administered 2024-04-17 (×2): 40 ug via INTRAVENOUS

## 2024-04-17 MED ORDER — DROPERIDOL 2.5 MG/ML IJ SOLN: 0.6250 mg | Freq: Once | INTRAMUSCULAR | Status: DC | PRN

## 2024-04-17 MED ORDER — LIDOCAINE HCL (PF) 2 % IJ SOLN
INTRAMUSCULAR | Status: AC
  Filled 2024-04-17: qty 5

## 2024-04-17 MED ORDER — PHENYLEPHRINE 80 MCG/ML (10ML) SYRINGE FOR IV PUSH (FOR BLOOD PRESSURE SUPPORT)
PREFILLED_SYRINGE | INTRAVENOUS | Status: AC
  Filled 2024-04-17: qty 10

## 2024-04-17 MED ORDER — PROPOFOL 10 MG/ML IV BOLUS
INTRAVENOUS | Status: AC
  Filled 2024-04-17: qty 20

## 2024-04-17 MED ORDER — LIDOCAINE HCL (CARDIAC) PF 100 MG/5ML IV SOSY
PREFILLED_SYRINGE | INTRAVENOUS | Status: DC | PRN
  Administered 2024-04-17: 100 mg via INTRATRACHEAL

## 2024-04-17 MED ORDER — STERILE WATER FOR IRRIGATION IR SOLN
Status: DC | PRN
  Administered 2024-04-17 (×2): 3000 mL

## 2024-04-17 MED ORDER — FENTANYL CITRATE (PF) 100 MCG/2ML IJ SOLN
INTRAMUSCULAR | Status: AC
  Filled 2024-04-17: qty 2

## 2024-04-17 MED ORDER — CIPROFLOXACIN IN D5W 400 MG/200ML IV SOLN
INTRAVENOUS | Status: DC | PRN
  Administered 2024-04-17: 400 mg via INTRAVENOUS

## 2024-04-17 MED ORDER — FENTANYL CITRATE (PF) 50 MCG/ML IJ SOSY
PREFILLED_SYRINGE | INTRAMUSCULAR | Status: AC
  Filled 2024-04-17: qty 2

## 2024-04-17 MED ORDER — FENTANYL CITRATE (PF) 50 MCG/ML IJ SOSY
PREFILLED_SYRINGE | INTRAMUSCULAR | Status: AC
  Filled 2024-04-17: qty 1

## 2024-04-17 MED ORDER — LACTATED RINGERS IV SOLN: INTRAVENOUS | Status: DC

## 2024-04-17 MED ORDER — ONDANSETRON HCL 4 MG/2ML IJ SOLN: 4.0000 mg | Freq: Once | INTRAMUSCULAR | Status: DC | PRN

## 2024-04-17 MED ORDER — ONDANSETRON HCL 4 MG/2ML IJ SOLN
INTRAMUSCULAR | Status: AC
  Filled 2024-04-17: qty 2

## 2024-04-17 MED ORDER — OXYCODONE HCL 5 MG/5ML PO SOLN: 5.0000 mg | Freq: Once | ORAL | Status: DC | PRN

## 2024-04-17 MED ORDER — ONDANSETRON HCL 4 MG/2ML IJ SOLN
INTRAMUSCULAR | Status: DC | PRN
  Administered 2024-04-17: 4 mg via INTRAVENOUS

## 2024-04-17 MED ORDER — ACETAMINOPHEN 10 MG/ML IV SOLN: 1000.0000 mg | Freq: Once | INTRAVENOUS | Status: DC | PRN

## 2024-04-17 MED ORDER — CHLORHEXIDINE GLUCONATE 0.12 % MT SOLN
15.0000 mL | Freq: Once | OROMUCOSAL | Status: AC
  Administered 2024-04-17: 15 mL via OROMUCOSAL

## 2024-04-17 MED ORDER — DEXAMETHASONE SOD PHOSPHATE PF 10 MG/ML IJ SOLN
INTRAMUSCULAR | Status: DC | PRN
  Administered 2024-04-17: 10 mg via INTRAVENOUS

## 2024-04-17 MED ORDER — CIPROFLOXACIN IN D5W 400 MG/200ML IV SOLN
400.0000 mg | INTRAVENOUS | Status: DC
  Filled 2024-04-17: qty 200

## 2024-04-17 MED ORDER — INSULIN ASPART 100 UNIT/ML IJ SOLN: 0.0000 [IU] | INTRAMUSCULAR | Status: DC | PRN

## 2024-04-17 MED ORDER — PROPOFOL 10 MG/ML IV BOLUS
INTRAVENOUS | Status: DC | PRN
  Administered 2024-04-17 (×3): 20 mg via INTRAVENOUS
  Administered 2024-04-17: 100 mg via INTRAVENOUS

## 2024-04-17 MED ORDER — OXYCODONE HCL 5 MG PO TABS: 5.0000 mg | ORAL_TABLET | Freq: Once | ORAL | Status: DC | PRN

## 2024-04-17 SURGICAL SUPPLY — 20 items
BAG URINE DRAIN 2000ML AR STRL (UROLOGICAL SUPPLIES) IMPLANT
BAG URO CATCHER STRL LF (MISCELLANEOUS) ×1 IMPLANT
CATH 2WAY 30CC 24FR (CATHETERS) IMPLANT
CATH ROBINSON RED A/P 16FR (CATHETERS) IMPLANT
DRAPE FOOT SWITCH (DRAPES) ×1 IMPLANT
ELECT REM PT RETURN 15FT ADLT (MISCELLANEOUS) ×1 IMPLANT
GLOVE SURG LX STRL 7.5 STRW (GLOVE) ×1 IMPLANT
GOWN STRL REUS W/ TWL XL LVL3 (GOWN DISPOSABLE) ×1 IMPLANT
KIT TURNOVER KIT A (KITS) ×1 IMPLANT
LOOP CUT BIPOLAR 24F LRG (ELECTROSURGICAL) IMPLANT
MANIFOLD NEPTUNE II (INSTRUMENTS) ×1 IMPLANT
NDL SAFETY ECLIPSE 18X1.5 (NEEDLE) IMPLANT
NS IRRIG 1000ML POUR BTL (IV SOLUTION) ×1 IMPLANT
PACK CYSTO (CUSTOM PROCEDURE TRAY) ×1 IMPLANT
PAD TELFA 2X3 NADH STRL (GAUZE/BANDAGES/DRESSINGS) IMPLANT
PENCIL SMOKE EVACUATOR (MISCELLANEOUS) IMPLANT
SYR 30ML LL (SYRINGE) IMPLANT
SYRINGE TOOMEY IRRIG 70ML (MISCELLANEOUS) ×1 IMPLANT
TUBING CONNECTING 10 (TUBING) ×1 IMPLANT
TUBING UROLOGY SET (TUBING) ×1 IMPLANT

## 2024-04-17 NOTE — Discharge Instructions (Signed)
 I have reviewed discharge instructions in detail with the patient. They will follow-up with me or their physician as scheduled. My nurse will also be calling the patients as per protocol.   Hold aspirin  until Saturday.  Patient is already on daily trimethoprim so I did not call in any antibiotics

## 2024-04-17 NOTE — Transfer of Care (Signed)
 Immediate Anesthesia Transfer of Care Note  Patient: Joan Vargas  Procedure(s) Performed: CYSTOSCOPY, WITH BLADDER HYDRODISTENSION (Bladder) CYSTOSCOPY, WITH BIOPSY (Bladder) INSTILLATION, BLADDER (Bladder)  Patient Location: PACU  Anesthesia Type:General  Level of Consciousness: awake, alert , and oriented  Airway & Oxygen Therapy: Patient Spontanous Breathing and Patient connected to face mask oxygen  Post-op Assessment: Report given to RN and Post -op Vital signs reviewed and stable  Post vital signs: Reviewed and stable  Last Vitals:  Vitals Value Taken Time  BP 132/90 04/17/24 17:21  Temp    Pulse 95 04/17/24 17:23  Resp 17 04/17/24 17:23  SpO2 98 % 04/17/24 17:23  Vitals shown include unfiled device data.  Last Pain:  Vitals:   04/17/24 1234  TempSrc:   PainSc: 0-No pain         Complications: No notable events documented.

## 2024-04-17 NOTE — Anesthesia Procedure Notes (Signed)
 Procedure Name: LMA Insertion Date/Time: 04/17/2024 4:33 PM  Performed by: Obadiah Reyes BROCKS, CRNAPre-anesthesia Checklist: Patient identified, Emergency Drugs available, Suction available and Patient being monitored Patient Re-evaluated:Patient Re-evaluated prior to induction Oxygen Delivery Method: Circle System Utilized Preoxygenation: Pre-oxygenation with 100% oxygen Induction Type: IV induction Ventilation: Mask ventilation without difficulty LMA: LMA inserted LMA Size: 4.0 Number of attempts: 1 Airway Equipment and Method: Bite block Placement Confirmation: positive ETCO2 Tube secured with: Tape Dental Injury: Teeth and Oropharynx as per pre-operative assessment

## 2024-04-17 NOTE — Op Note (Signed)
 Preoperative diagnosis: Chronic cystitis and bladder pain Postoperative diagnosis: Chronic cystitis and bladder pain Surgery: Cystoscopy bladder biopsy and fulguration and gentle dilation of bladder Surgeon: Dr. Glendia Kynzlee Hucker  The patient has the above diagnosis and consented to the above procedure.  I used a 3 French scope.  Preoperative antibiotics were given.  Extra care was taken with leg positioning  I initially started cystoscopy and is soon as I looked in the bladder she had diffuse erythematous mucosa and very prominent blood vessels.  The blood vessel started oozing almost immediately.  The first 10 minutes with just gentle filling and emptying of the bladder so I could see.  There was almost a coagulum at the bottom of the bladder posterior to the trigone.  It looks like she may have hyphae or pseudomembranes along the lateral walls as she did in the office.  Initially I was not even going to biopsy for safety reasons.  For the next 20 minutes or so I did gentle fulguration and it was remarkable how many oozing blood vessel she had very easy to fulgurate.  I finally took 2 bladder biopsies in the lower half of the bladder away from the peritoneum and away from the trigone.  I fulgurated those areas though they were not bleeding very much.  I did more gentle irrigation with feeling and emptying the bladder gently.  I did not try to over distend the bladder but her bladder capacity may have been less than 200 mL but is difficult to say  I then put in a 20 French catheter and did a gentle bulb syringe irrigation and the urine was basically clear to pink.  I then had 1 quick look in the bladder and the fulguration sites were at the posterior wall and away from the trigone.  No fulguration your trigone.  Air bubble nicely seen and no bladder perforation.  I was very careful looking at all sites to make certain there is no perforation.  Also with the catheter with hand irrigation was in  keeping with no bladder injury  Unfortunately the patient has chronic cystitis.  I do not think this is carcinoma.  It was almost like she had bad radiation cystitis but she has never had radiation.  I think it safe for her to go home and we will call her in the morning.

## 2024-04-17 NOTE — Anesthesia Postprocedure Evaluation (Signed)
 Anesthesia Post Note  Patient: Joan Vargas  Procedure(s) Performed: CYSTOSCOPY, WITH BLADDER HYDRODISTENSION (Bladder) CYSTOSCOPY, WITH BIOPSY (Bladder) INSTILLATION, BLADDER (Bladder)     Patient location during evaluation: PACU Anesthesia Type: General Level of consciousness: awake and alert Pain management: pain level controlled Vital Signs Assessment: post-procedure vital signs reviewed and stable Respiratory status: spontaneous breathing, nonlabored ventilation, respiratory function stable and patient connected to nasal cannula oxygen Cardiovascular status: blood pressure returned to baseline and stable Postop Assessment: no apparent nausea or vomiting Anesthetic complications: no   No notable events documented.  Last Vitals:  Vitals:   04/17/24 1745 04/17/24 1800  BP: 111/76   Pulse: 77 85  Resp: (!) 32 (!) 22  Temp:    SpO2: 100% 100%    Last Pain:  Vitals:   04/17/24 1800  TempSrc:   PainSc: 9                  [name]

## 2024-04-17 NOTE — Anesthesia Preprocedure Evaluation (Addendum)
 Anesthesia Evaluation  Patient identified by MRN, date of birth, ID band Patient awake    Reviewed: Allergy & Precautions, NPO status , Patient's Chart, lab work & pertinent test results, reviewed documented beta blocker date and time   History of Anesthesia Complications (+) PONV and history of anesthetic complications  Airway Mallampati: II       Dental  (+) Edentulous Upper, Edentulous Lower   Pulmonary former smoker   breath sounds clear to auscultation       Cardiovascular hypertension, Pt. on medications  Rhythm:Regular Rate:Normal     Neuro/Psych Restless Leg Syndrome    GI/Hepatic ,GERD  ,,  Endo/Other  diabetes, Poorly Controlled, Type 2Hypothyroidism    Renal/GU Renal InsufficiencyRenal disease     Musculoskeletal  (+) Arthritis ,  Fibromyalgia -  Abdominal   Peds  Hematology  (+) Blood dyscrasia, anemia Hgb 10.3, Plts 193K (10/31)   Anesthesia Other Findings   Reproductive/Obstetrics                              Anesthesia Physical Anesthesia Plan  ASA: 2  Anesthesia Plan: General   Post-op Pain Management:    Induction: Intravenous  PONV Risk Score and Plan: 2 and Treatment may vary due to age or medical condition, Ondansetron  and Dexamethasone   Airway Management Planned: LMA  Additional Equipment:   Intra-op Plan:   Post-operative Plan: Extubation in OR  Informed Consent:      Dental advisory given  Plan Discussed with: CRNA and Surgeon  Anesthesia Plan Comments:          Anesthesia Quick Evaluation

## 2024-04-17 NOTE — Interval H&P Note (Signed)
 History and Physical Interval Note:  04/17/2024 3:28 PM  Joan Vargas  has presented today for surgery, with the diagnosis of CHRONIC PELVIC PAIN.  The various methods of treatment have been discussed with the patient and family. After consideration of risks, benefits and other options for treatment, the patient has consented to  Procedure(s): CYSTOSCOPY, WITH BLADDER HYDRODISTENSION (N/A) CYSTOSCOPY, WITH BIOPSY (N/A) INSTILLATION, BLADDER (N/A) as a surgical intervention.  The patient's history has been reviewed, patient examined, no change in status, stable for surgery.  I have reviewed the patient's chart and labs.  Questions were answered to the patient's satisfaction.     Kieran Arreguin A Thamara Leger

## 2024-04-18 ENCOUNTER — Encounter (HOSPITAL_COMMUNITY): Payer: Self-pay | Admitting: Urology

## 2024-04-19 LAB — SURGICAL PATHOLOGY

## 2024-04-25 ENCOUNTER — Other Ambulatory Visit: Payer: Self-pay | Admitting: Family Medicine

## 2024-04-25 ENCOUNTER — Ambulatory Visit: Admitting: Family Medicine

## 2024-04-25 ENCOUNTER — Encounter: Payer: Self-pay | Admitting: Family Medicine

## 2024-04-25 ENCOUNTER — Other Ambulatory Visit: Payer: Self-pay

## 2024-04-25 ENCOUNTER — Ambulatory Visit: Payer: Self-pay | Admitting: *Deleted

## 2024-04-25 ENCOUNTER — Telehealth: Payer: Self-pay

## 2024-04-25 VITALS — BP 120/68 | HR 115 | Temp 98.1°F | Ht 61.0 in | Wt 154.0 lb

## 2024-04-25 DIAGNOSIS — B027 Disseminated zoster: Secondary | ICD-10-CM | POA: Diagnosis not present

## 2024-04-25 DIAGNOSIS — L309 Dermatitis, unspecified: Secondary | ICD-10-CM

## 2024-04-25 MED ORDER — GABAPENTIN 300 MG PO CAPS
ORAL_CAPSULE | ORAL | 0 refills | Status: DC
Start: 2024-04-25 — End: 2024-04-25

## 2024-04-25 MED ORDER — VALACYCLOVIR HCL 1 G PO TABS: 1000.0000 mg | ORAL_TABLET | Freq: Three times a day (TID) | ORAL | 0 refills | Status: AC

## 2024-04-25 MED ORDER — GABAPENTIN 300 MG PO CAPS: 300.0000 mg | ORAL_CAPSULE | Freq: Every day | ORAL | 0 refills | Status: DC

## 2024-04-25 NOTE — Progress Notes (Signed)
   Established Patient Office Visit   Subjective:  Patient ID: Joan Vargas, female    DOB: 21-Jan-1956  Age: 68 y.o. MRN: [id number]  Chief Complaint  Patient presents with   Rash    HPI Patient reports she noticed a rash on her left leg with pain. Also, complains of a similar pain on her right buttock,but had not noticed rash until exam. Then, she has another rash with just burning on her right groin that started 2 days ago. She reports she has been managing this at home. \  Review of Systems  Skin:  Positive for rash.  See HPI above     Objective:   BP 120/68   Pulse (!) 115   Temp 98.1 F (36.7 C) (Oral)   Ht 5' 1 (1.549 m)   Wt 154 lb (69.9 kg)   SpO2 99%   BMI 29.10 kg/m    Physical Exam Vitals reviewed. Exam conducted with a chaperone present.  Constitutional:      General: She is not in acute distress.    Appearance: Normal appearance. She is overweight. She is not ill-appearing, toxic-appearing or diaphoretic.  Eyes:     General:        Right eye: No discharge.        Left eye: No discharge.     Conjunctiva/sclera: Conjunctivae normal.  Cardiovascular:     Rate and Rhythm: Tachycardia present.  Pulmonary:     Effort: Pulmonary effort is normal. No respiratory distress.  Musculoskeletal:        General: Normal range of motion.  Skin:    General: Skin is warm and dry.     Findings: Rash (See pictures of right buttocks, right leg, and right groin.) present.  Neurological:     General: No focal deficit present.     Mental Status: She is alert and oriented to person, place, and time. Mental status is at baseline.  Psychiatric:        Mood and Affect: Mood normal.        Behavior: Behavior normal.        Thought Content: Thought content normal.        Judgment: Judgment normal.            Assessment & Plan:  Disseminated herpes zoster -     valACYclovir HCl; Take 1 tablet (1,000 mg total) by mouth 3 (three) times daily for 7 days.  Dispense: 21  tablet; Refill: 0 -     Gabapentin; Take 1 capsule (300 mg total) by mouth daily.  Dispense: 30 capsule; Refill: 0  Dermatitis  -Rash with pain on right leg and right buttocks is shingles. Provided information about shingles.  -Prescribed Valacyclovir 1,000mg  tablet every 8 hours for 7 days.  -Continue taking your nightly dose of Gabapentin 600mg  to help with pain. Prescribed Gabapentin 300mg  tablet to take during the day for nerve pain associated with shingles.  -As for the groin rash, it appears to be from skin irritation. Recommend to cleanse area with a mild soap, pat dry, and keep dry. -Follow as scheduled on 11/21 for chronic management.    Jamaree Hosier, NP

## 2024-04-25 NOTE — Patient Instructions (Addendum)
-  It was good to see you today.  -Rash with pain on right leg and right buttocks is shingles. Provided information about shingles.  -Prescribed Valacyclovir 1,000mg  tablet every 8 hours for 7 days.  -Continue taking your nightly dose of Gabapentin 600mg  to help with pain. Prescribed Gabapentin 300mg  tablet to take during the day for nerve pain associated with shingles.  -As for the groin rash, it appears to be from skin irritation. Recommend to cleanse area with a mild soap, pat dry, and keep dry. -Follow as scheduled on 11/21 for chronic management.

## 2024-04-25 NOTE — Telephone Encounter (Unsigned)
 Copied from CRM [id number]. Topic: Clinical - Medication Question >> Apr 25, 2024 12:30 PM Drema MATSU wrote: Reason for CRM: Donnie from [facility] states that pt gabepentin has different strengths and he wants the instructions modified. He needs a prescriptions that states to take one in the morning and an additional 600 mg in the evening. Pt is getting medication from someone else as well.

## 2024-04-25 NOTE — Telephone Encounter (Signed)
 FYI Only or Action Required?: FYI only for provider: appointment scheduled on 11/12.  Patient was last seen in primary care on 04/03/2024 by Joan Philippe SAUNDERS, NP.  Called Nurse Triage reporting Rash.  Symptoms began several days ago.  Interventions attempted: OTC medications: hydrocortisone.  Symptoms are: gradually worsening.  Triage Disposition: See HCP Within 4 Hours (Or PCP Triage)  Patient/caregiver understands and will follow disposition?: yes    Red Word that prompted transfer to Nurse Triage:  broke out rash on right leg, two different rashes, hurts so bad she can barley walk. States part of it had puss come out. Reason for Disposition  Large or small blisters on skin (i.e., fluid filled bubbles or sacs)  Answer Assessment - Initial Assessment Questions 1. APPEARANCE of RASH: What does the rash look like? (e.g., blisters, dry flaky skin, red spots, redness, sores)     Red, few small dots, 2 inches with another spot on calve 1/2 inch 2. SIZE: How big are the spots? (e.g., tip of pen, eraser, coin; inches, centimeters)     Pin size 3. LOCATION: Where is the rash located?     R leg- painful-back of knee 4. COLOR: What color is the rash? (Note: It is difficult to assess rash color in people with darker-colored skin. When this situation occurs, simply ask the caller to describe what they see.)     red 5. ONSET: When did the rash begin?     Friday/Saturday 6. FEVER: Do you have a fever? If Yes, ask: What is your temperature, how was it measured, and when did it start?     unsure 7. ITCHING: Does the rash itch? If Yes, ask: How bad is the itch? (Scale 1-10; or mild, moderate, severe)     No- burning 8. CAUSE: What do you think is causing the rash?     Unsure 9. MEDICINE FACTORS: Have you started any new medicines within the last 2 weeks? (e.g., antibiotics)      Recent surgery, antibiotic use 10. OTHER SYMPTOMS: Do you have any other symptoms?  (e.g., dizziness, headache, sore throat, joint pain)       Another groin- R side- looks different  Protocols used: Rash or Redness - [facility]

## 2024-04-25 NOTE — Telephone Encounter (Signed)
 Noted see pt today with pcp

## 2024-04-25 NOTE — Telephone Encounter (Signed)
 Reordered medication for one at bedtime and one in the morning.

## 2024-04-30 ENCOUNTER — Other Ambulatory Visit: Payer: Self-pay | Admitting: Family Medicine

## 2024-04-30 ENCOUNTER — Telehealth: Payer: Self-pay

## 2024-04-30 NOTE — Telephone Encounter (Unsigned)
 Copied from CRM #[id number]. Topic: Clinical - Medication Refill >> Apr 30, 2024 12:09 PM Tanazia G wrote: Medication: levothyroxine  (SYNTHROID ) 125 MCG tablet  Has the patient contacted their pharmacy? Yes (Agent: If no, request that the patient contact the pharmacy for the refill. If patient does not wish to contact the pharmacy document the reason why and proceed with request.) (Agent: If yes, when and what did the pharmacy advise?)  This is the patient's preferred pharmacy:  [facility] [address] [place] - [address]  [place] [address] Phone: [phone] Fax: [phone]  Is this the correct pharmacy for this prescription? Yes If no, delete pharmacy and type the correct one.   Has the prescription been filled recently? Yes  Is the patient out of the medication? Yes  Has the patient been seen for an appointment in the last year OR does the patient have an upcoming appointment? Yes  Can we respond through MyChart? Yes  Agent: Please be advised that Rx refills may take up to 3 business days. We ask that you follow-up with your pharmacy.

## 2024-04-30 NOTE — Telephone Encounter (Signed)
 Copied from CRM #[id number]. Topic: Clinical - Medical Advice >> Apr 30, 2024 12:11 PM Mercedes MATSU wrote: Reason for CRM: Patient wanted to know how long should she stay away from people and how long shingles last. Requesting call back from nurse and can be reached at [phone].

## 2024-05-01 DIAGNOSIS — R102 Pelvic and perineal pain unspecified side: Secondary | ICD-10-CM | POA: Diagnosis not present

## 2024-05-01 MED ORDER — LEVOTHYROXINE SODIUM 125 MCG PO TABS: 125.0000 ug | ORAL_TABLET | Freq: Every day | ORAL | 1 refills | Status: AC

## 2024-05-01 NOTE — Telephone Encounter (Signed)
 Pt.notified

## 2024-05-04 ENCOUNTER — Encounter: Payer: Self-pay | Admitting: Family Medicine

## 2024-05-04 ENCOUNTER — Ambulatory Visit: Admitting: Family Medicine

## 2024-05-04 VITALS — BP 100/64 | HR 95 | Temp 98.0°F | Ht 61.0 in | Wt 154.0 lb

## 2024-05-04 DIAGNOSIS — G2581 Restless legs syndrome: Secondary | ICD-10-CM

## 2024-05-04 DIAGNOSIS — I152 Hypertension secondary to endocrine disorders: Secondary | ICD-10-CM

## 2024-05-04 DIAGNOSIS — E1159 Type 2 diabetes mellitus with other circulatory complications: Secondary | ICD-10-CM | POA: Diagnosis not present

## 2024-05-04 MED ORDER — METOPROLOL SUCCINATE ER 25 MG PO TB24: 25.0000 mg | ORAL_TABLET | Freq: Every day | ORAL | 1 refills | Status: AC

## 2024-05-04 MED ORDER — OLMESARTAN MEDOXOMIL 20 MG PO TABS: 20.0000 mg | ORAL_TABLET | Freq: Every day | ORAL | 1 refills | Status: DC

## 2024-05-04 MED ORDER — GABAPENTIN 600 MG PO TABS: 600.0000 mg | ORAL_TABLET | Freq: Every day | ORAL | 1 refills | Status: AC

## 2024-05-04 NOTE — Patient Instructions (Signed)
-  It was great to see you today.  -Refilled Gabapentin  600mg  at bedtime for restless leg.  -Blood pressure is still on the lower normal side. Recommend to continue Metoprolol  ER 25mg  tablet daily to help with blood pressure and help with heart rate. Sent in new prescription. Decrease Olmesartan  to 20mg  daily. Recommend to monitor blood pressure twice a day and bring in readings to your next appointment. Also, take blood pressure reading to cardiology appointment for them to be reviewed.  -Follow up in 1 month.

## 2024-05-04 NOTE — Progress Notes (Signed)
 Established Patient Office Visit   Subjective:  Patient ID: Joan Vargas, female    DOB: Mar 05, 1956  Age: 68 y.o. MRN: [id number]  Chief Complaint  Patient presents with   Medical Management of Chronic Issues    1 month follow up     HPI HTN: Chronic. On previous appointment, blood pressures were improving with less dizziness and lightheaded. Through shared decision making decided to continue with Olmesartan  40mg  daily, but add back Metoprolol  12.5mg  (1/2 25mg  tablet) for heart rate control since heart rate is increasing again.   Today, she reports dizziness and lightheadedness, a little bit. Not that often, possibly twice a week. Patient has been taking Olmesartan  40mg  daily, but has been taking Metoprolol  25mg  (1/2 50mg  tablet) instead of 12.5mg  (1/2 tablet of 25mg  tablet). Blood pressure have been ranging 83-114/51-68, heart rate ranging 85-115. She has an appointment scheduled with cardiology on 11/24 with Dr. Barbaraann at [facility].   She is requesting a refill of Gabapentin  600mg  at bedtime for restless leg.  ROS See HPI above     Objective:   BP 100/64   Pulse 95   Temp 98 F (36.7 C) (Oral)   Ht 5' 1 (1.549 m)   Wt 154 lb (69.9 kg)   SpO2 96%   BMI 29.10 kg/m    Physical Exam Vitals reviewed.  Constitutional:      General: She is not in acute distress.    Appearance: Normal appearance. She is overweight. She is not ill-appearing, toxic-appearing or diaphoretic.  HENT:     Head: Normocephalic and atraumatic.  Eyes:     General:        Right eye: No discharge.        Left eye: No discharge.     Conjunctiva/sclera: Conjunctivae normal.  Cardiovascular:     Rate and Rhythm: Normal rate and regular rhythm.     Heart sounds: Normal heart sounds. No murmur heard.    No friction rub. No gallop.  Pulmonary:     Effort: Pulmonary effort is normal. No respiratory distress.     Breath sounds: Normal breath sounds.  Musculoskeletal:        General: Normal range of  motion.  Skin:    General: Skin is warm and dry.  Neurological:     General: No focal deficit present.     Mental Status: She is alert and oriented to person, place, and time. Mental status is at baseline.  Psychiatric:        Mood and Affect: Mood normal.        Behavior: Behavior normal.        Thought Content: Thought content normal.        Judgment: Judgment normal.      Assessment & Plan:  Restless leg -     Gabapentin ; Take 1 tablet (600 mg total) by mouth at bedtime.  Dispense: 90 tablet; Refill: 1  Hypertension associated with diabetes (HCC) -     Metoprolol  Succinate ER; Take 1 tablet (25 mg total) by mouth daily.  Dispense: 90 tablet; Refill: 1 -     Olmesartan  Medoxomil; Take 1 tablet (20 mg total) by mouth daily.  Dispense: 90 tablet; Refill: 1  -Refilled Gabapentin  600mg  at bedtime for restless leg.  -Blood pressure is still on the lower normal side. Recommend to continue Metoprolol  ER 25mg  tablet daily to help with blood pressure and help with heart rate. Sent in new prescription. Decrease Olmesartan  to 20mg  daily. Recommend  to monitor blood pressure twice a day and bring in readings to your next appointment. Also, take blood pressure reading to cardiology appointment for them to be reviewed.   Return in about 1 month (around 06/03/2024).   Sonny Poth, NP

## 2024-05-06 NOTE — Progress Notes (Unsigned)
 Cardiology Office Note:  .   Date:  05/07/2024  ID:  Joan Vargas, DOB 1955-09-03, MRN [id number] PCP: Billy Philippe SAUNDERS, NP  [facility] Providers Cardiologist:  None   History of Present Illness: .    Chief Complaint  Patient presents with   Dizziness    Joan Vargas is a 68 y.o. female with history of DM, HTN, HLD who presents for the evaluation of dizziness at the request of Billy Philippe SAUNDERS, NP.  History of Present Illness   Joan Vargas is a 68 year old female with hypertension and CKD 3A who presents with dizziness.  She experiences dizziness, particularly when changing positions such as bending over and standing up, described as a sensation of the room spinning. No episodes of syncope, chest pain, or dyspnea. Her blood pressure was recorded as 90/60 mmHg during the visit. She is currently taking olmesartan  20 mg daily and metoprolol  succinate 25 mg daily for blood pressure management.  She has a history of hypertension and recently her blood pressure medication dosage was changed. She reports that her blood pressure has been low since her medication was changed, and she is currently taking olmesartan  20 mg daily.  Her past medical history includes type 2 diabetes, with a recent A1c of 4.6. She is on Ozempic, metformin , and Jardiance  10 mg daily for diabetes management. She also has a history of restless legs syndrome and fibromyalgia.  She recently had shingles on her right leg, which has left her leg feeling numb, although the rash has dried up. She has been staying in bed due to the discomfort.  In terms of social history, she is retired, having worked as a publishing copy. She quit smoking in her twenties and drinks alcohol  occasionally. She is married with two sons and three grandsons.  No history of myocardial infarction or cerebrovascular accident. No family history of heart disease.          Problem List HTN DM -A1c 4.6  HLD -T chol  129, HDL 48, LDL 54, TG 159 CKD 3a     ROS: All other ROS reviewed and negative. Pertinent positives noted in the HPI.     Studies Reviewed: SABRA       EKG 02/04/2024 NSR 74 bpm, no acute changes  Physical Exam:   VS:  BP (!) 90/52   Pulse 99   Ht 5' 1 (1.549 m)   Wt 153 lb 12.8 oz (69.8 kg)   PF 98 L/min   BMI 29.06 kg/m    Wt Readings from Last 3 Encounters:  05/07/24 153 lb 12.8 oz (69.8 kg)  05/04/24 154 lb (69.9 kg)  04/25/24 154 lb (69.9 kg)    GEN: Well nourished, well developed in no acute distress NECK: No JVD; No carotid bruits CARDIAC: RRR, no murmurs, rubs, gallops RESPIRATORY:  Clear to auscultation without rales, wheezing or rhonchi  ABDOMEN: Soft, non-tender, non-distended EXTREMITIES:  No edema; No deformity  ASSESSMENT AND PLAN: .   Assessment and Plan    Dizziness due to orthostatic hypotension, likely medication-induced Dizziness likely from orthostatic hypotension due to medication-induced low blood pressure. Blood pressure 90/52 mmHg. Normal EKG and cardiovascular exam. Possible contribution from anemia. - Stopped olmesartan  immediately. - Continue metoprolol  succinate 25 mg daily, consider discontinuation if symptoms persist. - Encouraged increased fluid intake to 60-80 ounces daily. - Scheduled follow-up with PA in four weeks for re-evaluation.  Hypertension, currently overtreated Hypertension overtreated, causing low blood  pressure and dizziness. Medications include olmesartan  and metoprolol  succinate. - Stopped olmesartan  immediately. - Continue metoprolol  succinate 25 mg daily, consider discontinuation if symptoms persist.  Anemia, unspecified etiology Anemia with hemoglobin at 10.3 g/dL. Etiology unclear. May contribute to dizziness and low blood pressure. - Follow up with primary care physician to evaluate and manage anemia.  Type 2 diabetes mellitus, well controlled Type 2 diabetes well controlled with A1c of 4.6%. - Referred to primary care  physician for ongoing diabetes management. - I am not sure she needs to be on metformin /jardiance /ozempic. I will defer to PCP.              Follow-up: Return in about 4 weeks (around 06/04/2024).  Signed, Darryle DASEN. Barbaraann, MD, [facility]  [facility]  [address] [place], [place] [address] [phone]  10:59 AM

## 2024-05-07 ENCOUNTER — Ambulatory Visit (HOSPITAL_BASED_OUTPATIENT_CLINIC_OR_DEPARTMENT_OTHER): Admitting: Cardiovascular Disease

## 2024-05-07 ENCOUNTER — Encounter (HOSPITAL_BASED_OUTPATIENT_CLINIC_OR_DEPARTMENT_OTHER): Payer: Self-pay | Admitting: Cardiovascular Disease

## 2024-05-07 VITALS — BP 90/52 | HR 99 | Ht 61.0 in | Wt 153.8 lb

## 2024-05-07 DIAGNOSIS — D509 Iron deficiency anemia, unspecified: Secondary | ICD-10-CM | POA: Diagnosis not present

## 2024-05-07 DIAGNOSIS — E782 Mixed hyperlipidemia: Secondary | ICD-10-CM

## 2024-05-07 DIAGNOSIS — R42 Dizziness and giddiness: Secondary | ICD-10-CM | POA: Diagnosis not present

## 2024-05-07 DIAGNOSIS — I951 Orthostatic hypotension: Secondary | ICD-10-CM

## 2024-05-07 NOTE — Patient Instructions (Signed)
 Medication Instructions:  STOP OLMESARTAN    *If you need a refill on your cardiac medications before your next appointment, please call your pharmacy*  Lab Work: NONE If you have labs (blood work) drawn today and your tests are completely normal, you will receive your results only by: MyChart Message (if you have MyChart) OR A paper copy in the mail If you have any lab test that is abnormal or we need to change your treatment, we will call you to review the results.  Testing/Procedures: NONE  Follow-Up: At [facility], you and your health needs are our priority.  As part of our continuing mission to provide you with exceptional heart care, our providers are all part of one team.  This team includes your primary Cardiologist (physician) and Advanced Practice Providers or APPs (Physician Assistants and Nurse Practitioners) who all work together to provide you with the care you need, when you need it.  Your next appointment:   4 week(s) FOR BLOOD PRESSURE   Provider:   One of our Advanced Practice Providers (APPs): Morse Clause, PA-C  Lamarr Satterfield, NP Miriam Shams, NP  Olivia Pavy, PA-C Josefa Beauvais, NP  Leontine Salen, PA-C Orren Fabry, PA-C  Bruin, PA-C Ernest Dick, NP  Damien Braver, NP Jon Hails, PA-C  Waddell Donath, PA-C    Dayna Dunn, PA-C  Scott Weaver, PA-C Lum Louis, NP Katlyn West, NP Callie Goodrich, PA-C  Xika Zhao, NP Thom Sluder, PA-C    Kathleen Johnson, PA-C   FOLLOW UP WITH DR O'NEAL AS NEEDED   FOLLOW UP WITH PRIMARY CARE REGARDING YOUR ANEMIA

## 2024-05-15 ENCOUNTER — Other Ambulatory Visit: Payer: Self-pay | Admitting: Dermatology

## 2024-05-15 ENCOUNTER — Other Ambulatory Visit: Payer: Self-pay | Admitting: Family Medicine

## 2024-05-15 ENCOUNTER — Encounter: Payer: Self-pay | Admitting: Dermatology

## 2024-05-15 ENCOUNTER — Ambulatory Visit: Admitting: Dermatology

## 2024-05-15 DIAGNOSIS — L821 Other seborrheic keratosis: Secondary | ICD-10-CM

## 2024-05-15 DIAGNOSIS — L299 Pruritus, unspecified: Secondary | ICD-10-CM

## 2024-05-15 DIAGNOSIS — B029 Zoster without complications: Secondary | ICD-10-CM

## 2024-05-15 DIAGNOSIS — Z86007 Personal history of in-situ neoplasm of skin: Secondary | ICD-10-CM

## 2024-05-15 DIAGNOSIS — B0229 Other postherpetic nervous system involvement: Secondary | ICD-10-CM | POA: Diagnosis not present

## 2024-05-15 DIAGNOSIS — E0822 Diabetes mellitus due to underlying condition with diabetic chronic kidney disease: Secondary | ICD-10-CM

## 2024-05-15 DIAGNOSIS — E119 Type 2 diabetes mellitus without complications: Secondary | ICD-10-CM

## 2024-05-15 DIAGNOSIS — C4499 Other specified malignant neoplasm of skin, unspecified: Secondary | ICD-10-CM

## 2024-05-15 MED ORDER — CLOBETASOL PROPIONATE 0.05 % EX OINT: 1.0000 | TOPICAL_OINTMENT | Freq: Two times a day (BID) | CUTANEOUS | 2 refills | Status: DC

## 2024-05-15 NOTE — Progress Notes (Signed)
   Follow-Up Visit   Subjective  Joan Vargas is a 68 y.o. female established patient who presents for FOLLOW UP on the diagnoses listed below:  Patient was last evaluated on 09/08/23.   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 She stated she had great results with the sample of Strata MGT with her itch rated a 3/10 until she ran out then she resumed applying tacrolimus . She has also continued using the estradiol  cream.    The following portions of the chart were reviewed this encounter and updated as appropriate: medications, allergies, medical history  Review of Systems:  No other skin or systemic complaints except as noted in HPI or Assessment and Plan.  Objective  Well appearing patient in no apparent distress; mood and affect are within normal limits.   A focused examination was performed of the following areas: vagina    Relevant exam findings are noted in the Assessment and Plan.    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 Current symptoms include dryness and itchiness, rated 3/10 in severity. Symptoms are well-managed with Strata MGT, which provides some relief. No new topicals are being used due to concurrent shingles treatment. - Continue Strata MGT for vulvar symptoms. - Use tacrolimus  as a backup until Strata MGT is available. - Ensure follow-up with urologist to monitor for bladder cancer.  Herpes zoster with neuralgia Herpes zoster with neuralgia, primarily affecting the dermatome along the leg and buttock. Pain is significant and persistent, likely due to nerve involvement. Previous treatment with Valtrex  for one week provided limited relief. Gabapentin  is being used for pain management. Shingles pain may persist for 6-8  weeks. - Continue gabapentin  300 mg in the morning and 600 mg at night. - Use clobetasol  ointment on scabs and affected areas. - Consider ibuprofen for additional pain relief if needed. - Use extra strength Tylenol  for pain management.  Seborrheic keratosis Presence of seborrheic keratosis, identified as an age spot or growth. It is benign and not dangerous.   No follow-ups on file.   Documentation: I have reviewed the above documentation for accuracy and completeness, and I agree with the above.  I, Shirron Maranda, CMA II, am acting as scribe for:  Delon Lenis, DO

## 2024-05-15 NOTE — Patient Instructions (Signed)

## 2024-05-16 ENCOUNTER — Other Ambulatory Visit: Payer: Self-pay

## 2024-05-16 DIAGNOSIS — D649 Anemia, unspecified: Secondary | ICD-10-CM

## 2024-05-18 ENCOUNTER — Ambulatory Visit: Admitting: Family Medicine

## 2024-05-18 ENCOUNTER — Other Ambulatory Visit

## 2024-05-18 DIAGNOSIS — D649 Anemia, unspecified: Secondary | ICD-10-CM

## 2024-05-19 LAB — IRON,TIBC AND FERRITIN PANEL
%SAT: 21 % (ref 16–45)
Ferritin: 442 ng/mL — ABNORMAL HIGH (ref 16–288)
Iron: 52 ug/dL (ref 45–160)
TIBC: 253 ug/dL (ref 250–450)

## 2024-05-21 ENCOUNTER — Ambulatory Visit: Payer: Self-pay | Admitting: Family Medicine

## 2024-05-22 DIAGNOSIS — H524 Presbyopia: Secondary | ICD-10-CM | POA: Diagnosis not present

## 2024-05-22 LAB — OPHTHALMOLOGY REPORT-SCANNED

## 2024-05-27 ENCOUNTER — Other Ambulatory Visit: Payer: Self-pay | Admitting: Family Medicine

## 2024-05-27 DIAGNOSIS — B027 Disseminated zoster: Secondary | ICD-10-CM

## 2024-06-01 ENCOUNTER — Ambulatory Visit (HOSPITAL_BASED_OUTPATIENT_CLINIC_OR_DEPARTMENT_OTHER): Admitting: Cardiology

## 2024-06-04 ENCOUNTER — Ambulatory Visit: Admitting: Family Medicine

## 2024-06-04 ENCOUNTER — Encounter: Payer: Self-pay | Admitting: Family Medicine

## 2024-06-04 VITALS — BP 120/78 | HR 80 | Temp 97.9°F | Ht 61.0 in | Wt 158.0 lb

## 2024-06-04 DIAGNOSIS — D649 Anemia, unspecified: Secondary | ICD-10-CM | POA: Diagnosis not present

## 2024-06-04 DIAGNOSIS — I959 Hypotension, unspecified: Secondary | ICD-10-CM

## 2024-06-04 NOTE — Progress Notes (Unsigned)
 A  Established Patient Office Visit   Subjective:  Patient ID: Joan Vargas, female    DOB: December 16, 1955  Age: 68 y.o. MRN: [id number]  Chief Complaint  Patient presents with   Medical Management of Chronic Issues    1 month follow up     HPI HTN: On previous appointment, 05/04/2024, decreased Olmesartan  to 20mg  daily while continuing Metoprolol  ER 25mg  tablet daily since patient was having some dizziness and low BP readings. She had an appointment with [facility] at [address]-Dr. Darryle Decent on 11/24. He recommended to stop taking Olmesartan  and continue with Metoprolol  XR. He thought the dizziness was from orthostatic hypotension, likely medication induced. Also, thought anemia could be contributing. Patient had an iron panel collected on 12/05 with a normal iron level of 52 and ferritin level 442. Also, concerned about patient being on Metformin /Jardiance /Ozempic for A1c of 4.6%. However, patient is only taking Metformin  and Ozempic. Stopped Jardiance  due to having bladder pain/pressure that she is being treated by [facility].   Today, she reports she is still having some dizziness, but seems more improved with stopping Olmesartan . Only taking Metoprolol  XL 25mg  daily. Patient has been monitoring her blood pressures at home. Ranging [phone]. She has a follow up with cardiology on 06/22/2024.   ROS See HPI above     Objective:   BP 120/78   Pulse 80   Temp 97.9 F (36.6 C) (Oral)   Ht 5' 1 (1.549 m)   Wt 158 lb (71.7 kg)   SpO2 99%   BMI 29.85 kg/m  BP Readings from Last 3 Encounters:  06/04/24 120/78  05/07/24 (!) 90/52  05/04/24 100/64      Physical Exam Vitals reviewed.  Constitutional:      General: She is not in acute distress.    Appearance: Normal appearance. She is not ill-appearing, toxic-appearing or diaphoretic.  HENT:     Head: Normocephalic and atraumatic.  Eyes:     General:        Right eye: No discharge.        Left eye:  No discharge.     Conjunctiva/sclera: Conjunctivae normal.  Cardiovascular:     Rate and Rhythm: Normal rate.  Pulmonary:     Effort: Pulmonary effort is normal. No respiratory distress.  Musculoskeletal:        General: Normal range of motion.  Skin:    General: Skin is warm and dry.  Neurological:     General: No focal deficit present.     Mental Status: She is alert and oriented to person, place, and time. Mental status is at baseline.  Psychiatric:        Mood and Affect: Mood normal.        Behavior: Behavior normal.        Thought Content: Thought content normal.        Judgment: Judgment normal.      Assessment & Plan:  Anemia, unspecified type  Hypotension, unspecified hypotension type  -Blood pressures are stable with less dizziness. Continue with only taking Metoprolol  ER 25mg  tablet daily. Follow up with cardiology as scheduled. -Discussed about labs pertaining to anemia. Discussed about a possible referral to hematology for a further work up on normal iron levels with elevated ferritin levels, but would like to hold on this referral. -Follow up in in 2 months for chronic management. Advised to please be fasting for lab collection and will need a urine sample at this visit.  Return in about 2 months (around 08/05/2024) for fasting and needs urine .   Nyajah Hyson, NP

## 2024-06-05 NOTE — Patient Instructions (Addendum)
-  It was great to see you and I hope you have wonderful Christmas. -Blood pressures are stable with less dizziness. Continue with only taking Metoprolol  ER 25mg  tablet daily. Follow up with cardiology as scheduled. -Discussed about labs pertaining to anemia. Discussed about a possible referral to hematology for a further work up on normal iron levels with elevated ferritin levels, but would like to hold on this referral. -Follow up in in 2 months for chronic management. Please be fasting for lab collection and will need a urine sample at this visit.

## 2024-06-13 ENCOUNTER — Other Ambulatory Visit: Payer: Self-pay | Admitting: Family Medicine

## 2024-06-13 NOTE — Telephone Encounter (Unsigned)
 Copied from CRM #[id number]. Topic: Clinical - Medication Refill >> Jun 13, 2024  4:06 PM Thersia C wrote: Medication: montelukast  (SINGULAIR ) 10 MG tablet   Has the patient contacted their pharmacy? Yes (Agent: If no, request that the patient contact the pharmacy for the refill. If patient does not wish to contact the pharmacy document the reason why and proceed with request.) (Agent: If yes, when and what did the pharmacy advise?)  This is the patient's preferred pharmacy:  [facility] [address] [place] - [address] [place] [address] Phone: [phone] Fax: [phone]  Is this the correct pharmacy for this prescription? Yes If no, delete pharmacy and type the correct one.   Has the prescription been filled recently? No  Is the patient out of the medication? Yes  Has the patient been seen for an appointment in the last year OR does the patient have an upcoming appointment? Yes  Can we respond through MyChart? Yes  Agent: Please be advised that Rx refills may take up to 3 business days. We ask that you follow-up with your pharmacy.

## 2024-06-15 MED ORDER — MONTELUKAST SODIUM 10 MG PO TABS: 10.0000 mg | ORAL_TABLET | Freq: Every day | ORAL | 0 refills | Status: AC

## 2024-06-19 DIAGNOSIS — Z1231 Encounter for screening mammogram for malignant neoplasm of breast: Secondary | ICD-10-CM

## 2024-06-22 ENCOUNTER — Ambulatory Visit: Admitting: Physician Assistant

## 2024-06-22 NOTE — Progress Notes (Unsigned)
" °  Cardiology Office Note   Date:  06/22/2024  ID:  ADRIE PICKING, DOB 26-Jun-1955, MRN [id number] PCP: Billy Philippe SAUNDERS, NP  [facility] Providers Cardiologist:  None   History of Present Illness AMBERLYN MARTINEZGARCIA is a 69 y.o. female with history of DM, HTN, HLD who presents for the evaluation of dizziness at the request of Billy Philippe SAUNDERS, NP.   History of Present Illness   NEKO MCGEEHAN is a 69 year old female with hypertension and CKD 3A who presents with dizziness.   She experiences dizziness, particularly when changing positions such as bending over and standing up, described as a sensation of the room spinning. No episodes of syncope, chest pain, or dyspnea. Her blood pressure was recorded as 90/60 mmHg during the visit. She is currently taking olmesartan  20 mg daily and metoprolol  succinate 25 mg daily for blood pressure management.   She has a history of hypertension and recently her blood pressure medication dosage was changed. She reports that her blood pressure has been low since her medication was changed, and she is currently taking olmesartan  20 mg daily.   Her past medical history includes type 2 diabetes, with a recent A1c of 4.6. She is on Ozempic, metformin , and Jardiance  10 mg daily for diabetes management. She also has a history of restless legs syndrome and fibromyalgia.   She recently had shingles on her right leg, which has left her leg feeling numb, although the rash has dried up. She has been staying in bed due to the discomfort.   In terms of social history, she is retired, having worked as a publishing copy. She quit smoking in her twenties and drinks alcohol  occasionally. She is married with two sons and three grandsons.   No history of myocardial infarction or cerebrovascular accident. No family history of heart disease.     Today, she ***   ROS: pertinent ROS in HPI  Studies Reviewed      *** Risk Assessment/Calculations {Does this  patient have ATRIAL FIBRILLATION?:[id number]} No BP recorded.  {Refresh Note OR Click here to enter BP  :1}***       Physical Exam VS:  There were no vitals taken for this visit.       Wt Readings from Last 3 Encounters:  06/04/24 158 lb (71.7 kg)  05/07/24 153 lb 12.8 oz (69.8 kg)  05/04/24 154 lb (69.9 kg)    GEN: Well nourished, well developed in no acute distress NECK: No JVD; No carotid bruits CARDIAC: ***RRR, no murmurs, rubs, gallops RESPIRATORY:  Clear to auscultation without rales, wheezing or rhonchi  ABDOMEN: Soft, non-tender, non-distended EXTREMITIES:  No edema; No deformity   ASSESSMENT AND PLAN  Dizziness/orthopedic hypotension HTN Anemia     {Are you ordering a CV Procedure (e.g. stress test, cath, DCCV, TEE, etc)?   Press F2        :[id number]}  Dispo: ***  Signed, Orren LOISE Fabry, PA-C   "

## 2024-06-25 ENCOUNTER — Encounter: Payer: Self-pay | Admitting: Pediatrics

## 2024-06-25 ENCOUNTER — Ambulatory Visit: Attending: Physician Assistant | Admitting: Physician Assistant

## 2024-06-25 ENCOUNTER — Encounter: Payer: Self-pay | Admitting: Physician Assistant

## 2024-06-25 VITALS — BP 110/72 | HR 90 | Ht 61.0 in | Wt 152.0 lb

## 2024-06-25 DIAGNOSIS — E782 Mixed hyperlipidemia: Secondary | ICD-10-CM

## 2024-06-25 DIAGNOSIS — D509 Iron deficiency anemia, unspecified: Secondary | ICD-10-CM | POA: Diagnosis not present

## 2024-06-25 DIAGNOSIS — R0989 Other specified symptoms and signs involving the circulatory and respiratory systems: Secondary | ICD-10-CM

## 2024-06-25 DIAGNOSIS — R42 Dizziness and giddiness: Secondary | ICD-10-CM | POA: Diagnosis not present

## 2024-06-25 DIAGNOSIS — I951 Orthostatic hypotension: Secondary | ICD-10-CM

## 2024-06-25 NOTE — Patient Instructions (Signed)
 Medication Instructions:  Your physician recommends that you continue on your current medications as directed. Please refer to the Current Medication list given to you today.  *If you need a refill on your cardiac medications before your next appointment, please call your pharmacy*  Lab Work: NONE ORDERED If you have labs (blood work) drawn today and your tests are completely normal, you will receive your results only by: MyChart Message (if you have MyChart) OR A paper copy in the mail If you have any lab test that is abnormal or we need to change your treatment, we will call you to review the results.  Testing/Procedures: Your physician has requested that you have a carotid duplex. This test is an ultrasound of the carotid arteries in your neck. It looks at blood flow through these arteries that supply the brain with blood. Allow one hour for this exam. There are no restrictions or special instructions.  Follow-Up: At [facility], you and your health needs are our priority.  As part of our continuing mission to provide you with exceptional heart care, our providers are all part of one team.  This team includes your primary Cardiologist (physician) and Advanced Practice Providers or APPs (Physician Assistants and Nurse Practitioners) who all work together to provide you with the care you need, when you need it.  Your next appointment:   6 month(s)  Provider:   Darryle Decent, MD  We recommend signing up for the patient portal called MyChart.  Sign up information is provided on this After Visit Summary.  MyChart is used to connect with patients for Virtual Visits (Telemedicine).  Patients are able to view lab/test results, encounter notes, upcoming appointments, etc.  Non-urgent messages can be sent to your provider as well.   To learn more about what you can do with MyChart, go to [email or web address].   Other Instructions

## 2024-06-27 ENCOUNTER — Other Ambulatory Visit: Payer: Self-pay | Admitting: Family Medicine

## 2024-06-27 DIAGNOSIS — B027 Disseminated zoster: Secondary | ICD-10-CM

## 2024-07-03 ENCOUNTER — Other Ambulatory Visit: Payer: Self-pay

## 2024-07-05 ENCOUNTER — Ambulatory Visit (HOSPITAL_COMMUNITY)
Admission: RE | Admit: 2024-07-05 | Discharge: 2024-07-05 | Disposition: A | Discharge status: Home / Self Care | Source: Ambulatory Visit | Attending: Physician Assistant | Admitting: Physician Assistant

## 2024-07-05 DIAGNOSIS — R42 Dizziness and giddiness: Secondary | ICD-10-CM | POA: Diagnosis not present

## 2024-07-05 DIAGNOSIS — I951 Orthostatic hypotension: Secondary | ICD-10-CM | POA: Diagnosis not present

## 2024-07-05 DIAGNOSIS — D509 Iron deficiency anemia, unspecified: Secondary | ICD-10-CM | POA: Insufficient documentation

## 2024-07-05 DIAGNOSIS — E782 Mixed hyperlipidemia: Secondary | ICD-10-CM | POA: Insufficient documentation

## 2024-07-05 DIAGNOSIS — R0989 Other specified symptoms and signs involving the circulatory and respiratory systems: Secondary | ICD-10-CM | POA: Diagnosis not present

## 2024-07-07 ENCOUNTER — Ambulatory Visit: Payer: Self-pay | Admitting: Physician Assistant

## 2024-07-09 ENCOUNTER — Ambulatory Visit (HOSPITAL_COMMUNITY): Admission: RE | Admit: 2024-07-09 | Source: Ambulatory Visit

## 2024-07-16 ENCOUNTER — Ambulatory Visit

## 2024-07-16 VITALS — Ht 61.0 in | Wt 150.0 lb

## 2024-07-16 DIAGNOSIS — Z860100 Personal history of colon polyps, unspecified: Secondary | ICD-10-CM

## 2024-07-16 MED ORDER — PEG 3350-KCL-NA BICARB-NACL 420 G PO SOLR: 4000.0000 mL | Freq: Once | ORAL | 0 refills | Status: AC

## 2024-07-17 ENCOUNTER — Encounter: Payer: Self-pay | Admitting: Pediatrics

## 2024-07-30 ENCOUNTER — Encounter: Admitting: Pediatrics

## 2024-08-02 ENCOUNTER — Ambulatory Visit

## 2024-08-06 ENCOUNTER — Ambulatory Visit: Admitting: Family Medicine
